# Patient Record
Sex: Female | Born: 1945
Health system: Southern US, Community
[De-identification: ages and names within clinical notes are randomized; demographics above are authoritative.]

## PROBLEM LIST (undated history)

## (undated) DIAGNOSIS — T7840XA Allergy, unspecified, initial encounter: Secondary | ICD-10-CM

## (undated) DIAGNOSIS — M722 Plantar fascial fibromatosis: Secondary | ICD-10-CM

## (undated) DIAGNOSIS — L410 Pityriasis lichenoides et varioliformis acuta: Secondary | ICD-10-CM

## (undated) DIAGNOSIS — E785 Hyperlipidemia, unspecified: Secondary | ICD-10-CM

## (undated) DIAGNOSIS — F17200 Nicotine dependence, unspecified, uncomplicated: Secondary | ICD-10-CM

## (undated) DIAGNOSIS — I10 Essential (primary) hypertension: Secondary | ICD-10-CM

## (undated) DIAGNOSIS — M1990 Unspecified osteoarthritis, unspecified site: Secondary | ICD-10-CM

## (undated) DIAGNOSIS — I499 Cardiac arrhythmia, unspecified: Secondary | ICD-10-CM

## (undated) HISTORY — DX: Unspecified osteoarthritis, unspecified site: M19.90

## (undated) HISTORY — DX: Nicotine dependence, unspecified, uncomplicated: F17.200

## (undated) HISTORY — DX: Hyperlipidemia, unspecified: E78.5

## (undated) HISTORY — DX: Plantar fascial fibromatosis: M72.2

## (undated) HISTORY — DX: Allergy, unspecified, initial encounter: T78.40XA

## (undated) HISTORY — PX: WISDOM TOOTH EXTRACTION: SHX21

## (undated) HISTORY — DX: Essential (primary) hypertension: I10

## (undated) HISTORY — DX: Pityriasis lichenoides et varioliformis acuta: L41.0

## (undated) HISTORY — PX: ABDOMINAL HYSTERECTOMY: SHX81

---

## 2001-10-03 HISTORY — PX: CHOLECYSTECTOMY: SHX55

## 2008-10-03 HISTORY — PX: COLONOSCOPY: SHX174

## 2011-06-14 ENCOUNTER — Ambulatory Visit (INDEPENDENT_AMBULATORY_CARE_PROVIDER_SITE_OTHER): Payer: BC Managed Care – PPO | Admitting: Medical

## 2011-06-14 ENCOUNTER — Encounter: Payer: Self-pay | Admitting: Medical

## 2011-06-14 VITALS — BP 140/80 | HR 60 | Temp 97.9°F | Resp 20 | Ht 66.0 in | Wt 158.5 lb

## 2011-06-14 DIAGNOSIS — F17200 Nicotine dependence, unspecified, uncomplicated: Secondary | ICD-10-CM

## 2011-06-14 DIAGNOSIS — J40 Bronchitis, not specified as acute or chronic: Secondary | ICD-10-CM

## 2011-06-14 MED ORDER — VARENICLINE TARTRATE 0.5 MG PO TABS: 0.5000 mg | ORAL_TABLET | Freq: Two times a day (BID) | ORAL | Status: DC

## 2011-06-14 MED ORDER — DOXYCYCLINE HYCLATE 100 MG PO TABS: 100.0000 mg | ORAL_TABLET | Freq: Two times a day (BID) | ORAL | Status: AC

## 2011-06-14 NOTE — Patient Instructions (Signed)
Rest, hydrate well, consider OTC Mucinex DM or Coricidin HBP for cough and congestion.  I will have you begin Doxycyline antibiotic, 1 tablet twice daily for 10 days.   Call or return if not improving in 3-5 days.  I would like you to start Chantix to help stop smoking.  Pick a quit date, for example 1 week from now.  Begin Chantix, try and stop smoking on the quit date.  I also recommend you call 1-800-QUIT-NOW hotline which is a free 24/7 counseling service to help stop tobacco.    I want to see you back in 1 month regarding tobacco use.

## 2011-06-14 NOTE — Progress Notes (Signed)
Subjective:     Tonya [name] is a 65 y.o. female who presents for evaluation of chills, myalgias, nasal congestion, productive cough and sneezing.   Onset of symptoms was 5 days ago, and has been gradually worsening since that time. Treatment to date: alka seltzer.  They note hx/o 1-2 colds yearly that usually turn into bronchitis.  Denies sick contacts.  She has used Nasonex in past for allergies.  Denies hx/o COPD or asthma.  No other aggravating or relieving factors.  No other c/o.  The following portions of the patient's history were reviewed and updated as appropriate: allergies, current medications, past family history, past medical history, past social history, past surgical history and problem list.  Past Medical History  Diagnosis Date  . Hypertension   . Tobacco use disorder   . Allergy   . Wears glasses   . Osteoarthritis     knee, hips  . [name] disease     per her report from skin biopsy prior    Review of Systems Constitutional: denies fever, sweats, anorexia Skin: denies rash HEENT:  denies ear pain, itchy watery eyes Cardiovascular: denies chest pain, palpitations Lungs: +mild SOB, +productive sputum; denies wheezing, hemoptysis, orthopnea, PND Abdomen: denies abdominal pain, nausea, vomiting, diarrhea GU: denies dysuria Extremities: denies edema  Objective:   Filed Vitals:   06/14/11 1151  BP: 140/80  Pulse: 60  Temp: 97.9 F (36.6 C)  Resp: 20    General appearance: Alert, WD/WN, no distress,somewhat  ill appearing                             Skin: warm, no rash, no diaphoresis                           Head: no sinus tenderness                            Eyes: conjunctiva normal, corneas clear, PERRLA                            Ears: pearly TMs, external ear canals normal                          Nose: septum midline, turbinates swollen, with erythema and clear discharge             Mouth/throat: MMM, tongue normal, mild pharyngeal  erythema                           Neck: supple, no adenopathy, no thyromegaly, nontender                          Heart: RRR, normal S1, S2, no murmurs                         Lungs: +bronchial breath sounds, +scattered rhonchi, no wheezes, no rales                Extremities: no edema, nontender     Assessment:   Encounter Diagnoses  Name Primary?  . Bronchitis Yes  . Tobacco use disorder      Plan:   Prescription given today for Doxycycline as below.  Discussed  diagnosis and treatment of bronchitis.  Suggested symptomatic OTC remedies for cough and congestion.  Nasal saline spray for nasal congestion.  Tylenol or Ibuprofen OTC for fever and malaise.  Call/return in 2-3 days if symptoms are worse or not improving.  Advised that cough may linger even after the infection is improved.   Tobacco use disorder - she is motivated to quit.  Discussed risks of tobacco use.  She will begin Chantix and I strongly advised her to call toll free help line.  Recheck 57mo.

## 2011-07-13 ENCOUNTER — Ambulatory Visit (INDEPENDENT_AMBULATORY_CARE_PROVIDER_SITE_OTHER): Payer: BC Managed Care – PPO | Admitting: Medical

## 2011-07-13 ENCOUNTER — Encounter: Payer: Self-pay | Admitting: Medical

## 2011-07-13 DIAGNOSIS — Z8249 Family history of ischemic heart disease and other diseases of the circulatory system: Secondary | ICD-10-CM

## 2011-07-13 DIAGNOSIS — F17200 Nicotine dependence, unspecified, uncomplicated: Secondary | ICD-10-CM

## 2011-07-13 DIAGNOSIS — R079 Chest pain, unspecified: Secondary | ICD-10-CM

## 2011-07-13 DIAGNOSIS — I10 Essential (primary) hypertension: Secondary | ICD-10-CM

## 2011-07-13 NOTE — Progress Notes (Signed)
Subjective:    Tonya [name] is a 65 y.o. female who presents for evaluation of chest pain. Onset was 3 days ago. Symptoms have improved since that time. The patient describes the pain as ache and does not radiate.    She went to the gym and walked on the treadmill for a short periods and did some low weight strength training.  Later than evening started having pain under left breast/chest wall.  Though this could be related to a tight sports bra. The pain lasted for hours that night, but since then has gotten brief twinges of the pain at random times.  Sometimes with twisting, sometimes lying on right side.  On Monday however, she didn't feel quite right and had some lightheadedness.    Aggravating factors are: twisting motion, leaning forward, lying on right side. Alleviating factors are: just resolves on its on, most of the time the pains are breif.. Patient's cardiac risk factors are: hypertension, smoking/ tobacco exposure and somewhat sedentary, and age is close to 47.Marland Kitchen Patient's risk factors for DVT/PE: none. Previous cardiac testing: none.  She wants to stop smoking, smokes 1ppd.  Couldn't afford Chantix written last visit.  She also wants referral to eye doctor.  She needs to find a new eye doctor.  The following portions of the patient's history were reviewed and updated as appropriate: allergies, current medications, past family history, past medical history, past social history, past surgical history and problem list.  Past Medical History  Diagnosis Date  . Hypertension   . Tobacco use disorder   . Allergy   . Wears glasses   . Osteoarthritis     knee, hips  . [name] disease     per her report from skin biopsy prior    Review of Systems Constitutional: denies fever, chills, sweats, unexpected weight change, anorexia, fatigue ENT: +runny nose,mild nasal congestion; no ear pain, sore throat Cardiology: denies palpitations, edema, orthopnea, paroxysmal nocturnal  dyspnea Respiratory: denies cough, shortness of breath, dyspnea on exertion, wheezing, hemoptysis Gastroenterology: denies abdominal pain, nausea, vomiting, diarrhea, constipation,  Musculoskeletal: denies arthralgias, myalgias, back pain Urology: denies dysuria Neurology: no headache, weakness, tingling, numbness, speech abnormality     Objective:      Filed Vitals:   07/13/11 1059  BP: 130/72  Pulse: 88  Temp: 98.1 F (36.7 C)  Resp: 18   General appearance: alert, no distress, WD/WN, black female  Skin: no erythema, ecchymosis HEENT: normocephalic, conjunctiva/corneas normal, sclerae anicteric, PERRLA, EOMi, nares patent, no discharge or erythema, pharynx normal Oral cavity: MMM, tongue normal, teeth normal Neck: supple, no lymphadenopathy, no thyromegaly, no JVD, no bruits, no masses, normal ROM Chest: non tender, normal shape and expansion Heart: RRR, normal S1, S2, no murmurs Lungs: CTA bilaterally, no wheezes, rhonchi, or rales Abdomen: +bs, soft, non tender, non distended, no masses, no hepatomegaly, no splenomegaly, no bruits Back: non tender Extremities: no edema, no cyanosis, no clubbing Pulses: 2+ symmetric, upper and lower extremities, normal cap refill Neurological: alert, oriented x 3, CN2-12 intact    Cardiographics ECG: with nsr, 60 bpm, normal intervals, axis -9 degrees, left axis deviation, good R wave progression, no acute changes, no ST changes, no prior to compare.    Imaging Chest x-ray: normal chest x-ray and pending review by radiologist   CXR is malrotated slightly, there is moderate thoracic degenerative changes, but no obvious cardiomegaly, mass, infiltrate or bony abnormality.   Assessment:   Encounter Diagnoses  Name Primary?  . Chest pain Yes  .  Essential hypertension, benign   . Tobacco use disorder   . Family history of hypertension      Plan:  Chest pain - Advised that symptoms suggest a musculoskeletal etiology, but baseline EKG  and CXR done given her hx/o tobacco use, HTN, and age.  CXR unremarkable other than arthritis of T spine.  EKG with no worrisome findings today.   Advised relative rest, stretching, OTC Aleve once to twice daily prn, heat pad if desired.  Advised that once this resolves, to begin back exercising within 2 weeks.  Discussed signs of acute coronary syndrome that would prompt urgent evaluation.    HTN - controlled on current medication  Tobacco use - advised she consider 1-800-QUIT-NOW for free counseling and nicotine replacement patch.  Advised she try and stop smoking.  Family hx/o HTN.  We will refer to ophthalmology.

## 2011-07-19 ENCOUNTER — Telehealth: Payer: Self-pay | Admitting: Medical

## 2011-07-19 NOTE — Telephone Encounter (Signed)
DONE

## 2011-07-22 ENCOUNTER — Telehealth: Payer: Self-pay | Admitting: Family Medicine

## 2011-07-22 NOTE — Telephone Encounter (Signed)
Message copied by Janeice Robinson on Fri Jul 22, 2011  1:26 PM ------      Message from: [place], DAVID S      Created: Wed Jul 20, 2011  5:05 PM       1) chest x-ray over read as normal.  See if she is feeling better.            2) once again recommend she call 1 800 quit now for smoking cessation            3) make sure we referred her to ophthalmology

## 2011-11-08 ENCOUNTER — Ambulatory Visit (INDEPENDENT_AMBULATORY_CARE_PROVIDER_SITE_OTHER): Payer: BC Managed Care – PPO | Admitting: Medical

## 2011-11-08 ENCOUNTER — Encounter: Payer: Self-pay | Admitting: Medical

## 2011-11-08 VITALS — BP 112/78 | HR 72 | Temp 98.2°F | Resp 16 | Wt 156.0 lb

## 2011-11-08 DIAGNOSIS — Z23 Encounter for immunization: Secondary | ICD-10-CM | POA: Insufficient documentation

## 2011-11-08 DIAGNOSIS — Z1211 Encounter for screening for malignant neoplasm of colon: Secondary | ICD-10-CM

## 2011-11-08 DIAGNOSIS — I10 Essential (primary) hypertension: Secondary | ICD-10-CM

## 2011-11-08 DIAGNOSIS — R0989 Other specified symptoms and signs involving the circulatory and respiratory systems: Secondary | ICD-10-CM

## 2011-11-08 DIAGNOSIS — R0683 Snoring: Secondary | ICD-10-CM | POA: Insufficient documentation

## 2011-11-08 DIAGNOSIS — G478 Other sleep disorders: Secondary | ICD-10-CM

## 2011-11-08 DIAGNOSIS — G479 Sleep disorder, unspecified: Secondary | ICD-10-CM | POA: Insufficient documentation

## 2011-11-08 DIAGNOSIS — Z1239 Encounter for other screening for malignant neoplasm of breast: Secondary | ICD-10-CM | POA: Insufficient documentation

## 2011-11-08 DIAGNOSIS — R0609 Other forms of dyspnea: Secondary | ICD-10-CM

## 2011-11-08 DIAGNOSIS — F17200 Nicotine dependence, unspecified, uncomplicated: Secondary | ICD-10-CM

## 2011-11-08 LAB — LIPID PANEL
HDL: 39 mg/dL — ABNORMAL LOW (ref >39)
LDL Cholesterol: 97 mg/dL (ref 0–99)
Total CHOL/HDL Ratio: 4 Ratio
Triglycerides: 93 mg/dL (ref <150)
VLDL: 19 mg/dL (ref 0–40)

## 2011-11-08 LAB — CBC WITH DIFFERENTIAL/PLATELET
Basophils Absolute: 0 10*3/uL (ref 0.0–0.1)
Eosinophils Relative: 2 % (ref 0–5)
HCT: 43.2 % (ref 36.0–46.0)
Hemoglobin: 14 g/dL (ref 12.0–15.0)
Lymphocytes Relative: 31 % (ref 12–46)
Lymphs Abs: 3 10*3/uL (ref 0.7–4.0)
MCV: 96.4 fL (ref 78.0–100.0)
Monocytes Absolute: 0.6 10*3/uL (ref 0.1–1.0)
Monocytes Relative: 6 % (ref 3–12)
Neutro Abs: 5.8 10*3/uL (ref 1.7–7.7)
RBC: 4.48 MIL/uL (ref 3.87–5.11)
RDW: 13.8 % (ref 11.5–15.5)
WBC: 9.5 10*3/uL (ref 4.0–10.5)

## 2011-11-08 LAB — POCT URINALYSIS DIPSTICK
Bilirubin, UA: NEGATIVE
Blood, UA: NEGATIVE
Leukocytes, UA: NEGATIVE
Nitrite, UA: NEGATIVE
Protein, UA: NEGATIVE
pH, UA: 5

## 2011-11-08 LAB — COMPREHENSIVE METABOLIC PANEL
Alkaline Phosphatase: 50 U/L (ref 39–117)
BUN: 19 mg/dL (ref 6–23)
Creat: 0.71 mg/dL (ref 0.50–1.10)
Glucose, Bld: 106 mg/dL — ABNORMAL HIGH (ref 70–99)
Total Bilirubin: 0.7 mg/dL (ref 0.3–1.2)

## 2011-11-08 MED ORDER — INFLUENZA VIRUS VACCINE SPLIT IM INJ: 0.5000 mL | INJECTION | Freq: Once | INTRAMUSCULAR | Status: DC

## 2011-11-08 MED ORDER — VALSARTAN-HYDROCHLOROTHIAZIDE 320-12.5 MG PO TABS: 1.0000 | ORAL_TABLET | Freq: Every day | ORAL | Status: DC

## 2011-11-08 MED ORDER — INFLUENZA VIRUS VACC SPLIT PF IM SUSP: 0.5000 mL | INTRAMUSCULAR | Status: AC

## 2011-11-08 NOTE — Progress Notes (Signed)
Subjective:   HPI  Tonya [name] is a 66 y.o. female who presents for routine f/u and med check.  She needs refills on her BP medication.  She is fasting for labs today.  She has been compliant with medication, BPs been running normal, no c/o.  She is still smoking, hasn't called the 1-800-QUIT-Now hotline, not ready to quit.  She recently received notice from her prior gastroenterologist that she is due for repeat colonoscopy.  Last colonoscopy about 3 years ago, but due for f/u given hx/o polyps.   Needs referral to GI here in town.  She would also like referral to gynecology here locally for pap smear, mammogram, routine gyn screening.  Prefers gynecology.  She notes concern for sleep apnea.  Her relatives that stay with her say she snores a lot.  She denies rested feeling in the mornings, and does get some day time somnolence.  She will wake her self sometimes from sleep.  Wants sleep study.  No other aggravating or relieving factors.    No other c/o.  The following portions of the patient's history were reviewed and updated as appropriate: allergies, current medications, past family history, past medical history, past social history, past surgical history and problem list.  Past Medical History  Diagnosis Date  . Hypertension   . Tobacco use disorder   . Allergy   . Wears glasses   . Osteoarthritis     knee, hips  . [name] disease     per her report from skin biopsy prior    Allergies  Allergen Reactions  . Penicillins     No current outpatient prescriptions on file prior to visit.   No current facility-administered medications on file prior to visit.    Review of Systems Constitutional: denies fever, chills, sweats, unexpected weight change, fatigue ENT: no runny nose, ear pain, sore throat Cardiology: denies chest pain, palpitations, edema Respiratory: denies cough, shortness of breath, wheezing Gastroenterology: denies abdominal pain, nausea, vomiting, diarrhea,  constipation  Urology: denies dysuria, difficulty urinating, hematuria, urinary frequency, urgency Neurology: no headache, weakness, tingling, numbness      Objective:   Physical Exam  General appearance: alert, no distress, WD/WN HEENT: normocephalic, sclerae anicteric, TMs pearly, nares patent, no discharge or erythema, pharynx normal Oral cavity: MMM, no lesions Neck: supple, no lymphadenopathy, no thyromegaly, no masses Heart: RRR, normal S1, S2, no murmurs Lungs: CTA bilaterally, no wheezes, rhonchi, or rales Abdomen: +bs, soft, non tender, non distended, no masses, no hepatomegaly, no splenomegaly Pulses: 2+ symmetric, upper and lower extremities, normal cap refill   Assessment and Plan :     Encounter Diagnoses  Name Primary?  . Essential hypertension, benign Yes  . Need for prophylactic vaccination and inoculation against influenza   . Tobacco use disorder   . Snoring   . Sleep disturbance    HTN - controlled on current medication.  Refills today, labs today.  Flu vaccine, VIS and vaccine counseling today.  Tobacco use disorder - not ready to quit.  Counseling today.  Snoring, sleep disturbance - discussed screening/testing.  She agrees to in home screen instead of formal sleep study.  Referral for [facility] for in home screen for sleep apnea.   Referrals to gynecology for routine pap/mammogram, referral today for repeat screening colonoscopy.

## 2011-11-18 ENCOUNTER — Other Ambulatory Visit: Payer: Self-pay | Admitting: Obstetrics and Gynecology

## 2011-11-18 DIAGNOSIS — Z1231 Encounter for screening mammogram for malignant neoplasm of breast: Secondary | ICD-10-CM

## 2011-12-06 ENCOUNTER — Ambulatory Visit
Admission: RE | Admit: 2011-12-06 | Discharge: 2011-12-06 | Disposition: A | Discharge status: Home / Self Care | Payer: BC Managed Care – PPO | Source: Ambulatory Visit | Attending: Obstetrics and Gynecology | Admitting: Obstetrics and Gynecology

## 2011-12-06 DIAGNOSIS — Z1231 Encounter for screening mammogram for malignant neoplasm of breast: Secondary | ICD-10-CM

## 2011-12-08 ENCOUNTER — Other Ambulatory Visit: Payer: Self-pay | Admitting: Obstetrics and Gynecology

## 2011-12-08 DIAGNOSIS — R928 Other abnormal and inconclusive findings on diagnostic imaging of breast: Secondary | ICD-10-CM

## 2011-12-12 ENCOUNTER — Telehealth: Payer: Self-pay | Admitting: Family Medicine

## 2011-12-12 NOTE — Telephone Encounter (Signed)
PATIENT WAS NOTIFIED THAT SHE NEEDS TO COME BY FOR AN OV TO DISCUSS SLEEP STUDY. PATIENT SCHEDULED HER APPOINTMENT AND WILL BE SEEN NEXT WEEK. CLS

## 2011-12-12 NOTE — Telephone Encounter (Signed)
Message copied by Janeice Robinson on Mon Dec 12, 2011  2:51 PM ------      Message from: Jac Canavan      Created: Mon Dec 12, 2011  2:34 PM       Overnight in home sleep study was abnormal.  Lets have her f/u OV to discuss, and to check A1C due to recent glucose being elevated.

## 2011-12-15 ENCOUNTER — Ambulatory Visit
Admission: RE | Admit: 2011-12-15 | Discharge: 2011-12-15 | Disposition: A | Discharge status: Home / Self Care | Payer: BC Managed Care – PPO | Source: Ambulatory Visit | Attending: Obstetrics and Gynecology | Admitting: Obstetrics and Gynecology

## 2011-12-15 DIAGNOSIS — R928 Other abnormal and inconclusive findings on diagnostic imaging of breast: Secondary | ICD-10-CM

## 2011-12-20 ENCOUNTER — Encounter: Payer: Self-pay | Admitting: Medical

## 2011-12-20 ENCOUNTER — Ambulatory Visit (INDEPENDENT_AMBULATORY_CARE_PROVIDER_SITE_OTHER): Payer: BC Managed Care – PPO | Admitting: Medical

## 2011-12-20 VITALS — BP 130/80 | HR 76 | Temp 98.0°F | Resp 16 | Wt 161.0 lb

## 2011-12-20 DIAGNOSIS — G4730 Sleep apnea, unspecified: Secondary | ICD-10-CM

## 2011-12-20 DIAGNOSIS — M1990 Unspecified osteoarthritis, unspecified site: Secondary | ICD-10-CM

## 2011-12-20 NOTE — Patient Instructions (Signed)
Joint pains -  Begin OTC Acetaminophen/Tylenol extra strength 2-3 times daily or Aleve once to twice daily for arthritis pain.   You can also consider Capsaicin cream topically for the knees and hips.

## 2011-12-20 NOTE — Progress Notes (Signed)
Subjective:   HPI  Tonya [name] is a 66 y.o. female who presents for recheck.  I saw her recently for several issues.  She is here primarily for f/u on in home overnight sleep study.  She did recently have an abnormal mammogram, but on recheck in comparison to old mammograms, she knows the diagnosis was fibrocystic breast disease and nothing worrisome.  She does report loud snoring, sometimes does not wake rested, has some daytime somnolence.  She also complains of right knee and hip arthritis.  She has been on Celebrex in the past, wants my opinion what she can take for this.  No other aggravating or relieving factors.    No other c/o.  The following portions of the patient's history were reviewed and updated as appropriate: allergies, current medications, past family history, past medical history, past social history, past surgical history and problem list.  Past Medical History  Diagnosis Date  . Hypertension   . Tobacco use disorder   . Allergy   . Wears glasses   . Osteoarthritis     knee, hips  . [name] disease     per her report from skin biopsy prior    Allergies  Allergen Reactions  . Penicillins      Review of Systems ROS reviewed and was negative other than noted in HPI or above.    Objective:   Physical Exam  General appearance: alert, no distress, WD/WN Oral cavity: MMM, no lesions Neck: supple, no lymphadenopathy, no thyromegaly, no masses Heart: RRR, normal S1, S2, no murmurs Lungs: CTA bilaterally, no wheezes, rhonchi, or rales Pulses: 2+ symmetric   Assessment and Plan :     Encounter Diagnoses  Name Primary?  . Sleep apnea Yes  . Osteoarthritis    Sleep apnea - we reviewed her recent in home sleep study results which shows several events of hypoxia. Epworth sleep scale-8.   We will set her back up with [facility] home health for CPAP titration and supplies to begin using CPAP.  I also recommend she try lose a little weight, and we once again  discussed sleep hygiene.  Osteoarthritis-she will try over-the-counter Aleve or, arthritis when necessary, preferably not daily.  She can also consider over-the-counter capsaicin topically  Recheck 67mo.

## 2012-01-25 ENCOUNTER — Encounter (HOSPITAL_BASED_OUTPATIENT_CLINIC_OR_DEPARTMENT_OTHER): Payer: BC Managed Care – PPO

## 2012-02-03 ENCOUNTER — Ambulatory Visit (HOSPITAL_BASED_OUTPATIENT_CLINIC_OR_DEPARTMENT_OTHER): Payer: BC Managed Care – PPO | Attending: Medical

## 2012-02-03 VITALS — Ht 66.0 in | Wt 160.0 lb

## 2012-02-03 DIAGNOSIS — G4733 Obstructive sleep apnea (adult) (pediatric): Secondary | ICD-10-CM | POA: Insufficient documentation

## 2012-02-12 DIAGNOSIS — G4733 Obstructive sleep apnea (adult) (pediatric): Secondary | ICD-10-CM

## 2012-02-12 NOTE — Procedures (Signed)
NAMEJAYONA, MCCAIG              ACCOUNT NO.:  [id number]  MEDICAL RECORD NO.:  [id number]          PATIENT TYPE:  OUT  LOCATION:  SLEEP CENTER                 FACILITY:  [facility]  PHYSICIAN:  Shianna Bally D. Maple Hudson, MD, FCCP, FACPDATE OF BIRTH:  05[birth year]  DATE OF STUDY:  02/03/2012                           NOCTURNAL POLYSOMNOGRAM  REFERRING PHYSICIAN:  Crosby Oyster, PA  REFERRING:  Crosby Oyster, PA  INDICATION FOR STUDY:  Hypersomnia with sleep apnea.  EPWORTH SLEEPINESS SCORE:  5/24.  BMI 25.8, weight 160 pounds, height 66 inches, and neck 14 inches.  MEDICATIONS:  Home medications are charted and reviewed.  SLEEP ARCHITECTURE:  Total sleep time 319 minutes with sleep efficiency 72.3%.  Stage I was 7.2%, stage II 71.8%, stage III absent, and REM 21% of total sleep time.  Sleep latency 38.5 minutes, REM latency 238.5 minutes, awake after sleep onset 83.5 minutes, and arousal index 19.4.  Bedtime medication:  None.  RESPIRATORY DATA:  Apnea-hypopnea index (AHI) 7.3 per hour.  A total of 39 events were scored including 11 obstructive apneas, 2 central apneas, and 26 hypopneas.  Events were not positional.  REM AHI 10.7 per hour. There were insufficient numbers of events to meet protocol requirements for initiation of split protocol CPAP titration on this study night.  OXYGEN DATA:  Moderate snoring with oxygen desaturation to a nadir of 86% and mean oxygen saturation through the study of 92% on room air.  CARDIAC DATA:  Normal sinus rhythm.  MOVEMENT-PARASOMNIA:  No significant movement disturbance.  Bathroom x1.  IMPRESSIONS-RECOMMENDATION: 1. Mild obstructive sleep apnea/hypopnea syndrome, apnea-hypopnea     index 7.3 per hour with non-positional events.  Moderate snoring     with oxygen desaturation to a nadir of 86% and mean oxygen     saturation through the study of 92% on room air. 2. There were insufficient numbers of events to meet protocol     requirements for  initiation of split protocol continuous positive     airway pressure titration on this study night.  Scores in this     range are usually managed     with conservative therapy including attention to nasal and upper     airway anatomic obstruction if appropriate.  Return for dedicated     CPAP titration study can be done if clinically necessary.     Prisma Decarlo D. Maple Hudson, MD, [facility], FACP Diplomate, American Board of Sleep Medicine    CDY/MEDQ  D:  02/12/2012 08:52:11  T:  02/12/2012 10:13:51  Job:  [id number]

## 2012-02-29 ENCOUNTER — Ambulatory Visit (INDEPENDENT_AMBULATORY_CARE_PROVIDER_SITE_OTHER): Payer: BC Managed Care – PPO | Admitting: Medical

## 2012-02-29 ENCOUNTER — Encounter: Payer: Self-pay | Admitting: Medical

## 2012-02-29 VITALS — BP 130/80 | HR 60 | Temp 98.2°F | Resp 16 | Wt 162.0 lb

## 2012-02-29 DIAGNOSIS — I10 Essential (primary) hypertension: Secondary | ICD-10-CM

## 2012-03-02 ENCOUNTER — Ambulatory Visit (INDEPENDENT_AMBULATORY_CARE_PROVIDER_SITE_OTHER): Payer: BC Managed Care – PPO | Admitting: Medical

## 2012-03-02 ENCOUNTER — Encounter: Payer: Self-pay | Admitting: Medical

## 2012-03-02 VITALS — BP 130/80 | HR 60 | Temp 101.3°F | Resp 16 | Wt 162.0 lb

## 2012-03-02 DIAGNOSIS — J189 Pneumonia, unspecified organism: Secondary | ICD-10-CM

## 2012-03-02 DIAGNOSIS — R52 Pain, unspecified: Secondary | ICD-10-CM

## 2012-03-02 DIAGNOSIS — G4730 Sleep apnea, unspecified: Secondary | ICD-10-CM

## 2012-03-02 DIAGNOSIS — R05 Cough: Secondary | ICD-10-CM

## 2012-03-02 DIAGNOSIS — M25571 Pain in right ankle and joints of right foot: Secondary | ICD-10-CM

## 2012-03-02 DIAGNOSIS — R059 Cough, unspecified: Secondary | ICD-10-CM

## 2012-03-02 DIAGNOSIS — R509 Fever, unspecified: Secondary | ICD-10-CM

## 2012-03-02 DIAGNOSIS — M25579 Pain in unspecified ankle and joints of unspecified foot: Secondary | ICD-10-CM

## 2012-03-02 LAB — CBC WITH DIFFERENTIAL/PLATELET
HCT: 39.5 % (ref 36.0–46.0)
Hemoglobin: 12.7 g/dL (ref 12.0–15.0)
Lymphocytes Relative: 16 % (ref 12–46)
Lymphs Abs: 2.3 10*3/uL (ref 0.7–4.0)
MCV: 96 fL (ref 78.0–100.0)
Monocytes Absolute: 0.7 10*3/uL (ref 0.1–1.0)
Monocytes Relative: 5 % (ref 3–12)
Neutro Abs: 11.1 10*3/uL — ABNORMAL HIGH (ref 1.7–7.7)
RBC: 4.11 MIL/uL (ref 3.87–5.11)
WBC: 14.1 10*3/uL — ABNORMAL HIGH (ref 4.0–10.5)

## 2012-03-02 MED ORDER — LEVOFLOXACIN 500 MG PO TABS: 500.0000 mg | ORAL_TABLET | Freq: Every day | ORAL | Status: AC

## 2012-03-02 NOTE — Progress Notes (Signed)
  Subjective:   HPI  Tonya [name] is a 66 y.o. female who presents for multiple c/o.  She originally came in Wednesday with ankle c/o only, but rescheduled for today given time restraints Wednesday.   She repots turning right ankle a week or so ago while at hte gym, but in the last few days has felt much better. Still a little sore on the inside of the ankle at the bone, but no swelling.  Wants to know when she can return to exercise.  She has had a cold the last week with cough, sinus pressure, but in the last 24 hours notes aching all over, very tired, feels sick.  Denies sick contacts, no fever, no sore throat, ear pain, NVD, wheezing.  Feels flu like.   She is also here in f/u from her recent sleep study.    No other c/o.  The following portions of the patient's history were reviewed and updated as appropriate: allergies, current medications, past family history, past medical history, past social history, past surgical history and problem list.  Past Medical History  Diagnosis Date  . Hypertension   . Tobacco use disorder   . Allergy   . Wears glasses   . Osteoarthritis     knee, hips  . [name] disease     per her report from skin biopsy prior    Allergies  Allergen Reactions  . Penicillins      Review of Systems ROS reviewed and was negative other than noted in HPI or above.    Objective:   Physical Exam  General appearance: alert, no distress, WD/WN, ill appearing Skin: hot to touch HEENT: normocephalic, sclerae anicteric, TMs pearly, nares patent, no discharge or erythema, pharynx normal Oral cavity: MMM, no lesions Neck: supple, no lymphadenopathy, no thyromegaly, no masses Heart: RRR, normal S1, S2, no murmurs Lungs: decreased breath sounds in general, no obvious wheezes, rhonchi, or rales Abdomen: +bs, soft, non tender, non distended, no masses, no hepatomegaly, no splenomegaly MSK: right ankle nontender, normal ROM, no obvious deformity or  swelling Pulses: 2+ symmetric, upper and lower extremities, normal cap refill Neuro: ankle intact   Assessment and Plan :     Encounter Diagnoses  Name Primary?  . Pneumonia Yes  . Fever   . Body aches   . Cough   . Ankle pain, right   . Sleep apnea    Pneumonia - CXR today suggestive of lobar pneumonia right upper and lower lobes.  CXR reviewed by Dr. Susann Givens as well, supervising physician. Discussed inpatient vs outpatient treatment.  She would rather try outpatient therapy.  Of note, she takes care of her young granddaughter.  We will treat with Levaquin, rest, good hydration, and f/u 1wk.  However, if worse in the meantime, go to the hospital.  She will have close by family keep an eye on her this weekend.  Ankle pain - resolving sprain.  Advised she gradually resume physical activity and her exercise regimen once the pneumonia has resolved and we have recheck here in 1-2 wk.  Sleep apnea - discussed the recent sleep study which showed only mild apnea.  She will consider options for therapy.  We discussed options including mouthguard, CPAP, raising head of the bed.    RTC 1wk.

## 2012-03-02 NOTE — Patient Instructions (Signed)
Sleep Apnea Sleep apnea is a common disorder. The main problem of this disorder is excessive daytime sleepiness and compromised quality of life. This may include social and emotional problems. There are two types of sleep apnea.  Obstructive sleep apnea is when breathing stops due to a blocked airway.   Central sleep apnea is a malfunction of the brain's normal signal to breathe.  SYMPTOMS  Restless sleep.   Falling asleep while driving and/or during the day.   Loss of energy.   Irritability.   Mood or behavior changes.   Loud, heavy snoring.   Morning headaches.   Trouble concentrating.   Forgetfulness.   Anxiety or depression.   Decreased interest in sex.  Not all people with sleep apnea have all of these symptoms. However, people who have a few of these symptoms should visit their caregiver for an evaluation. Problems related to untreated sleep apnea include:  High blood pressure (hypertension).   Coronary artery disease.   Impotence.   Cognitive dysfunction.   Memory loss.  TREATMENT  For mild cases, treatment may include avoiding sleeping on one's back.   For people with nasal congestion, a decongestant may be prescribed.   Patients with obstructive and central apnea should avoid depressants. This includes alcohol, sedatives and narcotics. Weight loss and diet control are encouraged for overweight patients.   Many serious cases of obstructive sleep apnea can be relieved by a treatment called nasal continuous positive airway pressure (nasal CPAP). Nasal CPAP uses a mask-like device and pump that work together to keep the airway open. The pump delivers air pressure during each breath.   Surgery may help some patients by stopping or reducing the narrowing of the airway due to anatomical defects.  PROGNOSIS  Removing the obstruction usually reverses hypertension and cardiac problems. Untreated, sleep apnea sufferers have a tendency to fall asleep during the day.  This is can result in serious accident or loss of ones job. RESEARCH Sleep apnea is currently one of the most active areas of sleep research.  Document Released: 09/09/2002 Document Revised: 09/08/2011 Document Reviewed: 01/05/2006 [employer] Patient Information 2012 [employer]. 

## 2012-03-13 NOTE — Progress Notes (Signed)
Document opened in error, no visit.

## 2012-03-16 ENCOUNTER — Ambulatory Visit (INDEPENDENT_AMBULATORY_CARE_PROVIDER_SITE_OTHER): Payer: BC Managed Care – PPO | Admitting: Medical

## 2012-03-16 ENCOUNTER — Encounter: Payer: Self-pay | Admitting: Medical

## 2012-03-16 VITALS — BP 110/68 | HR 64 | Temp 98.2°F | Resp 16 | Wt 158.0 lb

## 2012-03-16 DIAGNOSIS — M899 Disorder of bone, unspecified: Secondary | ICD-10-CM

## 2012-03-16 DIAGNOSIS — M2550 Pain in unspecified joint: Secondary | ICD-10-CM

## 2012-03-16 DIAGNOSIS — J189 Pneumonia, unspecified organism: Secondary | ICD-10-CM

## 2012-03-16 DIAGNOSIS — M8580 Other specified disorders of bone density and structure, unspecified site: Secondary | ICD-10-CM

## 2012-03-16 DIAGNOSIS — R918 Other nonspecific abnormal finding of lung field: Secondary | ICD-10-CM

## 2012-03-16 DIAGNOSIS — F17200 Nicotine dependence, unspecified, uncomplicated: Secondary | ICD-10-CM

## 2012-03-16 DIAGNOSIS — R9389 Abnormal findings on diagnostic imaging of other specified body structures: Secondary | ICD-10-CM

## 2012-03-16 MED ORDER — NAPROXEN SODIUM ER 375 MG PO TB24: ORAL_TABLET | ORAL | Status: DC

## 2012-03-16 NOTE — Progress Notes (Signed)
Subjective: Here for recheck.  I saw her about 2 wk ago for pneumonia.  She notes that she took the antibiotic and after about 4 days felt significantly better.  However, she then started feeling worse again.  Started having allergy symptoms, runny nose, sneezing, occasional cough.  Denies fever, chills, sweats, NVD.  Not using anything for the symptoms.   She currently denies cough, SOB, wheezing, or chest pain.  After she had felt better day 4-5 of the antibiotic for pneumonia, she went out in the garden and was doing work around the house.  This aggravated her knees.  She notes  Hx/o arthritis in her knees and hands.  Currently they have been giving her trouble.  She has tried numerous OTC NSAIDs including ibuprofen, Tylenol.   She is here for f/u on the ponunmoian and abnormal xray.  No other new c/o.   Past Medical History  Diagnosis Date  . Hypertension   . Tobacco use disorder   . Allergy   . Wears glasses   . Osteoarthritis     knee, hips  . [name] disease     per her report from skin biopsy prior     Objective:   Physical Exam  Filed Vitals:   03/16/12 1032  BP: 110/68  Pulse: 64  Temp: 98.2 F (36.8 C)  Resp: 16    General appearance: alert, no distress, WD/WN, lean black female HEENT: normocephalic, sclerae anicteric, TMs pearly, nares patent, no discharge or erythema, pharynx normal Oral cavity: MMM, no lesions Neck: supple, no lymphadenopathy, no thyromegaly, no masses Heart: RRR, normal S1, S2, no murmurs Lungs: CTA bilaterally, no wheezes, rhonchi, or rales Abdomen: +bs, soft, non tender, non distended, no masses, no hepatomegaly, no splenomegaly MSK: nontender UE and LE,no obvious deformity, normal ROM Pulses: 2+ symmetric, upper and lower extremities, normal cap refill   Assessment and Plan :    Encounter Diagnoses  Name Primary?  . Pneumonia Yes  . Osteopenia   . Abnormal chest x-ray   . Tobacco use disorder   . Joint pain    Pneumonia -  much improved.  Osteopenia - noted on recent CXR.   Will set up for bone density scan.  Advised OTC Ca+ Vit D.  Abnormal Chest Xray - repeat in 2 wk.  Order given  Tobacco use disorder - recommended 1-800-QUIT-now, use efforts to stop, consider medications.  Joint pain - trial of Naprelan once daily prn, relative rest.

## 2012-03-16 NOTE — Patient Instructions (Addendum)
Begin 81mg  Aspirin nightly for heart protection and arthritis pain  If needed, you can try the Naprelan 375mg  once daily pill I gave as samples today for arthritis pain.  We will set up an appointment for bone density scan.   Go for repeat chest xray in 2 weeks.   YOU CAN QUIT SMOKING!  Talk to your medical provider about using medicines to help you quit. These include nicotine replacement gum, lozenges, or skin patches.  Consider calling 1-800-QUIT-NOW, a toll free 24/7 hotline with free counseling to help you quit.  If you are ready to quit smoking or are thinking about it, congratulations! You have chosen to help yourself be healthier and live longer! There are lots of different ways to quit smoking. Nicotine gum, nicotine patches, a nicotine inhaler, or nicotine nasal spray can help with physical craving. Hypnosis, support groups, and medicines help break the habit of smoking. TIPS TO GET OFF AND STAY OFF CIGARETTES  Learn to predict your moods. Do not let a bad situation be your excuse to have a cigarette. Some situations in your life might tempt you to have a cigarette.   Ask friends and co-workers not to smoke around you.   Make your home smoke-free.   Never have "just one" cigarette. It leads to wanting another and another. Remind yourself of your decision to quit.   On a card, make a list of your reasons for not smoking. Read it at least the same number of times a day as you have a cigarette. Tell yourself everyday, "I do not want to smoke. I choose not to smoke."   Ask someone at home or work to help you with your plan to quit smoking.   Have something planned after you eat or have a cup of coffee. Take a walk or get other exercise to perk you up. This will help to keep you from overeating.   Try a relaxation exercise to calm you down and decrease your stress. Remember, you may be tense and nervous the first two weeks after you quit. This will pass.   Find new activities to  keep your hands busy. Play with a pen, coin, or rubber band. Doodle or draw things on paper.   Brush your teeth right after eating. This will help cut down the craving for the taste of tobacco after meals. You can try mouthwash too.   Try gum, breath mints, or diet candy to keep something in your mouth.  IF YOU SMOKE AND WANT TO QUIT:  Do not stock up on cigarettes. Never buy a carton. Wait until one pack is finished before you buy another.   Never carry cigarettes with you at work or at home.   Keep cigarettes as far away from you as possible. Leave them with someone else.   Never carry matches or a lighter with you.   Ask yourself, "Do I need this cigarette or is this just a reflex?"   Bet with someone that you can quit. Put cigarette money in a piggy bank every morning. If you smoke, you give up the money. If you do not smoke, by the end of the week, you keep the money.   Keep trying. It takes 21 days to change a habit!  Document Released: 07/16/2009 Document Revised: 06/01/2011 Document Reviewed: 07/16/2009 [facility] Patient Information 2012 [place], [place].

## 2012-03-17 LAB — VITAMIN D 25 HYDROXY (VIT D DEFICIENCY, FRACTURES): Vit D, 25-Hydroxy: 39 ng/mL (ref 30–89)

## 2012-03-30 ENCOUNTER — Ambulatory Visit
Admission: RE | Admit: 2012-03-30 | Discharge: 2012-03-30 | Disposition: A | Discharge status: Home / Self Care | Payer: BC Managed Care – PPO | Source: Ambulatory Visit | Attending: Medical | Admitting: Medical

## 2012-03-30 DIAGNOSIS — F17200 Nicotine dependence, unspecified, uncomplicated: Secondary | ICD-10-CM

## 2012-03-30 DIAGNOSIS — R9389 Abnormal findings on diagnostic imaging of other specified body structures: Secondary | ICD-10-CM

## 2012-03-30 DIAGNOSIS — J189 Pneumonia, unspecified organism: Secondary | ICD-10-CM

## 2012-03-30 NOTE — Addendum Note (Signed)
Addended by: Janeice Robinson on: 03/30/2012 09:27 AM   Modules accepted: Orders

## 2012-04-02 ENCOUNTER — Ambulatory Visit
Admission: RE | Admit: 2012-04-02 | Discharge: 2012-04-02 | Disposition: A | Discharge status: Home / Self Care | Payer: BC Managed Care – PPO | Source: Ambulatory Visit | Attending: Medical | Admitting: Medical

## 2012-04-02 DIAGNOSIS — M8580 Other specified disorders of bone density and structure, unspecified site: Secondary | ICD-10-CM

## 2012-04-10 ENCOUNTER — Encounter: Payer: Self-pay | Admitting: Internal Medicine

## 2012-04-10 ENCOUNTER — Encounter: Payer: Self-pay | Admitting: Medical

## 2012-04-26 ENCOUNTER — Telehealth: Payer: Self-pay | Admitting: Medical

## 2012-04-27 ENCOUNTER — Other Ambulatory Visit: Payer: Self-pay | Admitting: Medical

## 2012-04-27 MED ORDER — NAPROXEN SODIUM ER 375 MG PO TB24: ORAL_TABLET | ORAL | Status: DC

## 2012-04-27 NOTE — Telephone Encounter (Signed)
LM

## 2012-05-01 ENCOUNTER — Other Ambulatory Visit: Payer: Self-pay | Admitting: Family Medicine

## 2012-05-01 MED ORDER — NAPROXEN SODIUM ER 375 MG PO TB24: ORAL_TABLET | ORAL | Status: AC

## 2012-06-19 ENCOUNTER — Encounter: Payer: Self-pay | Admitting: Medical

## 2012-06-26 ENCOUNTER — Other Ambulatory Visit: Payer: Self-pay | Admitting: Obstetrics and Gynecology

## 2012-10-05 ENCOUNTER — Encounter: Payer: Self-pay | Admitting: Medical

## 2012-10-05 ENCOUNTER — Ambulatory Visit (INDEPENDENT_AMBULATORY_CARE_PROVIDER_SITE_OTHER): Payer: Medicare HMO | Admitting: Medical

## 2012-10-05 VITALS — BP 130/70 | HR 60 | Temp 98.1°F | Resp 16 | Wt 163.0 lb

## 2012-10-05 DIAGNOSIS — K639 Disease of intestine, unspecified: Secondary | ICD-10-CM

## 2012-10-05 DIAGNOSIS — Z2911 Encounter for prophylactic immunotherapy for respiratory syncytial virus (RSV): Secondary | ICD-10-CM

## 2012-10-05 DIAGNOSIS — Z23 Encounter for immunization: Secondary | ICD-10-CM

## 2012-10-05 DIAGNOSIS — K5900 Constipation, unspecified: Secondary | ICD-10-CM

## 2012-10-05 NOTE — Patient Instructions (Signed)
To help your bowel movements, increase daily fiber.   It is recommended to get approximately 20 grams of fiber daily  Examples would be a daily fiber supplement of psyllium or chia or flaxseed added to a beverage or food daily  Also, increased whole grain consumption, green leafy vegetables, water intake, and fruits with skins such as apples and pears.  One other consideration if these measure don't help completely would be to add Miralax powder, 1 cap full daily.

## 2012-10-05 NOTE — Progress Notes (Signed)
Subjective: Here for bowel issue.  For the past few months having issues with loose bowels.  Started a few weeks before she went for colonoscopy. Saw Dr. Loreta Ave, ended up having some polyps.  No other issues.  She notes having about 4 BMs daily, mostly soft but somewhat loose stools daily.  No hard or painful bowel movements.  No blood or mucous in stool.   She doesn't drink a lot of water.  She drinks a lot of juice, apple juice, grape juice, some grains, decent amount of vegetables.    She also wants shingles vaccine.  No other c/o.  Past Medical History  Diagnosis Date  . Hypertension   . Tobacco use disorder   . Allergy   . Wears glasses   . Osteoarthritis     knee, hips  . [name] disease     per her report from skin biopsy prior   ROS as in subjective    Objective:   Physical Exam  Filed Vitals:   10/05/12 1030  BP: 130/70  Pulse: 60  Temp: 98.1 F (36.7 C)  Resp: 16    General appearance: alert, no distress, WD/WN Neck: supple, no lymphadenopathy, no thyromegaly, no masses Heart: RRR, normal S1, S2, no murmurs Lungs: CTA bilaterally, no wheezes, rhonchi, or rales Abdomen: +bs, soft, non tender, non distended, no masses, no hepatomegaly, no splenomegaly   Assessment and Plan :    Encounter Diagnoses  Name Primary?  . Constipation Yes  . Bowel trouble   . Need for shingles vaccine    Advised increased fiber intake.  Add daily fiber with dietary changes with grains, psyllium or flax, leafy vegetables, increase water intake, decrease sugar such as OTC juices but can make own juice without added juice.   Can consider OTC dietary fiber supplement.  Recheck in 36mo.    shingles vaccine, vaccine counseling and VIS given.  Advise she stop restasis eye drops for a few weeks at least while she develops antibodies to shingles  Follow-up 36mo

## 2012-10-10 ENCOUNTER — Telehealth: Payer: Self-pay | Admitting: Family Medicine

## 2012-10-10 NOTE — Telephone Encounter (Signed)
Patient had zostavax vaccine on 10/05/12. Her insurance will not cover this vaccine. Was this discussed with her prior too receiving the vaccine? ($320) For any "medicare" patient that wants this vaccine, they need to be given a script to take to a pharmacy that will give the vaccine and they are also able to file to their part D insurance. If they don't want to go to pharmacy, they MUST pay for the vaccine in full at time of injection and then they can submit claim to their part D carrier for reimbursement.    The patient came into the office on 10/05/12 requesting the shingles vaccine. She said that she had already spoke with the insurance company and they said they would cover the vaccine. I am not sure what is going on with them not covering this but at the time of her visit she requested the vaccine and stated that she had spoke with her insurance company and they would cover it. CLS

## 2012-11-07 ENCOUNTER — Encounter: Payer: Self-pay | Admitting: Medical

## 2012-11-07 ENCOUNTER — Ambulatory Visit (INDEPENDENT_AMBULATORY_CARE_PROVIDER_SITE_OTHER): Payer: Medicare HMO | Admitting: Medical

## 2012-11-07 VITALS — BP 130/70 | HR 64 | Temp 98.1°F | Resp 16 | Wt 161.0 lb

## 2012-11-07 DIAGNOSIS — K5289 Other specified noninfective gastroenteritis and colitis: Secondary | ICD-10-CM

## 2012-11-07 DIAGNOSIS — R197 Diarrhea, unspecified: Secondary | ICD-10-CM

## 2012-11-07 DIAGNOSIS — IMO0001 Reserved for inherently not codable concepts without codable children: Secondary | ICD-10-CM

## 2012-11-07 DIAGNOSIS — M791 Myalgia, unspecified site: Secondary | ICD-10-CM

## 2012-11-07 DIAGNOSIS — R112 Nausea with vomiting, unspecified: Secondary | ICD-10-CM

## 2012-11-07 DIAGNOSIS — K529 Noninfective gastroenteritis and colitis, unspecified: Secondary | ICD-10-CM

## 2012-11-07 MED ORDER — VARENICLINE TARTRATE 0.5 MG X 11 & 1 MG X 42 PO MISC: ORAL | Status: DC

## 2012-11-07 NOTE — Progress Notes (Signed)
Subjective:  Tonya [name] is a 67 y.o. female who presents for not feeling well.  Was in usual state of health until Sunday morning 3 days ago.  She notes Sunday, awoke and felt sick, vomited all day long, was sick all day with nausea, vomiting and diarrhea.  Water was all she could keep down.  granddaughter had similar symptoms the week before.   Felt tired, wanted to sleep the whole day.  No blood or mucous in stool.  Has felt stiff on her right side.  Wanted to come in and get checked out.  Monday felt stiff on right side from neck to waist, like she slept wrong.  Yesterday she had stiffness and ache in right arm muscle, throbbing at times, intermittent.  Today she feels fine other than tired feeling, but wanted to come in and get checked out.  At current no additional NVD, no abdominal pain, no other symptoms. No other aggravating or relieving factors.    No other c/o.  The following portions of the patient's history were reviewed and updated as appropriate: allergies, current medications, past family history, past medical history, past social history, past surgical history and problem list.  Review of Systems Constitutional: -fever, -chills, -sweats, -unexpected weight change ENT: -runny nose, -ear pain, -sore throat Cardiology:  -chest pain, -palpitations, -edema Respiratory: -cough, -shortness of breath, -wheezing Ophthalmology: -vision changes Urology: -dysuria, -difficulty urinating, -hematuria, -urinary frequency, -urgency Neurology: -headache, -weakness, -tingling, -numbness    Objective: Physical Exam  Vital signs reviewed  General appearance: alert, no distress, WD/WN HEENT: normocephalic, sclerae anicteric, conjunctiva pink and moist, TMs pearly, nares patent, no discharge or erythema, pharynx normal Oral cavity: MMM, no lesions Neck: supple, no lymphadenopathy, no thyromegaly, no masses, no bruits Heart: RRR, normal S1, S2, no murmurs Lungs: CTA bilaterally, no wheezes,  rhonchi, or rales Abdomen: +somewhat increased bs, soft, non tender, non distended, no masses, no hepatomegaly, no splenomegaly Pulses: 2+ radial pulses, 2+ pedal pulses, normal cap refill Ext: no edema MSK: nontender throughout, normal ROM UE and LE  Neuro: cn2-12 intact, A&O x 3, DTRs normal, romberg normal, no cerebellar signs, nonfocal exam   Assessment: Encounter Diagnoses  Name Primary?  . Nausea & vomiting Yes  . Diarrhea   . Gastroenteritis   . Myalgia     Plan: Advised that symptoms and exam suggest resolving gastroenteritis and no abnormal neuromuscular exam today.  Reassured.  Recent myalgias could be fluid loss with GI loss from vomiting/diarrhea in light of being on diuretic.  Advised increased water intake, rest, OTC anti emetic medication if needed.  Follow up soon for physical.  Last physical at about 1 year ago.

## 2012-11-08 ENCOUNTER — Other Ambulatory Visit: Payer: Self-pay | Admitting: Medical

## 2012-11-08 DIAGNOSIS — N6310 Unspecified lump in the right breast, unspecified quadrant: Secondary | ICD-10-CM

## 2012-12-14 ENCOUNTER — Other Ambulatory Visit: Payer: Self-pay | Admitting: Medical

## 2012-12-14 ENCOUNTER — Ambulatory Visit
Admission: RE | Admit: 2012-12-14 | Discharge: 2012-12-14 | Disposition: A | Discharge status: Home / Self Care | Payer: Medicare HMO | Source: Ambulatory Visit | Attending: Medical | Admitting: Medical

## 2012-12-14 DIAGNOSIS — N6310 Unspecified lump in the right breast, unspecified quadrant: Secondary | ICD-10-CM

## 2012-12-17 ENCOUNTER — Other Ambulatory Visit: Payer: Self-pay | Admitting: Medical

## 2012-12-17 ENCOUNTER — Ambulatory Visit
Admission: RE | Admit: 2012-12-17 | Discharge: 2012-12-17 | Disposition: A | Discharge status: Home / Self Care | Payer: Medicare HMO | Source: Ambulatory Visit | Attending: Medical | Admitting: Medical

## 2012-12-17 ENCOUNTER — Ambulatory Visit: Admission: RE | Admit: 2012-12-17 | Payer: Medicare HMO | Source: Ambulatory Visit

## 2012-12-17 DIAGNOSIS — N6310 Unspecified lump in the right breast, unspecified quadrant: Secondary | ICD-10-CM

## 2012-12-17 NOTE — Progress Notes (Signed)
Quick Note:  Patient with a right breast cyst that was not able to be completely aspirated needs to be contacted to be offered a referral to a general surgeon for further evaluation and management of her breast mass. Results forwarded to triage. Val Farnam, PA-C ______

## 2012-12-20 ENCOUNTER — Telehealth: Payer: Self-pay | Admitting: Obstetrics and Gynecology

## 2012-12-20 ENCOUNTER — Other Ambulatory Visit: Payer: Self-pay | Admitting: Internal Medicine

## 2012-12-20 NOTE — Telephone Encounter (Signed)
Message copied by Delon Sacramento on Thu Dec 20, 2012  9:04 AM ------      Message from: Henreitta Leber      Created: Mon Dec 17, 2012 12:11 PM       Patient with a right breast cyst that was not able to be completely aspirated needs to be contacted to be offered a referral to a general surgeon for further evaluation and management of her breast mass.  Results forwarded to triage.  POWELL,ELMIRA, PA-C ------

## 2012-12-20 NOTE — Telephone Encounter (Signed)
Tc to pt regarding msg per EP below.  Informed pt that EP was basing her recommendations off of a report that she had read.  Pt states that she has not yet had the aspiration done, scheduled for tomorrow 12/21/12.  Pt informed if general surgeon referral needed this office is able to to make the referral for her.

## 2012-12-21 ENCOUNTER — Other Ambulatory Visit (HOSPITAL_COMMUNITY)
Admission: RE | Admit: 2012-12-21 | Discharge: 2012-12-21 | Disposition: A | Discharge status: Home / Self Care | Payer: Medicare HMO | Source: Ambulatory Visit | Attending: Diagnostic Radiology | Admitting: Diagnostic Radiology

## 2012-12-21 ENCOUNTER — Ambulatory Visit
Admission: RE | Admit: 2012-12-21 | Discharge: 2012-12-21 | Disposition: A | Discharge status: Home / Self Care | Payer: Medicare HMO | Source: Ambulatory Visit | Attending: Medical | Admitting: Medical

## 2012-12-21 ENCOUNTER — Other Ambulatory Visit: Payer: Self-pay | Admitting: Medical

## 2012-12-21 DIAGNOSIS — N6310 Unspecified lump in the right breast, unspecified quadrant: Secondary | ICD-10-CM

## 2012-12-21 DIAGNOSIS — N6009 Solitary cyst of unspecified breast: Secondary | ICD-10-CM | POA: Insufficient documentation

## 2013-02-20 ENCOUNTER — Encounter: Payer: Self-pay | Admitting: Internal Medicine

## 2013-02-20 LAB — HM COLONOSCOPY

## 2013-03-07 ENCOUNTER — Encounter: Payer: Self-pay | Admitting: Medical

## 2013-04-30 ENCOUNTER — Ambulatory Visit (INDEPENDENT_AMBULATORY_CARE_PROVIDER_SITE_OTHER): Payer: Medicare HMO | Admitting: Medical

## 2013-04-30 ENCOUNTER — Encounter: Payer: Self-pay | Admitting: Medical

## 2013-04-30 ENCOUNTER — Telehealth: Payer: Self-pay | Admitting: Internal Medicine

## 2013-04-30 VITALS — BP 138/84 | HR 76 | Temp 98.1°F | Resp 16 | Wt 158.0 lb

## 2013-04-30 DIAGNOSIS — F17200 Nicotine dependence, unspecified, uncomplicated: Secondary | ICD-10-CM

## 2013-04-30 DIAGNOSIS — I10 Essential (primary) hypertension: Secondary | ICD-10-CM

## 2013-04-30 DIAGNOSIS — R002 Palpitations: Secondary | ICD-10-CM

## 2013-04-30 DIAGNOSIS — F430 Acute stress reaction: Secondary | ICD-10-CM

## 2013-04-30 MED ORDER — BUPROPION HCL ER (XL) 150 MG PO TB24: 150.0000 mg | ORAL_TABLET | Freq: Every day | ORAL | Status: DC

## 2013-04-30 NOTE — Progress Notes (Signed)
Subjective: Here for couple week hx/o heart palpitations.  Gets a heavy heart beat.  One night felt pressure, took 325mg  aspirin and this helped.  Has had about 3 episodes in the last few weeks.   Worse when upset, and her daughter coming around has caused her to be upset.  Occasionally feels skipped beat.   Chest pain only happened once, not with each episodes.  Denies associated nausea, sweats, SOB, no edema.  One of the palpitations episodes felt somewhat like she was going to pass out but didn't.  No other aggravating or relieving factors.  She is a smoker, trying to quit with e-cigarettes.   She denies prior stress test of heart.  Past Medical History  Diagnosis Date  . Hypertension   . Tobacco use disorder   . Allergy   . Wears glasses   . Osteoarthritis     knee, hips  . [name] disease     per her report from skin biopsy prior   ROS as in subjective  Objective: Filed Vitals:   04/30/13 1015  BP: 138/84  Pulse: 76  Temp: 98.1 F (36.7 C)  Resp: 16    General appearance: alert, no distress, WD/WN, pleasant AA female Neck: supple, no lymphadenopathy, no thyromegaly, no masses, no JVD Heart: possible click of valve S1 in right upper sternal border, otherwise RRR, normal S1, S2, no murmurs Lungs: CTA bilaterally, no wheezes, rhonchi, or rales Abdomen: +bs, soft, non tender, non distended, no masses, no hepatomegaly, no splenomegaly Pulses: 2+ symmetric, upper and lower extremities, normal cap refill Ext: no edema     Adult ECG Report  Indication: palpitations  Rate: 62bpm  Rhythm: normal sinus rhythm  QRS Axis: -46 degrees  PR Interval:  QRS Duration: 86ms  QTc:  Conduction Disturbances: left anterior fascicular block  Other Abnormalities: P wave suggests atrial enlargement, lead II  Patient's cardiac risk factors are: advanced age (older than 67 for men, 75 for women), hypertension, smoking/ tobacco exposure and low HDL.  EKG comparison: none  Narrative Interpretation: LAFB, atrial enlargement, left axis deviation    Assessment: Encounter Diagnoses  Name Primary?  . Palpitations Yes  . Tobacco use disorder   . Essential hypertension, benign   . Acute stress reaction      Plan: Palpitations - discussed stress reduction, relaxation, and if symptoms worsen, let me know Tobacco use - failed chantix.   Begin Wellbutrin, work on efforts to wean off tobacco HTN - has been controlled, c/t same medications Acute stress - discussed her symptoms, concerns.   Given her overall risk factors for heart disease - age 67yo female, HTN, tobacco use, low HDL, we will refer for baseline cardiology evaluation.   Follow-up pending labs, referral

## 2013-04-30 NOTE — Telephone Encounter (Signed)
Message copied by Joslyn Hy on Tue Apr 30, 2013 11:28 AM ------      Message from: Jac Canavan      Created: Tue Apr 30, 2013 11:26 AM       Refer to Dr. Viann Fish for baseline cardiac evaluation.   Send OV note from today, last labs from 2013 and labs from today, EKG, last CXR report. ------

## 2013-04-30 NOTE — Addendum Note (Signed)
Addended by: Barbette Or A on: 04/30/2013 11:57 AM   Modules accepted: Orders

## 2013-04-30 NOTE — Telephone Encounter (Signed)
Faxed over ov notes,labs from 2013, xray and ekg

## 2013-05-01 ENCOUNTER — Other Ambulatory Visit: Payer: Medicare HMO

## 2013-05-01 DIAGNOSIS — F430 Acute stress reaction: Secondary | ICD-10-CM

## 2013-05-01 DIAGNOSIS — I10 Essential (primary) hypertension: Secondary | ICD-10-CM

## 2013-05-01 DIAGNOSIS — F17200 Nicotine dependence, unspecified, uncomplicated: Secondary | ICD-10-CM

## 2013-05-01 DIAGNOSIS — R002 Palpitations: Secondary | ICD-10-CM

## 2013-05-01 LAB — COMPREHENSIVE METABOLIC PANEL
Alkaline Phosphatase: 54 U/L (ref 39–117)
BUN: 17 mg/dL (ref 6–23)
Glucose, Bld: 90 mg/dL (ref 70–99)
Sodium: 141 mEq/L (ref 135–145)
Total Bilirubin: 0.3 mg/dL (ref 0.3–1.2)

## 2013-05-01 LAB — CBC WITH DIFFERENTIAL/PLATELET
Eosinophils Absolute: 0.1 10*3/uL (ref 0.0–0.7)
Eosinophils Relative: 2 % (ref 0–5)
Hemoglobin: 13.5 g/dL (ref 12.0–15.0)
Lymphs Abs: 3.4 10*3/uL (ref 0.7–4.0)
MCH: 31.1 pg (ref 26.0–34.0)
MCV: 92.9 fL (ref 78.0–100.0)
Monocytes Relative: 6 % (ref 3–12)
RBC: 4.34 MIL/uL (ref 3.87–5.11)

## 2013-05-09 ENCOUNTER — Encounter: Payer: Self-pay | Admitting: Cardiology

## 2013-05-09 NOTE — Progress Notes (Unsigned)
Patient ID: Tonya [name], female   DOB: [birth year], 67 y.o.   MRN: [id number]   Tonya, [name]    Date of visit:  05/09/2013 DOB:  1945-10-06    Age:  67 yrs. Medical record number:  [id number]     Account number:  [id number] Primary Care Provider: Ernst Breach ____________________________ CURRENT DIAGNOSES  1. Palpitations  2. Chest Pain  3. Smoking or Tobacco Abuse  4. Hypertension,Essential (Benign) ____________________________ ALLERGIES  Penicillins, Rash ____________________________ MEDICATIONS  1. bupropion HCl 150 mg tablet extended release, 1 p.o. daily ____________________________ HISTORY OF PRESENT ILLNESS  This very nice 67 year old white female is seen at the request of [name] for evaluation of palpitations and chest discomfort. The patient has a prior history of very mild hypertension as well as some mild anxiety. She began to have episodes of palpitations that would occur more when she was angry or upset. The discomfort was described as a thought or fluttering. In addition she would describe a pressure or heaviness at times when she would get angry not always associated with palpitations. These complaints were elicited and cardiac evaluation was recommended. She has no angina and denies PND, orthopnea, syncope, or claudication. Her blood pressure may have been slightly elevated previously. She does smoke fairly heavily and has tried to stop previously but has been unsuccessful. She has over a 50-pack-year history. She moved here about 3 years ago from [place]. ___________________________ PAST HISTORY  Past Medical Illnesses:  hypertension, anxiety;  Surgical Procedures:  cholecystectomy (lap), hysterectomy, breast biopsy;  LVEF not documented,   ____________________________ FAMILY HISTORY Brother -- Influenza/Pneumonia Brother -- Unknown Disease Father -- Heart Attack, Stroke, Ischemic heart disease, Deceased Mother -- Stroke,  Deceased ____________________________ SOCIAL HISTORY Alcohol Use:  socially;  Smoking:  smokes cigarettes, 1 ppd, 50 pack year history;  Diet:  regular diet;  Lifestyle:  separated;  Exercise:  no regular exercise;  Occupation:  worked for state of [place];  Residence:  grandaughter lives with her;   ____________________________ REVIEW OF SYSTEMS General:  denies recent weight change, fatique or change in exercise tolerance.  Integumentary:history of acne Eyes: denies diplopia, history of glaucoma or visual problems. Ears, Nose, Throat, Mouth:  denies any hearing loss, epistaxis, hoarseness or difficulty speaking. Respiratory: denies dyspnea, cough, wheezing or hemoptysis. Cardiovascular:  please review HPI Abdominal: dyspepsiaGenitourinary-Female: no dysuria, urgency, frequency, UTIs, or stress incontinence Musculoskeletal:  generalized arthritis Neurological:  occasional headaches Psychiatric:  denies depession or anxiety.  ____________________________ PHYSICAL EXAMINATION VITAL SIGNS  Blood Pressure:  114/60 Sitting, Right arm, regular cuff  , 110/60 Standing, Left arm, large cuff  , 118/70 Supine, Left arm and large cuff   Pulse:  70/min. Weight:  159.00 lbs. Height:  66"BMI: 25  Constitutional:  pleasant African American female in no acute distress Skin:  warm and dry to touch, no apparent skin lesions, or masses noted. Head:  normocephalic, normal hair pattern, no masses or tenderness Eyes:  EOMS Intact, PERRLA, C and S clear, Funduscopic exam not done. ENT:  ears, nose and throat reveal no gross abnormalities.  Dentition good. Neck:  supple, without massess. No JVD, thyromegaly or carotid bruits. Carotid upstroke normal. Chest:  normal symmetry, clear to auscultation and percussion. Cardiac:  regular rhythm, normal S1 and S2, No S3 or S4, no murmurs, gallops or rubs detected. Abdomen:  abdomen soft,non-tender, no masses, no hepatospenomegaly, or aneurysm noted Peripheral Pulses:   the femoral,dorsalis pedis, and posterior tibial pulses are full and equal bilaterally with no bruits  auscultated. Extremities & Back:  no deformities, clubbing, cyanosis, erythema or edema observed. Normal muscle strength and tone. Neurological:  no gross motor or sensory deficits noted, affect appropriate, oriented x3. ____________________________ IMPRESSIONS/PLAN  1. Episodic palpitations 2. Chest tightness in a patient with several risk factors 3. Tobacco abuse with need to discontinue 4. Prior history of possible mild hypertension but blood pressure normotensive today  Recommendations:  Review lab work at primary physician's office. She will wear a cardiac event monitor and I will plan to do a treadmill test because of the chest discomfort.EKG shows left axis deviation. ____________________________ TODAYS ORDERS  1. treadmill:  Regular TM At [facility]  2. [place]: 1 Day  3. 12 Lead EKG: Today                       ____________________________ Cardiology Physician:  Darden Palmer MD [facility]

## 2013-05-30 ENCOUNTER — Telehealth: Payer: Self-pay | Admitting: Internal Medicine

## 2013-05-30 DIAGNOSIS — F17200 Nicotine dependence, unspecified, uncomplicated: Secondary | ICD-10-CM

## 2013-05-30 MED ORDER — BUPROPION HCL ER (XL) 150 MG PO TB24: 150.0000 mg | ORAL_TABLET | Freq: Every day | ORAL | Status: DC

## 2013-05-30 NOTE — Telephone Encounter (Signed)
Refill request for bupropion for 90 days

## 2013-06-04 ENCOUNTER — Encounter: Payer: Self-pay | Admitting: Cardiology

## 2013-06-04 DIAGNOSIS — E785 Hyperlipidemia, unspecified: Secondary | ICD-10-CM | POA: Insufficient documentation

## 2013-06-04 DIAGNOSIS — I493 Ventricular premature depolarization: Secondary | ICD-10-CM | POA: Insufficient documentation

## 2013-06-04 NOTE — Progress Notes (Signed)
Patient ID: Tonya [name], female   DOB: [birth year], 67 y.o.   MRN: [id number]   Tonya [name], Tonya [name]    Date of visit:  06/04/2013 DOB:  1946-04-24    Age:  66 yrs. Medical record number:   [id number]  Primary Care Provider: Ernst Breach  CURRENT DIAGNOSES  1. Arrhythmia-PVC's(symptomatic)  2. Smoking or Tobacco Abuse  3. Hypertension,Essential (Benign)  TREADMILL   The patient exercised on the standard Bruce protocol a total of 6:45 minutes into Bruce stage 2 achieving  a workload of 7 METS. The test was stopped due to dyspnea and fatigue. The patient had no chest pain suggestive of angina. Heart rate rose to 142 which was 92% of predicted maximum. Blood pressure response was normal.  12-lead EKG is normal at rest. With exercise the patient achieved target heart rate and had no ST segment depression consistent with ischemia. No arrhythmias occurred.  IMPRESSIONS:  1. Clinically and EKG negative for ischemia 2. Reduced exercise capacity for age 51. Event monitor showing occasional PVC's   Recommendations:  The patient has a negative adequate exercise treadmill test with no evidence of myocardial ischemia.  She has worn a cardiac event monitor now for close to one month  And would like to turn it in.  She had one brief episode of nonsustained SVT and otherwise had PVCs during the recording. She is not really having much in the way of palpitations at this time.  At the present time I do not think that she needs to have any treatment for arrhythmias. She has no evidence of myocardial ischemia. I recommended that she reduce caffeine intake and gave her literature on PVCs. If she is symptomatic from palpitations she could use beta blockers although these likely would not eradicate the PVCs. I will see her on an as-needed basis. Thank you for asking me to see her with you. She would benefit from smoking cessation as well as lipid treatment.    MOST RECENT LIPID PANEL 05/01/13  CHOL  TOTL 255 mg/dl, LDL 97 calc, HDL 39 mg/dl, TRIGLYCER 93 mg/dl, CHOL/HDL 4.0 (Calc) and VLDL 19   Cardiology Physician:  Darden Palmer. MD [facility]

## 2013-08-31 ENCOUNTER — Other Ambulatory Visit: Payer: Self-pay | Admitting: Family Medicine

## 2013-09-02 NOTE — Telephone Encounter (Signed)
I refilled the medication, however, lets go ahead and see her back in regards to anxiety.

## 2013-09-02 NOTE — Telephone Encounter (Signed)
SHANE I THINK THIS IS YOUR P T

## 2013-09-02 DEATH — deceased

## 2013-09-04 ENCOUNTER — Ambulatory Visit (INDEPENDENT_AMBULATORY_CARE_PROVIDER_SITE_OTHER): Payer: Medicare HMO | Admitting: Medical

## 2013-09-04 ENCOUNTER — Encounter: Payer: Self-pay | Admitting: Medical

## 2013-09-04 VITALS — BP 128/80 | HR 80 | Temp 98.3°F | Resp 16 | Wt 162.0 lb

## 2013-09-04 DIAGNOSIS — R42 Dizziness and giddiness: Secondary | ICD-10-CM

## 2013-09-04 DIAGNOSIS — Z23 Encounter for immunization: Secondary | ICD-10-CM

## 2013-09-04 DIAGNOSIS — J309 Allergic rhinitis, unspecified: Secondary | ICD-10-CM

## 2013-09-04 MED ORDER — AZELASTINE-FLUTICASONE 137-50 MCG/ACT NA SUSP: 1.0000 | Freq: Two times a day (BID) | NASAL | Status: DC

## 2013-09-04 NOTE — Patient Instructions (Signed)
Begin Dymista nasal spray, 1 spray per nostril twice daily.  Increase water intake  Begin Benadryl OTC at bedtime, 25mg  nightly for the next week.    If not much improved in 1 week, then let me know.

## 2013-09-04 NOTE — Progress Notes (Signed)
Subjective:  Tonya [name] is a 67 y.o. female who presents for lightheadedness.  Her last week she notes lightheaded spells, a feeling that someone is blowing in her right ear, some ear fullness, she has a nighttime cough, drippy watery nose nighttime and morning time, has some teeth pain last week, mild sinus pain last week. She has had postnasal drainage. She denies fever, nausea, vomiting, headache, ear pain, dyspnea, leg swelling, chest pain, shortness of breath.  Treatment to date: None Denies sick contacts.  No other aggravating or relieving factors.  She has questions about the pneumococcal vaccine today which she has never had. She continues to smoke. She saw Dr. Donnie Aho cardiology in September, no cardiac problems down. No other c/o.  ROS as in subjective.   Objective: Filed Vitals:   09/04/13 1050  BP: 128/80  Pulse: 80  Temp: 98.3 F (36.8 C)  Resp: 16    General appearance: Alert, WD/WN, no distress                             Skin: warm, no rash                           Head: no sinus tenderness                            Eyes: conjunctiva normal, corneas clear, PERRLA                            Ears: pearly TMs, external ear canals normal                          Nose: septum midline, turbinates swollen, with erythema and clear discharge             Mouth/throat: MMM, tongue normal, mild post nasal drainage                           Neck: supple, no adenopathy, no thyromegaly, nontender                          Heart: RRR, normal S1, S2, no murmurs                         Lungs: CTA bilaterally, no wheezes, rales, or rhonchi     Assessment: Encounter Diagnoses  Name Primary?  . Allergic rhinitis Yes  . Lightheadedness   . Need for pneumococcal vaccination     Plan: Allergic rhinitis, lightheadedness - use Benadryl at bedtime for the next week, begin Dymista nasal spray twice daily, and if not improving in 1 wk, let me know.   Call back regarding results on  Dymista.  Counseled on the pneumococcal vaccine.  Vaccine information sheet given.  Pneumococcal vaccine given after consent obtained. Call back in 1-2 wk.

## 2013-11-23 ENCOUNTER — Other Ambulatory Visit: Payer: Self-pay | Admitting: Medical

## 2013-11-25 ENCOUNTER — Other Ambulatory Visit: Payer: Self-pay

## 2013-11-25 DIAGNOSIS — Z1231 Encounter for screening mammogram for malignant neoplasm of breast: Secondary | ICD-10-CM

## 2013-11-26 ENCOUNTER — Ambulatory Visit: Payer: Medicare HMO | Admitting: Medical

## 2013-12-02 ENCOUNTER — Encounter: Payer: Self-pay | Admitting: Medical

## 2013-12-02 ENCOUNTER — Ambulatory Visit (INDEPENDENT_AMBULATORY_CARE_PROVIDER_SITE_OTHER): Payer: Medicare HMO | Admitting: Medical

## 2013-12-02 VITALS — BP 138/88 | HR 72 | Temp 97.9°F | Wt 162.0 lb

## 2013-12-02 DIAGNOSIS — L299 Pruritus, unspecified: Secondary | ICD-10-CM

## 2013-12-02 DIAGNOSIS — R141 Gas pain: Secondary | ICD-10-CM

## 2013-12-02 DIAGNOSIS — R142 Eructation: Secondary | ICD-10-CM

## 2013-12-02 DIAGNOSIS — R209 Unspecified disturbances of skin sensation: Secondary | ICD-10-CM

## 2013-12-02 DIAGNOSIS — R143 Flatulence: Secondary | ICD-10-CM

## 2013-12-02 DIAGNOSIS — R140 Abdominal distension (gaseous): Secondary | ICD-10-CM

## 2013-12-02 DIAGNOSIS — R1011 Right upper quadrant pain: Secondary | ICD-10-CM

## 2013-12-02 DIAGNOSIS — R200 Anesthesia of skin: Secondary | ICD-10-CM

## 2013-12-02 DIAGNOSIS — F17200 Nicotine dependence, unspecified, uncomplicated: Secondary | ICD-10-CM

## 2013-12-02 NOTE — Progress Notes (Signed)
    Subjective:   Tonya [name] is a 68 y.o. female presenting on 12/02/2013 with on and off her stomach swells when eating something sweet  She reports about a week hx/o pain and abdominal swelling.  Right side of abdomen seems to swell and have pain if drinking something sweet.  Normally this happens at night.  Last time this happened, pain lasted 3-4 nights, recurred at night mostly.  On an antibiotic currently for peridontal disease.  Denies diarrhea, nausea, constipation.   No urinary c/o.  No other aggravating or relieving factors.    She reports itchy dry skin of upper back x a few weeks, intermittent.  Wants referral to dermatology.  Using aveeno lotion.  She notes right toe numbness x months, no other foot or toe numbness or tingling.  No leg swelling. No back pain.   No specific injury or trauma.   Review of Systems ROS as in subjective      Objective:     Filed Vitals:   12/02/13 1021  BP: 138/88  Pulse: 72  Temp: 97.9 F (36.6 C)    General appearance: alert, no distress, WD/WN Skin: no obvious rash of back, no excoriations, skin unremarkable Neck: supple, no lymphadenopathy, no thyromegaly, no masses Heart: RRR, normal S1, S2, no murmurs Lungs: CTA bilaterally, no wheezes, rhonchi, or rales Abdomen: +bs, soft, non tender, non distended, no masses, no hepatomegaly, no splenomegaly Back: nontender Pulses: 1+ pedal pulses Ext: no edema      Assessment: Encounter Diagnoses  Name Primary?  . Abdominal bloating Yes  . Abdominal pain, right upper quadrant   . Pruritic condition   . Numbness of toes   . Tobacco use disorder      Plan: Abdominal bloating and pain - based on symptoms and negative exam, likely due to sugary beverages/diet triggers.  No other symptoms or worrisome associated symptoms.  Advised she avoid gas causing foods and high sugar foods.  Advised she drink water primarily.  If not improving or worse, then recheck sooner.  otherwise recheck  in 1-2 wk.   Pruritis condition - likely winter itch. No abnormal exam findings.  Advised warm, not hot showers, can use lotion daily, hydrocortisone OTC topically prn.  recheck if not improving.  Numbness of toes - unable to fully evaluate this today, but advised she return to discuss further  Tobacco use - stop tobacco   Tonya [name] was seen today for on and off her stomach swells when eating something sweet.  Diagnoses and associated orders for this visit:  Abdominal bloating  Abdominal pain, right upper quadrant  Pruritic condition  Numbness of toes  Tobacco use disorder    Return 2wk.

## 2013-12-04 ENCOUNTER — Other Ambulatory Visit: Payer: Self-pay | Admitting: Medical

## 2013-12-05 NOTE — Telephone Encounter (Signed)
Is this okay to refill? 

## 2014-01-01 ENCOUNTER — Ambulatory Visit
Admission: RE | Admit: 2014-01-01 | Discharge: 2014-01-01 | Disposition: A | Discharge status: Home / Self Care | Payer: Medicare HMO | Source: Ambulatory Visit

## 2014-01-01 DIAGNOSIS — Z1231 Encounter for screening mammogram for malignant neoplasm of breast: Secondary | ICD-10-CM

## 2014-01-01 LAB — HM MAMMOGRAPHY

## 2014-01-02 NOTE — Progress Notes (Signed)
Patient notified normal mammogram/[facility]

## 2014-02-23 ENCOUNTER — Other Ambulatory Visit: Payer: Self-pay | Admitting: Medical

## 2014-03-10 ENCOUNTER — Other Ambulatory Visit: Payer: Self-pay | Admitting: Medical

## 2014-03-18 ENCOUNTER — Telehealth: Payer: Self-pay | Admitting: Internal Medicine

## 2014-03-18 NOTE — Telephone Encounter (Signed)
Faxed over medical records to [facility] @ [phone]

## 2014-03-22 ENCOUNTER — Other Ambulatory Visit: Payer: Self-pay | Admitting: Medical

## 2014-04-14 ENCOUNTER — Ambulatory Visit (INDEPENDENT_AMBULATORY_CARE_PROVIDER_SITE_OTHER): Payer: Medicare HMO

## 2014-04-14 ENCOUNTER — Ambulatory Visit (INDEPENDENT_AMBULATORY_CARE_PROVIDER_SITE_OTHER): Payer: Medicare HMO | Admitting: Podiatry

## 2014-04-14 ENCOUNTER — Encounter: Payer: Self-pay | Admitting: Podiatry

## 2014-04-14 VITALS — BP 105/83 | HR 71 | Resp 16

## 2014-04-14 DIAGNOSIS — M79609 Pain in unspecified limb: Secondary | ICD-10-CM

## 2014-04-14 DIAGNOSIS — M722 Plantar fascial fibromatosis: Secondary | ICD-10-CM

## 2014-04-14 DIAGNOSIS — M79673 Pain in unspecified foot: Secondary | ICD-10-CM

## 2014-04-14 NOTE — Progress Notes (Signed)
   Subjective:    Patient ID: Tonya [name], female    DOB: May 17, 1946, 68 y.o.   MRN: [id number]  HPI N right toe numb, right heel and arch [name] since August 2014 O  C A prolonged walking and standing exacerbates [name] tried heel cup and inserts, they did not help  This patient presents complaining of [name] for the past 3 months diffusely in the right foot on weightbearing and aggravated with direct shoe pressure especially on the dorsal right midfoot area. She describes some right heel and arch [name] with weightbearing. She is most comfortable in a sandal like shoe. Patient is interested in accommodate foot support to reduce some [name] and discomfort with weightbearing. She also describes some numbness in the right hallux.  Review of Systems  Constitutional: Positive for activity change and appetite change.  Genitourinary: Positive for frequency.  Musculoskeletal: Positive for arthralgias, back [name] and myalgias.  Skin: Positive for rash.  Allergic/Immunologic: Positive for environmental allergies.  Neurological: Positive for light-headedness and numbness.  All other systems reviewed and are negative.      Objective:   Physical Exam  Orientated x3 black female  Vascular: DP and PT pulses 2/4 bilaterally  Neurological: Sensation to 10 g monofilament wire intact 5/5 bilaterally Vibratory sensation intact bilaterally Ankle reflexes equal and reactive bilaterally  Dermatological: Texture and turgor within normal limits  Musculoskeletal: Upon weightbearing the right and left feet appear to have a depressed medial longitudinal arch bilaterally Valgus heel position upon weightbearing bilaterally Increase of the calcaneocuboid angle right more noticeable than the left upon weightbearing  X-ray report weightbearing left foot  Intact bony structures done a fracture and/or dislocation noted  Posterior and inferior calcaneal spurs  Mildly wedge-shaped navicular  Mild  increase of the calcaneal cuboid ankle  Radiographic impression:  No acute bony abnormality noted  Mild transverse plane flatfoot deformity  X-ray report weightbearing right foot  Intact bony structure without fracture and/or dislocation  Posterior and inferior calcaneal spurs  Wedge shaped navicular  Increased calcaneocuboid angle  Radiographic impression:  No acute bony abnormality noted in the right foot  Transverse plane flatfoot deformity         Assessment & Plan:   Assessment: Satisfactory neurovascular status bilaterally Transverse plane flatfoot deformity bilaterally right more symptomatic than left Plantar fasciitis bilaterally  Plan: Will obtain a digital scan for fabrication of custom foot orthotic for the indication of bilateral fasciitis  Notify patient on receipt of custom orthotic

## 2014-04-15 ENCOUNTER — Encounter: Payer: Self-pay | Admitting: Podiatry

## 2014-05-05 ENCOUNTER — Ambulatory Visit (INDEPENDENT_AMBULATORY_CARE_PROVIDER_SITE_OTHER): Payer: Medicare HMO | Admitting: Podiatry

## 2014-05-05 ENCOUNTER — Encounter: Payer: Self-pay | Admitting: Podiatry

## 2014-05-05 VITALS — BP 149/96 | HR 86 | Resp 12

## 2014-05-05 DIAGNOSIS — M722 Plantar fascial fibromatosis: Secondary | ICD-10-CM

## 2014-05-05 NOTE — Progress Notes (Signed)
   Subjective:    Patient ID: Tonya [name], female    DOB: 02-21-1946, 68 y.o.   MRN: [id number]  HPI puo and given instruction.  She also mentions some [name] in her medial right ankle  Review of Systems     Objective:   Physical Exam  No edema right ankle Custom orthotics contour satisfactorily       Assessment & Plan:   Assessment: Satisfactory fit of custom orthotics to reduce symptoms associated with pes planus and plantar fasciitis  Plan: Orthotics dispensed with wearing instruction provided  Reappoint x6 weeks

## 2014-05-05 NOTE — Patient Instructions (Signed)

## 2014-05-31 ENCOUNTER — Other Ambulatory Visit: Payer: Self-pay | Admitting: Medical

## 2014-06-11 ENCOUNTER — Ambulatory Visit (INDEPENDENT_AMBULATORY_CARE_PROVIDER_SITE_OTHER): Payer: Medicare HMO | Admitting: Podiatry

## 2014-06-11 ENCOUNTER — Encounter: Payer: Self-pay | Admitting: Podiatry

## 2014-06-11 VITALS — BP 160/94 | HR 76 | Resp 12

## 2014-06-11 DIAGNOSIS — M722 Plantar fascial fibromatosis: Secondary | ICD-10-CM

## 2014-06-11 NOTE — Patient Instructions (Signed)
We will contact you when that replacement right orthotic arrives

## 2014-06-12 NOTE — Progress Notes (Signed)
Patient ID: Tonya [name], female   DOB: 01/30/1946, 68 y.o.   MRN: [id number] Subjective: This patient presents for follow care after dispensing custom foot orthotics on the visit of 05/05/2014. Patient states that she lost the right orthotic and is requesting a replacement right foot orthotic she is still complaining of right foot pain  Objective: Left foot orthotic contour satisfactorily  Assessment: Patient lost the right foot orthotic   Plan: Request lab to remake a replacement right foot orthotic Notify patient on receipt of replacement foot orthotic

## 2014-07-23 ENCOUNTER — Telehealth: Payer: Self-pay | Admitting: *Deleted

## 2014-07-23 NOTE — Telephone Encounter (Signed)
I was waiting for an insert to come in.  I was told it would take about 3 weeks.  It's been over 3 weeks now and I'm checking to see if they are in yet.  Thank you.

## 2014-08-12 ENCOUNTER — Ambulatory Visit (INDEPENDENT_AMBULATORY_CARE_PROVIDER_SITE_OTHER): Payer: Medicare HMO | Admitting: Family Medicine

## 2014-08-12 ENCOUNTER — Encounter: Payer: Self-pay | Admitting: Family Medicine

## 2014-08-12 ENCOUNTER — Ambulatory Visit (INDEPENDENT_AMBULATORY_CARE_PROVIDER_SITE_OTHER): Payer: Medicare HMO | Admitting: General Practice

## 2014-08-12 VITALS — BP 140/80 | HR 72 | Temp 97.8°F | Resp 16 | Ht 65.25 in | Wt 164.5 lb

## 2014-08-12 DIAGNOSIS — E785 Hyperlipidemia, unspecified: Secondary | ICD-10-CM

## 2014-08-12 DIAGNOSIS — Z720 Tobacco use: Secondary | ICD-10-CM

## 2014-08-12 DIAGNOSIS — I10 Essential (primary) hypertension: Secondary | ICD-10-CM

## 2014-08-12 DIAGNOSIS — Z23 Encounter for immunization: Secondary | ICD-10-CM

## 2014-08-12 DIAGNOSIS — F17200 Nicotine dependence, unspecified, uncomplicated: Secondary | ICD-10-CM

## 2014-08-12 LAB — BASIC METABOLIC PANEL
BUN: 21 mg/dL (ref 6–23)
CHLORIDE: 104 meq/L (ref 96–112)
CO2: 29 mEq/L (ref 19–32)
Calcium: 10 mg/dL (ref 8.4–10.5)
Creatinine, Ser: 0.8 mg/dL (ref 0.4–1.2)
GFR: 90.72 mL/min (ref >60.00)
Glucose, Bld: 89 mg/dL (ref 70–99)
POTASSIUM: 4.1 meq/L (ref 3.5–5.1)
Sodium: 140 mEq/L (ref 135–145)

## 2014-08-12 LAB — LIPID PANEL
CHOLESTEROL: 202 mg/dL — AB (ref 0–200)
HDL: 37.5 mg/dL — ABNORMAL LOW (ref >39.00)
LDL Cholesterol: 141 mg/dL — ABNORMAL HIGH (ref 0–99)
NONHDL: 164.5
Total CHOL/HDL Ratio: 5
Triglycerides: 119 mg/dL (ref 0.0–149.0)
VLDL: 23.8 mg/dL (ref 0.0–40.0)

## 2014-08-12 LAB — CBC WITH DIFFERENTIAL/PLATELET
Basophils Absolute: 0 10*3/uL (ref 0.0–0.1)
Basophils Relative: 0.4 % (ref 0.0–3.0)
EOS ABS: 0.1 10*3/uL (ref 0.0–0.7)
Eosinophils Relative: 1.8 % (ref 0.0–5.0)
HEMATOCRIT: 43.3 % (ref 36.0–46.0)
HEMOGLOBIN: 14.4 g/dL (ref 12.0–15.0)
Lymphocytes Relative: 45.7 % (ref 12.0–46.0)
Lymphs Abs: 3 10*3/uL (ref 0.7–4.0)
MCHC: 33.4 g/dL (ref 30.0–36.0)
MCV: 92.3 fl (ref 78.0–100.0)
MONO ABS: 0.4 10*3/uL (ref 0.1–1.0)
Monocytes Relative: 6.5 % (ref 3.0–12.0)
NEUTROS ABS: 3 10*3/uL (ref 1.4–7.7)
Neutrophils Relative %: 45.6 % (ref 43.0–77.0)
PLATELETS: 270 10*3/uL (ref 150.0–400.0)
RBC: 4.69 Mil/uL (ref 3.87–5.11)
RDW: 14.2 % (ref 11.5–15.5)
WBC: 6.6 10*3/uL (ref 4.0–10.5)

## 2014-08-12 LAB — HEPATIC FUNCTION PANEL
ALK PHOS: 55 U/L (ref 39–117)
ALT: 13 U/L (ref 0–35)
AST: 18 U/L (ref 0–37)
Albumin: 3.7 g/dL (ref 3.5–5.2)
BILIRUBIN DIRECT: 0 mg/dL (ref 0.0–0.3)
TOTAL PROTEIN: 7.6 g/dL (ref 6.0–8.3)
Total Bilirubin: 0.4 mg/dL (ref 0.2–1.2)

## 2014-08-12 LAB — TSH: TSH: 0.85 u[IU]/mL (ref 0.35–4.50)

## 2014-08-12 MED ORDER — BUPROPION HCL ER (XL) 150 MG PO TB24: ORAL_TABLET | ORAL | Status: DC

## 2014-08-12 NOTE — Progress Notes (Signed)
Pre visit review using our clinic review tool, if applicable. No additional management support is needed unless otherwise documented below in the visit note. 

## 2014-08-12 NOTE — Patient Instructions (Signed)
Schedule your complete physical in 6 months We'll notify you of your lab results and make any changes if needed Restart the Wellbutrin- try and cut back on the # of cigarettes daily Keep up the good work on the exercise!  I'm proud of you! Call with any questions or concerns Welcome!  We're glad to have you!!! Happy Early Rudene Anda!!!

## 2014-08-12 NOTE — Progress Notes (Signed)
   Subjective:    Patient ID: Tonya [name], female    DOB: 1946-02-03, 68 y.o.   MRN: [id number]  HPI New to establish.  Previous MD- Estate agent, PA at [facility]  HTN- chronic problem, on Valsartan HCTZ daily.  No CP, SOB, HAs, visual changes, edema.  Hyperlipidemia- chronic problem, according to problem list.  Has never been on cholesterol meds.  Weight has been stable x1 year.  Recently resumed working out.  Tobacco use- previously on Wellbutrin to assist in quitting.  Pt is interested in quitting.  Smoking 1 ppd.  Previously on Wellbutrin had decreased smoking.  Arthritis- pt reports she was on Naproxen previously w/o relief.  Previous MD told her to add tylenol arthritis.  dx'd in 2010 w/ R hip arthritis.  [name] has improved w/ addition of Vit D3.   Review of Systems For ROS see HPI     Objective:   Physical Exam  Constitutional: She is oriented to person, place, and time. She appears well-developed and well-nourished. No distress.  HENT:  Head: Normocephalic and atraumatic.  Eyes: Conjunctivae and EOM are normal. Pupils are equal, round, and reactive to light.  Neck: Normal range of motion. Neck supple. No thyromegaly present.  Cardiovascular: Normal rate, regular rhythm, normal heart sounds and intact distal pulses.   No murmur heard. Pulmonary/Chest: Effort normal and breath sounds normal. No respiratory distress.  Abdominal: Soft. She exhibits no distension. There is no tenderness.  Musculoskeletal: She exhibits no edema.  Lymphadenopathy:    She has no cervical adenopathy.  Neurological: She is alert and oriented to person, place, and time.  Skin: Skin is warm and dry.  Psychiatric: She has a normal mood and affect. Her behavior is normal.  Vitals reviewed.         Assessment & Plan:

## 2014-08-13 ENCOUNTER — Other Ambulatory Visit: Payer: Self-pay | Admitting: General Practice

## 2014-08-13 DIAGNOSIS — E785 Hyperlipidemia, unspecified: Secondary | ICD-10-CM

## 2014-08-13 MED ORDER — SIMVASTATIN 20 MG PO TABS: 20.0000 mg | ORAL_TABLET | Freq: Every day | ORAL | Status: DC

## 2014-08-14 ENCOUNTER — Encounter: Payer: Self-pay | Admitting: Family Medicine

## 2014-08-14 NOTE — Assessment & Plan Note (Signed)
New to provider, ongoing for pt.  She is interested in restarting Wellbutrin in attempt to quit.  Refill provided.  Applauded her efforts.  Will follow.

## 2014-08-14 NOTE — Assessment & Plan Note (Signed)
New to provider, ongoing for pt.  BP is adequate but not ideal.  Not high enough to make med changes.  Check labs.  No anticipated med changes at this time.  Will follow.

## 2014-08-14 NOTE — Assessment & Plan Note (Signed)
New to provider, chronic for pt but no hx of meds.  Pt is exercising regularly.  Check labs.  Start meds prn.

## 2014-08-15 ENCOUNTER — Encounter: Payer: Self-pay | Admitting: Podiatry

## 2014-08-15 ENCOUNTER — Telehealth: Payer: Self-pay | Admitting: Family Medicine

## 2014-08-15 NOTE — Telephone Encounter (Signed)
Ok to attempt x3 months and then recheck

## 2014-08-15 NOTE — Telephone Encounter (Signed)
Caller name: Korrie Relation to pt: self Call back number:  [phone] Pharmacy:  Reason for call:   Patient wants to know if she can try to get her cholesterol down "on her own" and if it's not down in January then she could start taking the "statin"(does not know name of med) that was prescribed

## 2014-08-15 NOTE — Telephone Encounter (Signed)
Pt.notified

## 2014-08-22 ENCOUNTER — Telehealth: Payer: Self-pay | Admitting: Family Medicine

## 2014-08-22 NOTE — Telephone Encounter (Signed)
emmi mailed  °

## 2014-08-29 ENCOUNTER — Other Ambulatory Visit: Payer: Self-pay | Admitting: Family Medicine

## 2014-08-29 ENCOUNTER — Other Ambulatory Visit: Payer: Self-pay | Admitting: Medical

## 2014-08-29 NOTE — Telephone Encounter (Signed)
Med filled.  

## 2014-10-08 ENCOUNTER — Encounter: Payer: Self-pay | Admitting: General Practice

## 2014-10-08 ENCOUNTER — Other Ambulatory Visit (INDEPENDENT_AMBULATORY_CARE_PROVIDER_SITE_OTHER): Payer: Medicare HMO

## 2014-10-08 DIAGNOSIS — E785 Hyperlipidemia, unspecified: Secondary | ICD-10-CM

## 2014-10-08 LAB — HEPATIC FUNCTION PANEL
ALT: 11 U/L (ref 0–35)
AST: 17 U/L (ref 0–37)
Albumin: 3.9 g/dL (ref 3.5–5.2)
Alkaline Phosphatase: 50 U/L (ref 39–117)
Bilirubin, Direct: 0 mg/dL (ref 0.0–0.3)
Total Bilirubin: 0.4 mg/dL (ref 0.2–1.2)
Total Protein: 6.8 g/dL (ref 6.0–8.3)

## 2014-10-10 LAB — HM PAP SMEAR: HM Pap smear: NORMAL

## 2014-10-17 ENCOUNTER — Encounter: Payer: Self-pay | Admitting: General Practice

## 2014-10-21 ENCOUNTER — Telehealth: Payer: Self-pay | Admitting: Family Medicine

## 2014-10-21 NOTE — Telephone Encounter (Signed)
No other advice needed.  Pt needs to continue to work on healthy diet and regular exercise and repeat the lipid panel (no need for LFTs at that time, we'll use the ones just done).

## 2014-10-21 NOTE — Telephone Encounter (Signed)
Pt states that she never started the cholesterol medication. Pt had called back to the office on 08-15-14 and was advised by the provider that she could take 3 months and work on diet and exercise. Pt never cancelled the previous lab that was scheduled for the hepatic level.

## 2014-10-21 NOTE — Telephone Encounter (Signed)
Pt needing assistance with going over Lab results that she recvd via mail.

## 2015-01-28 ENCOUNTER — Telehealth: Payer: Self-pay | Admitting: Family Medicine

## 2015-01-28 NOTE — Telephone Encounter (Signed)
Pre visit letter for annual exam mailed

## 2015-02-16 ENCOUNTER — Other Ambulatory Visit: Payer: Self-pay

## 2015-02-16 DIAGNOSIS — Z1231 Encounter for screening mammogram for malignant neoplasm of breast: Secondary | ICD-10-CM

## 2015-02-18 ENCOUNTER — Encounter: Payer: Medicare HMO | Admitting: Family Medicine

## 2015-02-25 ENCOUNTER — Other Ambulatory Visit: Payer: Self-pay | Admitting: Family Medicine

## 2015-02-25 NOTE — Telephone Encounter (Signed)
Med filled.  

## 2015-03-17 ENCOUNTER — Ambulatory Visit
Admission: RE | Admit: 2015-03-17 | Discharge: 2015-03-17 | Disposition: A | Discharge status: Home / Self Care | Payer: Medicare HMO | Source: Ambulatory Visit

## 2015-03-17 DIAGNOSIS — Z1231 Encounter for screening mammogram for malignant neoplasm of breast: Secondary | ICD-10-CM

## 2015-05-25 ENCOUNTER — Other Ambulatory Visit: Payer: Self-pay | Admitting: Family Medicine

## 2015-05-25 ENCOUNTER — Telehealth: Payer: Self-pay

## 2015-05-25 NOTE — Telephone Encounter (Signed)
Medication filled to pharmacy as requested.   

## 2015-05-25 NOTE — Telephone Encounter (Signed)
Appointment scheduled.

## 2015-05-29 ENCOUNTER — Ambulatory Visit (INDEPENDENT_AMBULATORY_CARE_PROVIDER_SITE_OTHER): Payer: Medicare HMO | Admitting: Family Medicine

## 2015-05-29 ENCOUNTER — Encounter: Payer: Self-pay | Admitting: Family Medicine

## 2015-05-29 VITALS — BP 142/84 | HR 75 | Temp 97.9°F | Resp 16 | Wt 167.1 lb

## 2015-05-29 DIAGNOSIS — H6993 Unspecified Eustachian tube disorder, bilateral: Secondary | ICD-10-CM

## 2015-05-29 DIAGNOSIS — H6983 Other specified disorders of Eustachian tube, bilateral: Secondary | ICD-10-CM

## 2015-05-29 DIAGNOSIS — H6980 Other specified disorders of Eustachian tube, unspecified ear: Secondary | ICD-10-CM | POA: Insufficient documentation

## 2015-05-29 MED ORDER — FLUTICASONE PROPIONATE 50 MCG/ACT NA SUSP: 2.0000 | Freq: Every day | NASAL | Status: DC

## 2015-05-29 MED ORDER — CETIRIZINE HCL 10 MG PO TABS: 10.0000 mg | ORAL_TABLET | Freq: Every day | ORAL | Status: AC

## 2015-05-29 NOTE — Progress Notes (Signed)
   Subjective:    Patient ID: Tonya [name], female    DOB: 07-28-1946, 69 y.o.   MRN: [id number]  HPI URI- bilateral ear fullness, + facial [name]/pressure.  No tooth [name].  sxs started 5 days ago.  No fevers.  No N/V.  + PND and nocturnal cough.  No known sick contacts.   Review of Systems For ROS see HPI     Objective:   Physical Exam  Constitutional: She appears well-developed and well-nourished. No distress.  HENT:  Head: Normocephalic and atraumatic.  Right Ear: Tympanic membrane is retracted.  Left Ear: Tympanic membrane is retracted.  Nose: Mucosal edema and rhinorrhea present. Right sinus exhibits no maxillary sinus tenderness and no frontal sinus tenderness. Left sinus exhibits no maxillary sinus tenderness and no frontal sinus tenderness.  Mouth/Throat: Mucous membranes are normal. Posterior oropharyngeal erythema (w/ PND) present.  Eyes: Conjunctivae and EOM are normal. Pupils are equal, round, and reactive to light.  Neck: Normal range of motion. Neck supple.  Cardiovascular: Normal rate, regular rhythm and normal heart sounds.   Pulmonary/Chest: Effort normal and breath sounds normal. No respiratory distress. She has no wheezes. She has no rales.  Lymphadenopathy:    She has no cervical adenopathy.  Vitals reviewed.         Assessment & Plan:

## 2015-05-29 NOTE — Progress Notes (Signed)
Pre visit review using our clinic review tool, if applicable. No additional management support is needed unless otherwise documented below in the visit note. 

## 2015-05-29 NOTE — Assessment & Plan Note (Signed)
New.  Pt's sxs and PE consistent w/ eustachian tube dysfunction due to nasal congestion/allergies.  No evidence of infxn- no need for abx.  Start daily antihistamine and nasal steroid.  Reviewed supportive care and red flags that should prompt return.  Pt expressed understanding and is in agreement w/ plan.

## 2015-05-29 NOTE — Patient Instructions (Signed)
Follow up as scheduled for your physical Your symptoms are all due to your nasal congestion/allergies Start daily Zyrtec Use the nasal spray- 2 sprays each nostril daily Drink plenty of fluids Call with any questions or concerns Happy Labor Day!!!

## 2015-06-17 ENCOUNTER — Ambulatory Visit (INDEPENDENT_AMBULATORY_CARE_PROVIDER_SITE_OTHER): Payer: Medicare HMO

## 2015-06-17 VITALS — BP 132/78 | HR 75 | Temp 98.2°F | Ht 65.0 in | Wt 163.8 lb

## 2015-06-17 DIAGNOSIS — Z1159 Encounter for screening for other viral diseases: Secondary | ICD-10-CM | POA: Diagnosis not present

## 2015-06-17 DIAGNOSIS — Z0000 Encounter for general adult medical examination without abnormal findings: Secondary | ICD-10-CM | POA: Diagnosis not present

## 2015-06-17 DIAGNOSIS — Z23 Encounter for immunization: Secondary | ICD-10-CM | POA: Diagnosis not present

## 2015-06-17 NOTE — Progress Notes (Signed)
Pre visit review using our clinic review tool, if applicable. No additional management support is needed unless otherwise documented below in the visit note. 

## 2015-06-17 NOTE — Patient Instructions (Addendum)
Follow up with Dr. Birdie Riddle for physical and labs.    Flu shot to be given during physical appointment.    Continue to exercise and consume a healthy diet (reducing simple carb and fat intake).    Fat and Cholesterol Control Diet Fat and cholesterol levels in your blood and organs are influenced by your diet. High levels of fat and cholesterol may lead to diseases of the heart, small and large blood vessels, gallbladder, liver, and pancreas. CONTROLLING FAT AND CHOLESTEROL WITH DIET Although exercise and lifestyle factors are important, your diet is key. That is because certain foods are known to raise cholesterol and others to lower it. The goal is to balance foods for their effect on cholesterol and more importantly, to replace saturated and trans fat with other types of fat, such as monounsaturated fat, polyunsaturated fat, and omega-3 fatty acids. On average, a person should consume no more than 15 to 17 g of saturated fat daily. Saturated and trans fats are considered "bad" fats, and they will raise LDL cholesterol. Saturated fats are primarily found in animal products such as meats, butter, and cream. However, that does not mean you need to give up all your favorite foods. Today, there are good tasting, low-fat, low-cholesterol substitutes for most of the things you like to eat. Choose low-fat or nonfat alternatives. Choose round or loin cuts of red meat. These types of cuts are lowest in fat and cholesterol. Chicken (without the skin), fish, veal, and ground [place] breast are great choices. Eliminate fatty meats, such as hot dogs and salami. Even shellfish have little or no saturated fat. Have a 3 oz (85 g) portion when you eat lean meat, poultry, or fish. Trans fats are also called "partially hydrogenated oils." They are oils that have been scientifically manipulated so that they are solid at room temperature resulting in a longer shelf life and improved taste and texture of foods in which they are  added. Trans fats are found in stick margarine, some tub margarines, cookies, crackers, and baked goods.  When baking and cooking, oils are a great substitute for butter. The monounsaturated oils are especially beneficial since it is believed they lower LDL and raise HDL. The oils you should avoid entirely are saturated tropical oils, such as coconut and palm.  Remember to eat a lot from food groups that are naturally free of saturated and trans fat, including fish, fruit, vegetables, beans, grains (barley, rice, couscous, bulgur wheat), and pasta (without cream sauces).  IDENTIFYING FOODS THAT LOWER FAT AND CHOLESTEROL  Soluble fiber may lower your cholesterol. This type of fiber is found in fruits such as apples, vegetables such as broccoli, potatoes, and carrots, legumes such as beans, peas, and lentils, and grains such as barley. Foods fortified with plant sterols (phytosterol) may also lower cholesterol. You should eat at least 2 g per day of these foods for a cholesterol lowering effect.  Read package labels to identify low-saturated fats, trans fat free, and low-fat foods at the supermarket. Select cheeses that have only 2 to 3 g saturated fat per ounce. Use a heart-healthy tub margarine that is free of trans fats or partially hydrogenated oil. When buying baked goods (cookies, crackers), avoid partially hydrogenated oils. Breads and muffins should be made from whole grains (whole-wheat or whole oat flour, instead of "flour" or "enriched flour"). Buy non-creamy canned soups with reduced salt and no added fats.  FOOD PREPARATION TECHNIQUES  Never deep-fry. If you must fry, either stir-fry, which uses  very little fat, or use non-stick cooking sprays. When possible, broil, bake, or roast meats, and steam vegetables. Instead of putting butter or margarine on vegetables, use lemon and herbs, applesauce, and cinnamon (for squash and sweet potatoes). Use nonfat yogurt, salsa, and low-fat dressings for salads.   LOW-SATURATED FAT / LOW-FAT FOOD SUBSTITUTES Meats / Saturated Fat (g)  Avoid: Steak, marbled (3 oz/85 g) / 11 g  Choose: Steak, lean (3 oz/85 g) / 4 g  Avoid: Hamburger (3 oz/85 g) / 7 g  Choose: Hamburger, lean (3 oz/85 g) / 5 g  Avoid: Ham (3 oz/85 g) / 6 g  Choose: Ham, lean cut (3 oz/85 g) / 2.4 g  Avoid: Chicken, with skin, dark meat (3 oz/85 g) / 4 g  Choose: Chicken, skin removed, dark meat (3 oz/85 g) / 2 g  Avoid: Chicken, with skin, light meat (3 oz/85 g) / 2.5 g  Choose: Chicken, skin removed, light meat (3 oz/85 g) / 1 g Dairy / Saturated Fat (g)  Avoid: Whole milk (1 cup) / 5 g  Choose: Low-fat milk, 2% (1 cup) / 3 g  Choose: Low-fat milk, 1% (1 cup) / 1.5 g  Choose: Skim milk (1 cup) / 0.3 g  Avoid: Hard cheese (1 oz/28 g) / 6 g  Choose: Skim milk cheese (1 oz/28 g) / 2 to 3 g  Avoid: Cottage cheese, 4% fat (1 cup) / 6.5 g  Choose: Low-fat cottage cheese, 1% fat (1 cup) / 1.5 g  Avoid: Ice cream (1 cup) / 9 g  Choose: Sherbet (1 cup) / 2.5 g  Choose: Nonfat frozen yogurt (1 cup) / 0.3 g  Choose: Frozen fruit bar / trace  Avoid: Whipped cream (1 tbs) / 3.5 g  Choose: Nondairy whipped topping (1 tbs) / 1 g Condiments / Saturated Fat (g)  Avoid: Mayonnaise (1 tbs) / 2 g  Choose: Low-fat mayonnaise (1 tbs) / 1 g  Avoid: Butter (1 tbs) / 7 g  Choose: Extra light margarine (1 tbs) / 1 g  Avoid: Coconut oil (1 tbs) / 11.8 g  Choose: Olive oil (1 tbs) / 1.8 g  Choose: Corn oil (1 tbs) / 1.7 g  Choose: Safflower oil (1 tbs) / 1.2 g  Choose: Sunflower oil (1 tbs) / 1.4 g  Choose: Soybean oil (1 tbs) / 2.4 g  Choose: Canola oil (1 tbs) / 1 g Document Released: 09/19/2005 Document Revised: 01/14/2013 Document Reviewed: 12/18/2013 [employer] Patient Information 2015 [place], [place]. This information is not intended to replace advice given to you by your health care provider. Make sure you discuss any questions you have with your health care  provider.  Fat and Cholesterol Control Diet Your diet has an affect on your fat and cholesterol levels in your blood and organs. Too much fat and cholesterol in your blood can affect your:  Heart.  Blood vessels (arteries, veins).  Gallbladder.  Liver.  Pancreas. CONTROL FAT AND CHOLESTEROL WITH DIET Certain foods raise cholesterol and others lower it. It is important to replace bad fats with other types of fat.  Do not eat:  Fatty meats, such as hot dogs and salami.  Stick margarine and some tub margarines that have "partially hydrogenated oils" in them.  Baked goods, such as cookies and crackers that have "partially hydrogenated oils" in them.  Saturated tropical oils, such as coconut and palm oil. Eat the following foods:  Round or loin cuts of red meat.  Chicken (without  skin).  Fish.  Veal.  Ground [place] breast.  Shellfish.  Fruit, such as apples.  Vegetables, such as broccoli, potatoes, and carrots.  Beans, peas, and lentils (legumes).  Grains, such as barley, rice, couscous, and bulgar wheat.  Pasta (without cream sauces). Look for foods that are nonfat, low in fat, and low in cholesterol.  FIND FOODS THAT ARE LOWER IN FAT AND CHOLESTEROL  Find foods with soluble fiber and plant sterols (phytosterol). You should eat 2 grams a day of these foods. These foods include:  Fruits.  Vegetables.  Whole grains.  Dried beans and peas.  Nuts and seeds.  Read package labels. Look for low-saturated fats, trans fat free, low-fat foods.  Choose cheese that have only 2 to 3 grams of saturated fat per ounce.  Use heart-healthy tub margarine that is free of trans fat or partially hydrogenated oil.  Avoid buying baked goods that have partially hydrogenated oils in them. Instead, buy baked goods made with whole grains (whole-wheat or whole oat flour). Avoid baked goods labeled with "flour" or "enriched flour."  Buy non-creamy canned soups with reduced salt  and no added fats. PREPARING YOUR FOOD  Broil, bake, steam, or roast foods. Do not fry food.  Use non-stick cooking sprays.  Use lemon or herbs to flavor food instead of using butter or stick margarine.  Use nonfat yogurt, salsa, or low-fat dressings for salads. LOW-SATURATED FAT / LOW-FAT FOOD SUBSTITUTES  Meats / Saturated Fat (g)  Avoid: Steak, marbled (3 oz/85 g) / 11 g.  Choose: Steak, lean (3 oz/85 g) / 4 g.  Avoid: Hamburger (3 oz/85 g) / 7 g.  Choose: Hamburger, lean (3 oz/85 g) / 5 g.  Avoid: Ham (3 oz/85 g) / 6 g.  Choose: Ham, lean cut (3 oz/85 g) / 2.4 g.  Avoid: Chicken, with skin, dark meat (3 oz/85 g) / 4 g.  Choose: Chicken, skin removed, dark meat (3 oz/85 g) / 2 g.  Avoid: Chicken, with skin, light meat (3 oz/85 g) / 2.5 g.  Choose: Chicken, skin removed, light meat (3 oz/85 g) / 1 g. Dairy / Saturated Fat (g)  Avoid: Whole milk (1 cup) / 5 g.  Choose: Low-fat milk, 2% (1 cup) / 3 g.  Choose: Low-fat milk, 1% (1 cup) / 1.5 g.  Choose: Skim milk (1 cup) / 0.3 g.  Avoid: Hard cheese (1 oz/28 g) / 6 g.  Choose: Skim milk cheese (1 oz/28 g) / 2 to 3 g.  Avoid: Cottage cheese, 4% fat (1 cup) / 6.5 g.  Choose: Low-fat cottage cheese, 1% fat (1 cup) / 1.5 g.  Avoid: Ice cream (1 cup) / 9 g.  Choose: Sherbet (1 cup) / 2.5 g.  Choose: Nonfat frozen yogurt (1 cup) / 0.3 g.  Choose: Frozen fruit bar / trace.  Avoid: Whipped cream (1 tbs) / 3.5 g.  Choose: Nondairy whipped topping (1 tbs) / 1 g. Condiments / Saturated Fat (g)  Avoid: Mayonnaise (1 tbs) / 2 g.  Choose: Low-fat mayonnaise (1 tbs) / 1 g.  Avoid: Butter (1 tbs) / 7 g.  Choose: Extra light margarine (1 tbs) / 1 g.  Avoid: Coconut oil (1 tbs) / 11.8 g.  Choose: Olive oil (1 tbs) / 1.8 g.  Choose: Corn oil (1 tbs) / 1.7 g.  Choose: Safflower oil (1 tbs) / 1.2 g.  Choose: Sunflower oil (1 tbs) / 1.4 g.  Choose: Soybean oil (1 tbs) / 2.4 g .  Choose: Canola oil (1 tbs) /  1 g. Document Released: 03/20/2012 Document Revised: 05/22/2013 Document Reviewed: 12/19/2013 [facility] Patient Information 2015 [place], [place]. This information is not intended to replace advice given to you by your health care provider. Make sure you discuss any questions you have with your health care provider.  Health Maintenance Adopting a healthy lifestyle and getting preventive care can go a long way to promote health and wellness. Talk with your health care provider about what schedule of regular examinations is right for you. This is a good chance for you to check in with your provider about disease prevention and staying healthy. In between checkups, there are plenty of things you can do on your own. Experts have done a lot of research about which lifestyle changes and preventive measures are most likely to keep you healthy. Ask your health care provider for more information. WEIGHT AND DIET  Eat a healthy diet  Be sure to include plenty of vegetables, fruits, low-fat dairy products, and lean protein.  Do not eat a lot of foods high in solid fats, added sugars, or salt.  Get regular exercise. This is one of the most important things you can do for your health.  Most adults should exercise for at least 150 minutes each week. The exercise should increase your heart rate and make you sweat (moderate-intensity exercise).  Most adults should also do strengthening exercises at least twice a week. This is in addition to the moderate-intensity exercise.  Maintain a healthy weight  Body mass index (BMI) is a measurement that can be used to identify possible weight problems. It estimates body fat based on height and weight. Your health care provider can help determine your BMI and help you achieve or maintain a healthy weight.  For females 71 years of age and older:   A BMI below 18.5 is considered underweight.  A BMI of 18.5 to 24.9 is normal.  A BMI of 25 to 29.9 is considered  overweight.  A BMI of 30 and above is considered obese.  Watch levels of cholesterol and blood lipids  You should start having your blood tested for lipids and cholesterol at 68 years of age, then have this test every 5 years.  You may need to have your cholesterol levels checked more often if:  Your lipid or cholesterol levels are high.  You are older than 68 years of age.  You are at high risk for heart disease.  CANCER SCREENING   Lung Cancer  Lung cancer screening is recommended for adults 6-74 years old who are at high risk for lung cancer because of a history of smoking.  A yearly low-dose CT scan of the lungs is recommended for people who:  Currently smoke.  Have quit within the past 15 years.  Have at least a 30-pack-year history of smoking. A pack year is smoking an average of one pack of cigarettes a day for 1 year.  Yearly screening should continue until it has been 15 years since you quit.  Yearly screening should stop if you develop a health problem that would prevent you from having lung cancer treatment.  Breast Cancer  Practice breast self-awareness. This means understanding how your breasts normally appear and feel.  It also means doing regular breast self-exams. Let your health care provider know about any changes, no matter how small.  If you are in your 20s or 30s, you should have a clinical breast exam (CBE) by a health care provider  every 1-3 years as part of a regular health exam.  If you are 1 or older, have a CBE every year. Also consider having a breast X-ray (mammogram) every year.  If you have a family history of breast cancer, talk to your health care provider about genetic screening.  If you are at high risk for breast cancer, talk to your health care provider about having an MRI and a mammogram every year.  Breast cancer gene (BRCA) assessment is recommended for women who have family members with BRCA-related cancers. BRCA-related  cancers include:  Breast.  Ovarian.  Tubal.  Peritoneal cancers.  Results of the assessment will determine the need for genetic counseling and BRCA1 and BRCA2 testing. Cervical Cancer Routine pelvic examinations to screen for cervical cancer are no longer recommended for nonpregnant women who are considered low risk for cancer of the pelvic organs (ovaries, uterus, and vagina) and who do not have symptoms. A pelvic examination may be necessary if you have symptoms including those associated with pelvic infections. Ask your health care provider if a screening pelvic exam is right for you.   The Pap test is the screening test for cervical cancer for women who are considered at risk.  If you had a hysterectomy for a problem that was not cancer or a condition that could lead to cancer, then you no longer need Pap tests.  If you are older than 65 years, and you have had normal Pap tests for the past 10 years, you no longer need to have Pap tests.  If you have had past treatment for cervical cancer or a condition that could lead to cancer, you need Pap tests and screening for cancer for at least 20 years after your treatment.  If you no longer get a Pap test, assess your risk factors if they change (such as having a new sexual partner). This can affect whether you should start being screened again.  Some women have medical problems that increase their chance of getting cervical cancer. If this is the case for you, your health care provider may recommend more frequent screening and Pap tests.  The human papillomavirus (HPV) test is another test that may be used for cervical cancer screening. The HPV test looks for the virus that can cause cell changes in the cervix. The cells collected during the Pap test can be tested for HPV.  The HPV test can be used to screen women 60 years of age and older. Getting tested for HPV can extend the interval between normal Pap tests from three to five  years.  An HPV test also should be used to screen women of any age who have unclear Pap test results.  After 68 years of age, women should have HPV testing as often as Pap tests.  Colorectal Cancer  This type of cancer can be detected and often prevented.  Routine colorectal cancer screening usually begins at 68 years of age and continues through 68 years of age.  Your health care provider may recommend screening at an earlier age if you have risk factors for colon cancer.  Your health care provider may also recommend using home test kits to check for hidden blood in the stool.  A small camera at the end of a tube can be used to examine your colon directly (sigmoidoscopy or colonoscopy). This is done to check for the earliest forms of colorectal cancer.  Routine screening usually begins at age 82.  Direct examination of the colon should  be repeated every 5-10 years through 68 years of age. However, you may need to be screened more often if early forms of precancerous polyps or small growths are found. Skin Cancer  Check your skin from head to toe regularly.  Tell your health care provider about any new moles or changes in moles, especially if there is a change in a mole's shape or color.  Also tell your health care provider if you have a mole that is larger than the size of a pencil eraser.  Always use sunscreen. Apply sunscreen liberally and repeatedly throughout the day.  Protect yourself by wearing long sleeves, pants, a wide-brimmed hat, and sunglasses whenever you are outside. HEART DISEASE, DIABETES, AND HIGH BLOOD PRESSURE   Have your blood pressure checked at least every 1-2 years. High blood pressure causes heart disease and increases the risk of stroke.  If you are between 74 years and 20 years old, ask your health care provider if you should take aspirin to prevent strokes.  Have regular diabetes screenings. This involves taking a blood sample to check your fasting  blood sugar level.  If you are at a normal weight and have a low risk for diabetes, have this test once every three years after 68 years of age.  If you are overweight and have a high risk for diabetes, consider being tested at a younger age or more often. PREVENTING INFECTION  Hepatitis B  If you have a higher risk for hepatitis B, you should be screened for this virus. You are considered at high risk for hepatitis B if:  You were born in a country where hepatitis B is common. Ask your health care provider which countries are considered high risk.  Your parents were born in a high-risk country, and you have not been immunized against hepatitis B (hepatitis B vaccine).  You have HIV or AIDS.  You use needles to inject street drugs.  You live with someone who has hepatitis B.  You have had sex with someone who has hepatitis B.  You get hemodialysis treatment.  You take certain medicines for conditions, including cancer, organ transplantation, and autoimmune conditions. Hepatitis C  Blood testing is recommended for:  Everyone born from [birth year] through 1964.  Anyone with known risk factors for hepatitis C. Sexually transmitted infections (STIs)  You should be screened for sexually transmitted infections (STIs) including gonorrhea and chlamydia if:  You are sexually active and are younger than 68 years of age.  You are older than 68 years of age and your health care provider tells you that you are at risk for this type of infection.  Your sexual activity has changed since you were last screened and you are at an increased risk for chlamydia or gonorrhea. Ask your health care provider if you are at risk.  If you do not have HIV, but are at risk, it may be recommended that you take a prescription medicine daily to prevent HIV infection. This is called pre-exposure prophylaxis (PrEP). You are considered at risk if:  You are sexually active and do not regularly use condoms or know  the HIV status of your partner(s).  You take drugs by injection.  You are sexually active with a partner who has HIV. Talk with your health care provider about whether you are at high risk of being infected with HIV. If you choose to begin PrEP, you should first be tested for HIV. You should then be tested every 3 months for as  long as you are taking PrEP.  PREGNANCY   If you are premenopausal and you may become pregnant, ask your health care provider about preconception counseling.  If you may become pregnant, take 400 to 800 micrograms (mcg) of folic acid every day.  If you want to prevent pregnancy, talk to your health care provider about birth control (contraception). OSTEOPOROSIS AND MENOPAUSE   Osteoporosis is a disease in which the bones lose minerals and strength with aging. This can result in serious bone fractures. Your risk for osteoporosis can be identified using a bone density scan.  If you are 53 years of age or older, or if you are at risk for osteoporosis and fractures, ask your health care provider if you should be screened.  Ask your health care provider whether you should take a calcium or vitamin D supplement to lower your risk for osteoporosis.  Menopause may have certain physical symptoms and risks.  Hormone replacement therapy may reduce some of these symptoms and risks. Talk to your health care provider about whether hormone replacement therapy is right for you.  HOME CARE INSTRUCTIONS   Schedule regular health, dental, and eye exams.  Stay current with your immunizations.   Do not use any tobacco products including cigarettes, chewing tobacco, or electronic cigarettes.  If you are pregnant, do not drink alcohol.  If you are breastfeeding, limit how much and how often you drink alcohol.  Limit alcohol intake to no more than 1 drink per day for nonpregnant women. One drink equals 12 ounces of beer, 5 ounces of wine, or 1 ounces of hard liquor.  Do not  use street drugs.  Do not share needles.  Ask your health care provider for help if you need support or information about quitting drugs.  Tell your health care provider if you often feel depressed.  Tell your health care provider if you have ever been abused or do not feel safe at home. Document Released: 04/04/2011 Document Revised: 02/03/2014 Document Reviewed: 08/21/2013 [facility] Patient Information 2015 [place], [place]. This information is not intended to replace advice given to you by your health care provider. Make sure you discuss any questions you have with your health care provider.

## 2015-06-17 NOTE — Progress Notes (Signed)
   Subjective:    Patient ID: Tonya [name], female    DOB: Mar 20, 1946, 69 y.o.   MRN: [id number]  HPI Agree w/ documentation as provided by RN   Review of Systems     Objective:   Physical Exam To be done at time of CPE       Assessment & Plan:  F/u w/ me as directed

## 2015-06-17 NOTE — Progress Notes (Signed)
Subjective:   Tonya [name] is a 69 y.o. female who presents for Medicare Annual (Subsequent) preventive examination.  Review of Systems: No ROS Sleep patterns:  Sleeps approximately 8 hours per night/does not wake up during the night.  Occasional trouble with sleeping.   Home Safety/Smoke Alarms:  Feels safe at home. Lives at home with granddaughter in two story home.  2 dogs and 2 cats.   Firearm Safety:  No firearms.  Seat Belt Safety/Bike Helmet:  Always wears seat belt.   Counseling:   Eye Exam- June 2016 Dental- Last dental appointment yesterday (06/16/15), goes regularly Female:  Pap-Hysterectomy   Mammo-03/17/15-negative  Dexa scan- 04/02/12    CCS- 02/20/13- repeat in 5 years.  Would like Hep C screening-Lab ordered.  Immunization: Prevnar 13- received during this visit.   Flu vaccine: Declined until next visit.    Objective:    Vitals: BP 132/78 mmHg  Pulse 75  Temp(Src) 98.2 F (36.8 C) (Oral)  Ht 5\' 5"  (1.651 m)  Wt 163 lb 12.8 oz (74.299 kg)  BMI 27.26 kg/m2  SpO2 95%  Tobacco History  Smoking status  . Current Every Day Smoker -- 1.00 packs/day for 40 years  . Types: Cigarettes  Smokeless tobacco  . Never Used     Ready to quit: Yes Counseling given: Yes   Past Medical History  Diagnosis Date  . Hypertension   . Tobacco use disorder   . Allergy   . Osteoarthritis     knee, hips  . [name] disease     per her report from skin biopsy prior  . Plantar fasciitis     right foot   Past Surgical History  Procedure Laterality Date  . Wisdom tooth extraction    . Abdominal hysterectomy      partial  . Colonoscopy  2010  . Cholecystectomy  2003   Family History  Problem Relation Age of Onset  . Hypertension Mother   . Stroke Mother     multiple  . Heart disease Father     reportedly died of MI  . Pneumonia Brother     died of pneumonia  . Other Brother     died in surgery, reportedly due to lung disease  . Liver disease Brother      died of NASH   History  Sexual Activity  . Sexual Activity: No    Comment: lives with daughter and granddaughter    Outpatient Encounter Prescriptions as of 06/17/2015  Medication Sig  . buPROPion (WELLBUTRIN XL) 150 MG 24 hr tablet TAKE 1 TABLET (150 MG TOTAL) BY MOUTH DAILY.  . cetirizine (ZYRTEC) 10 MG tablet Take 1 tablet (10 mg total) by mouth daily.  . cycloSPORINE (RESTASIS) 0.05 % ophthalmic emulsion 1 drop 2 (two) times daily.  . fluticasone (FLONASE) 50 MCG/ACT nasal spray Place 2 sprays into both nostrils daily.  . Probiotic CAPS Take 1 capsule by mouth every other day.  . valsartan-hydrochlorothiazide (DIOVAN-HCT) 320-12.5 MG per tablet TAKE 1 TABLET BY MOUTH DAILY.   No facility-administered encounter medications on file as of 06/17/2015.    Activities of Daily Living In your present state of health, do you have any difficulty performing the following activities: 06/17/2015 08/12/2014  Hearing? N Y  Vision? N N  Difficulty concentrating or making decisions? Y N  Walking or climbing stairs? Y N  Dressing or bathing? N N  Doing errands, shopping? N N  Preparing Food and eating ? N -  Using  the Toilet? N -  In the past six months, have you accidently leaked urine? N -  Do you have problems with loss of bowel control? N -  Managing your Medications? N -  Managing your Finances? N -  Housekeeping or managing your Housekeeping? N -    Patient Care Team: Midge Minium, MD as PCP - General (Family Medicine) Katy Apo, MD as Consulting Physician (Ophthalmology) Francee Piccolo, MD as Consulting Physician (Podiatry) Iven Finn, DMD as Consulting Physician (Dentistry)-Periodontist  Dr. Randol Kern- Dentist   Assessment:  Essential Hypertension, benign- Stable.  Controlled with valsartan-hydrochlorothiazide. Encouraged heart healthy diet and exercise.  Follow up with PCP.  PVCS- asymptomatic.  No palpitations "in a long time."    Hyperlipidemia- Last Lipid  panel-08/12/14.  LDL elevated.  Pt is not on simvastatin.  States she has been working on getting cholesterol down on her own.  Discussed heart healthy diet and exercise.  Handouts given.    Exercise Activities and Dietary recommendations Current Exercise Habits:: Home exercise routine, Type of exercise: strength training/weights, Time (Minutes): 50, Frequency (Times/Week): 3, Weekly Exercise (Minutes/Week): 150   Diet: 1 cup of regular coffee in the morning.  Mostly cooks food.  Eats on [employer].  Eats some fried foods and at least 1 dessert per day.  Drinks 1-3 12oz cups of regular Pepsi.  States she could improve on water intake.   Goals    . Lose 5 lbs by next year.      . Reduce sugar intake       Fall Risk Fall Risk  06/17/2015 08/12/2014  Falls in the past year? No No   Depression Screen PHQ 2/9 Scores 06/17/2015 08/12/2014  PHQ - 2 Score 1 0     Cognitive Testing MMSE - Mini Mental State Exam 06/17/2015  Orientation to time 5  Orientation to Place 5  Registration 3  Attention/ Calculation 5  Recall 1  Language- name 2 objects 2  Language- repeat 1  Language- follow 3 step command 3  Language- read & follow direction 1  Write a sentence 1  Copy design 1  Total score 28    Immunization History  Administered Date(s) Administered  . Influenza Split 11/08/2011  . Influenza,inj,Quad PF,36+ Mos 08/12/2014  . Pneumococcal Conjugate-13 06/17/2015  . Pneumococcal Polysaccharide-23 09/04/2013  . Zoster 10/05/2012   Screening Tests Health Maintenance  Topic Date Due  . Hepatitis C Screening  September 14, 1946  . INFLUENZA VACCINE  05/04/2015  . TETANUS/TDAP  07/04/2015 (Originally 09/06/1966)  . MAMMOGRAM  03/16/2017  . COLONOSCOPY  02/21/2023  . DEXA SCAN  Completed  . ZOSTAVAX  Completed  . PNA vac Low Risk Adult  Completed      Plan:  Follow up with Dr. Birdie Riddle for physical and labs.    Flu shot to given during physical appointment.    Continue to  exercise and consume a healthy diet (reducing simple carb and fat intake).    During the course of the visit the patient was educated and counseled about the following appropriate screening and preventive services:   Vaccines to include Pneumoccal, Influenza, Hepatitis B, Td, Zostavax, HCV  Electrocardiogram  Cardiovascular Disease  Colorectal cancer screening  Bone density screening  Diabetes screening  Glaucoma screening  Mammography/PAP  Nutrition counseling   Patient Instructions (the written plan) was given to the patient.   Rudene Anda, RN  06/17/2015

## 2015-07-01 ENCOUNTER — Telehealth: Payer: Self-pay | Admitting: Behavioral Health

## 2015-07-01 NOTE — Telephone Encounter (Signed)
Unable to reach patient at time of Pre-Visit Call.  Left message for patient to return call when available.    

## 2015-07-02 ENCOUNTER — Encounter: Payer: Self-pay | Admitting: Family Medicine

## 2015-07-02 ENCOUNTER — Encounter (INDEPENDENT_AMBULATORY_CARE_PROVIDER_SITE_OTHER): Payer: Self-pay

## 2015-07-02 ENCOUNTER — Ambulatory Visit (INDEPENDENT_AMBULATORY_CARE_PROVIDER_SITE_OTHER): Payer: Medicare HMO | Admitting: Family Medicine

## 2015-07-02 VITALS — BP 132/82 | HR 77 | Temp 97.9°F | Resp 16 | Ht 65.0 in | Wt 166.0 lb

## 2015-07-02 DIAGNOSIS — E785 Hyperlipidemia, unspecified: Secondary | ICD-10-CM | POA: Diagnosis not present

## 2015-07-02 DIAGNOSIS — Z780 Asymptomatic menopausal state: Secondary | ICD-10-CM | POA: Diagnosis not present

## 2015-07-02 DIAGNOSIS — Z23 Encounter for immunization: Secondary | ICD-10-CM | POA: Diagnosis not present

## 2015-07-02 DIAGNOSIS — Z0000 Encounter for general adult medical examination without abnormal findings: Secondary | ICD-10-CM

## 2015-07-02 DIAGNOSIS — I10 Essential (primary) hypertension: Secondary | ICD-10-CM

## 2015-07-02 DIAGNOSIS — Z1159 Encounter for screening for other viral diseases: Secondary | ICD-10-CM | POA: Diagnosis not present

## 2015-07-02 NOTE — Progress Notes (Signed)
Pre visit review using our clinic review tool, if applicable. No additional management support is needed unless otherwise documented below in the visit note. 

## 2015-07-02 NOTE — Progress Notes (Signed)
   Subjective:    Patient ID: Tonya [name], female    DOB: 1945-11-03, 69 y.o.   MRN: [id number]  HPI CPE- pt had Medicare Wellness visit w/ RN.  UTD on colonoscopy, mammo.  Due for DEXA [facility]).  Needs Flu.  HTN- chronic problem, on Valsartan HCTZ daily.  Denies CP, SOB, HAs, visual changes, edema.  Hyperlipidemia- chronic problem.  Not on Simvastatin.  Attempting to control w/ healthy diet and regular exercise.  Podiatry is telling pt to stay off her feet.  Not exercising regularly.  Depression- taking Wellbutrin.  Currently on medication holiday while starting Meloxicam per Podiatry.  Plans to resume tonight.   Review of Systems Patient reports no vision/ hearing changes, adenopathy,fever, weight change,  persistant/recurrent hoarseness , swallowing issues, chest [name], palpitations, edema, persistant/recurrent cough, hemoptysis, dyspnea (rest/exertional/paroxysmal nocturnal), gastrointestinal bleeding (melena, rectal bleeding), abdominal [name], significant heartburn, bowel changes, GU symptoms (dysuria, hematuria, incontinence), Gyn symptoms (abnormal  bleeding, [name]),  syncope, focal weakness, memory loss, skin/hair/nail changes, abnormal bruising or bleeding, anxiety, or depression.   + numbness- seeing podiatry    Objective:   Physical Exam General Appearance:    Alert, cooperative, no distress, appears stated age  Head:    Normocephalic, without obvious abnormality, atraumatic  Eyes:    PERRL, conjunctiva/corneas clear, EOM's intact, fundi    benign, both eyes  Ears:    Normal TM's and external ear canals, both ears  Nose:   Nares normal, septum midline, mucosa normal, no drainage    or sinus tenderness  Throat:   Lips, mucosa, and tongue normal; teeth and gums normal  Neck:   Supple, symmetrical, trachea midline, no adenopathy;    Thyroid: no enlargement/tenderness/nodules  Back:     Symmetric, no curvature, ROM normal, no CVA tenderness  Lungs:     Clear to  auscultation bilaterally, respirations unlabored  Chest Wall:    No tenderness or deformity   Heart:    Regular rate and rhythm, S1 and S2 normal, no murmur, rub   or gallop  Breast Exam:    Deferred to mammo  Abdomen:     Soft, non-tender, bowel sounds active all four quadrants,    no masses, no organomegaly  Genitalia:    Deferred  Rectal:    Extremities:   Extremities normal, atraumatic, no cyanosis or edema  Pulses:   2+ and symmetric all extremities  Skin:   Skin color, texture, turgor normal, no rashes or lesions  Lymph nodes:   Cervical, supraclavicular, and axillary nodes normal  Neurologic:   CNII-XII intact, normal strength, sensation and reflexes    throughout         Assessment & Plan:

## 2015-07-02 NOTE — Patient Instructions (Signed)
Follow up in 6 months to recheck BP and cholesterol We'll notify you of your lab results and make any changes if needed Continue to work on healthy diet and regular exercise STOP SMOKING!  You can do it!! We'll call you with your bond density appt You are not due for mammogram until June 2017 and colonoscopy 2024! Call with any questions or concerns If you want to join [place] at the new [place] office, any scheduled appointments will automatically transfer and we will see you at [address] [place] [address] [place], [place] [address] Happy Fall!!!

## 2015-07-03 LAB — LIPID PANEL
CHOL/HDL RATIO: 4
CHOLESTEROL: 180 mg/dL (ref 0–200)
HDL: 47.9 mg/dL (ref >39.00)
LDL CALC: 115 mg/dL — AB (ref 0–99)
NonHDL: 132.5
Triglycerides: 87 mg/dL (ref 0.0–149.0)
VLDL: 17.4 mg/dL (ref 0.0–40.0)

## 2015-07-03 LAB — HEPATITIS C ANTIBODY: HCV Ab: NEGATIVE

## 2015-07-03 LAB — HEPATIC FUNCTION PANEL
ALK PHOS: 60 U/L (ref 39–117)
ALT: 14 U/L (ref 0–35)
AST: 15 U/L (ref 0–37)
Albumin: 4.3 g/dL (ref 3.5–5.2)
BILIRUBIN DIRECT: 0.1 mg/dL (ref 0.0–0.3)
BILIRUBIN TOTAL: 0.3 mg/dL (ref 0.2–1.2)
Total Protein: 7.3 g/dL (ref 6.0–8.3)

## 2015-07-03 LAB — CBC WITH DIFFERENTIAL/PLATELET
Basophils Absolute: 0.1 10*3/uL (ref 0.0–0.1)
Basophils Relative: 1 % (ref 0.0–3.0)
EOS PCT: 1.5 % (ref 0.0–5.0)
Eosinophils Absolute: 0.1 10*3/uL (ref 0.0–0.7)
HCT: 42 % (ref 36.0–46.0)
Hemoglobin: 13.9 g/dL (ref 12.0–15.0)
LYMPHS ABS: 3.2 10*3/uL (ref 0.7–4.0)
LYMPHS PCT: 36.8 % (ref 12.0–46.0)
MCHC: 33.2 g/dL (ref 30.0–36.0)
MCV: 94 fl (ref 78.0–100.0)
MONOS PCT: 5.1 % (ref 3.0–12.0)
Monocytes Absolute: 0.4 10*3/uL (ref 0.1–1.0)
NEUTROS ABS: 4.9 10*3/uL (ref 1.4–7.7)
NEUTROS PCT: 55.6 % (ref 43.0–77.0)
PLATELETS: 268 10*3/uL (ref 150.0–400.0)
RBC: 4.46 Mil/uL (ref 3.87–5.11)
RDW: 14.2 % (ref 11.5–15.5)
WBC: 8.7 10*3/uL (ref 4.0–10.5)

## 2015-07-03 LAB — BASIC METABOLIC PANEL
BUN: 19 mg/dL (ref 6–23)
CO2: 29 mEq/L (ref 19–32)
Calcium: 9.9 mg/dL (ref 8.4–10.5)
Chloride: 106 mEq/L (ref 96–112)
Creatinine, Ser: 0.85 mg/dL (ref 0.40–1.20)
GFR: 85.58 mL/min (ref >60.00)
Glucose, Bld: 93 mg/dL (ref 70–99)
POTASSIUM: 4.2 meq/L (ref 3.5–5.1)
SODIUM: 142 meq/L (ref 135–145)

## 2015-07-03 LAB — TSH: TSH: 0.83 u[IU]/mL (ref 0.35–4.50)

## 2015-07-03 NOTE — Assessment & Plan Note (Signed)
Chronic problem.  Adequate control.  Asymptomatic.  Check labs.  Encouraged healthy diet and regular activity.  No anticipated med changes.

## 2015-07-03 NOTE — Assessment & Plan Note (Signed)
Chronic problem.  Not currently taking statin- attempting to control w/ healthy diet.  Limited activity.  Stressed need to increase this.  Check labs.  Start meds prn.

## 2015-07-03 NOTE — Assessment & Plan Note (Signed)
Pt's PE WNL.  UTD on colonoscopy, mammo.  Due for DEXA- order entered.  Written screening schedule updated and given to pt.  Check labs.  Anticipatory guidance provided.  Flu shot given.

## 2015-07-08 DIAGNOSIS — M722 Plantar fascial fibromatosis: Secondary | ICD-10-CM | POA: Diagnosis not present

## 2015-08-13 ENCOUNTER — Other Ambulatory Visit: Payer: Self-pay | Admitting: General Practice

## 2015-08-13 MED ORDER — BUPROPION HCL ER (XL) 150 MG PO TB24: ORAL_TABLET | ORAL | Status: DC

## 2015-10-01 ENCOUNTER — Telehealth: Payer: Self-pay

## 2015-10-01 NOTE — Telephone Encounter (Signed)
Patient called with complaint of Hearing Loss in Right ear. States she was seen for this problem 2-3 months ago by Dr. Birdie Riddle. States she was told Sinuses were swollen. Advised patient that there were no available appointments today in this office. Patient states she is not having pain or drainage she mainly wants to be seen so she can have referral to ENT. Unable to schedule with Dr. Birdie Riddle at this time schedule not open. Appointment scheduled with E. Saguier for 10/07/15. Advised patient if she had any worsening symptoms to go directly to UC or ED. Patient agrees.

## 2015-10-07 ENCOUNTER — Ambulatory Visit (INDEPENDENT_AMBULATORY_CARE_PROVIDER_SITE_OTHER): Payer: Medicare HMO | Admitting: Medical

## 2015-10-07 ENCOUNTER — Encounter: Payer: Self-pay | Admitting: Medical

## 2015-10-07 VITALS — BP 126/74 | HR 70 | Temp 97.7°F | Ht 65.0 in | Wt 167.6 lb

## 2015-10-07 DIAGNOSIS — J309 Allergic rhinitis, unspecified: Secondary | ICD-10-CM

## 2015-10-07 DIAGNOSIS — H6983 Other specified disorders of Eustachian tube, bilateral: Secondary | ICD-10-CM

## 2015-10-07 DIAGNOSIS — H9191 Unspecified hearing loss, right ear: Secondary | ICD-10-CM | POA: Diagnosis not present

## 2015-10-07 NOTE — Patient Instructions (Addendum)
For probable persisting eustachian tube dysfunction with associated nasal congestion, advise continue antihistamine and nasal steroid. Recommend use nasal steroid daily. Since symptoms since august will refer to ENT.  Your hearing screening was ok today but since you describe functional difference I do think referring to ENT that has audiologist in office would be a good idea.  Follow up in with [place] as needed prior to or after specialist referrals.  Regarding cost of your probiotics, I would get advise of our pharmacy on what they consider to be best probiotic they have in stock and what is cash cost. Then ask your GI MD office if this is reasonable alternative.

## 2015-10-07 NOTE — Progress Notes (Signed)
Subjective:    Patient ID: Tonya [name], female    DOB: [birth year], 70 y.o.   MRN: [id number]  HPI  Pt in for referral for hearing loss. Pt states on visit this past august had eustachian tube dysfunction. Pt got rx  Antihistamine and Flonase. Her nasal congestion symptoms did seem to improve. Pt ear pressure did improve some. Pt had some hearing difficulty back then and thought may have been that eustachian tube dysfunction and allergy were related cause.  Pt states before august she felt like she was not hearing adequately.  Pt states still some residual pressure in her rt ear.  Pt at very end of interview mentioned at end she had some bloating. She states GI she saw recommended. Pt states recommended probiotic but cost $80 a month. She wants to know if more reasonable alternative.   Review of Systems  Constitutional: Negative for fever, chills and fatigue.  HENT: Positive for congestion. Negative for drooling, ear [name], facial swelling and nosebleeds.        Mild congestion and ear pressure. Decreased hearing. Rt hear. Could not hear dial tone of phone on rt side one time and could hear on left side.  Respiratory: Negative for cough, choking, chest tightness, shortness of breath and wheezing.   Cardiovascular: Negative for chest [name] and palpitations.  Gastrointestinal: Negative for nausea, abdominal [name], diarrhea and constipation.       See gi for abdomen bloating.  Hematological: Negative for adenopathy. Does not bruise/bleed easily.   Past Medical History  Diagnosis Date  . Hypertension   . Tobacco use disorder   . Allergy   . Osteoarthritis     knee, hips  . [name] disease     per her report from skin biopsy prior  . Plantar fasciitis     right foot  . Hyperlipidemia     Social History   Social History  . Marital Status: Single    Spouse Name: N/A  . Number of Children: N/A  . Years of Education: N/A   Occupational History  . Not on file.    Social History Main Topics  . Smoking status: Current Every Day Smoker -- 1.00 packs/day for 40 years    Types: Cigarettes  . Smokeless tobacco: Never Used  . Alcohol Use: 0.0 oz/week    0 Standard drinks or equivalent per week     Comment: "Once and awhile"  . Drug Use: No  . Sexual Activity: No     Comment: lives with daughter and granddaughter   Other Topics Concern  . Not on file   Social History Narrative    Past Surgical History  Procedure Laterality Date  . Wisdom tooth extraction    . Abdominal hysterectomy      partial  . Colonoscopy  2010  . Cholecystectomy  2003    Family History  Problem Relation Age of Onset  . Hypertension Mother   . Stroke Mother     multiple  . Heart disease Father     reportedly died of MI  . Pneumonia Brother     died of pneumonia  . Other Brother     died in surgery, reportedly due to lung disease  . Liver disease Brother     died of NASH    Allergies  Allergen Reactions  . Penicillins Rash    Current Outpatient Prescriptions on File Prior to Visit  Medication Sig Dispense Refill  . buPROPion (WELLBUTRIN XL) 150 MG 24  hr tablet TAKE 1 TABLET (150 MG TOTAL) BY MOUTH DAILY. 90 tablet 1  . cetirizine (ZYRTEC) 10 MG tablet Take 1 tablet (10 mg total) by mouth daily. 30 tablet 11  . cycloSPORINE (RESTASIS) 0.05 % ophthalmic emulsion 1 drop 2 (two) times daily.    . fluticasone (FLONASE) 50 MCG/ACT nasal spray Place 2 sprays into both nostrils daily. 16 g 6  . meloxicam (MOBIC) 15 MG tablet TAKE 1 TABLET BY MOUTH ONCE DAILY WITH FOOD  0  . Probiotic CAPS Take 1 capsule by mouth every other day.    . valsartan-hydrochlorothiazide (DIOVAN-HCT) 320-12.5 MG per tablet TAKE 1 TABLET BY MOUTH DAILY. 90 tablet 0   No current facility-administered medications on file prior to visit.    BP 126/74 mmHg  Pulse 70  Temp(Src) 97.7 F (36.5 C) (Oral)  Ht 5\' 5"  (1.651 m)  Wt 167 lb 9.6 oz (76.023 kg)  BMI 27.89 kg/m2  SpO2  96%       Objective:   Physical Exam  General  Mental Status - Alert. General Appearance - Well groomed. Not in acute distress.  Skin Rashes- No Rashes.  HEENT Head- Normal. Ear Auditory Canal - Left- Normal. Right - Normal.Tympanic Membrane- Left- Normal. Right- Normal. Eye Sclera/Conjunctiva- Left- Normal. Right- Normal. Nose & Sinuses Nasal Mucosa- Left-  Boggy and Congested. Right-  Boggy and  Congested.Bilateral no  maxillary and no frontal sinus pressure. Mouth & Throat Lips: Upper Lip- Normal: no dryness, cracking, pallor, cyanosis, or vesicular eruption. Lower Lip-Normal: no dryness, cracking, pallor, cyanosis or vesicular eruption. Buccal Mucosa- Bilateral- No Aphthous ulcers. Oropharynx- No Discharge or Erythema. Tonsils: Characteristics- Bilateral- No Erythema or Congestion. Size/Enlargement- Bilateral- No enlargement. Discharge- bilateral-None.  Neck Neck- Supple. No Masses.   Chest and Lung Exam Auscultation: Breath Sounds:-Clear even and unlabored.  Cardiovascular Auscultation:Rythm- Regular, rate and rhythm. Murmurs & Other Heart Sounds:Ausculatation of the heart reveal- No Murmurs.  Lymphatic Head & Neck General Head & Neck Lymphatics: Bilateral: Description- No Localized lymphadenopathy.       Assessment & Plan:  For probable persisting eustachian tube dysfunction with associated nasal congestion, advise continue antihistamine and nasal steroid. Recommend use nasal steroid daily. Since symptoms since august will refer to ENT.  Your hearing screening was ok today but since you describe functional difference I do think referring to ENT that has audiologist in office would be a good idea.  Follow up in with [place] as needed prior to or after specialist referrals.  Regarding cost of your probiotics, I would get advise of our pharmacy on what they consider to be best probiotic they have in stock and what is cash cost. Then ask your GI MD office if this is  reasonable alternative.

## 2015-10-07 NOTE — Progress Notes (Signed)
Pre visit review using our clinic review tool, if applicable. No additional management support is needed unless otherwise documented below in the visit note. 

## 2015-10-20 DIAGNOSIS — H9121 Sudden idiopathic hearing loss, right ear: Secondary | ICD-10-CM | POA: Diagnosis not present

## 2015-10-20 DIAGNOSIS — H9041 Sensorineural hearing loss, unilateral, right ear, with unrestricted hearing on the contralateral side: Secondary | ICD-10-CM | POA: Diagnosis not present

## 2015-10-20 DIAGNOSIS — H9122 Sudden idiopathic hearing loss, left ear: Secondary | ICD-10-CM | POA: Diagnosis not present

## 2015-11-06 DIAGNOSIS — H9122 Sudden idiopathic hearing loss, left ear: Secondary | ICD-10-CM | POA: Diagnosis not present

## 2015-11-06 DIAGNOSIS — H9121 Sudden idiopathic hearing loss, right ear: Secondary | ICD-10-CM | POA: Diagnosis not present

## 2015-11-06 DIAGNOSIS — H903 Sensorineural hearing loss, bilateral: Secondary | ICD-10-CM | POA: Diagnosis not present

## 2015-11-12 DIAGNOSIS — H1033 Unspecified acute conjunctivitis, bilateral: Secondary | ICD-10-CM | POA: Diagnosis not present

## 2015-11-26 DIAGNOSIS — H04123 Dry eye syndrome of bilateral lacrimal glands: Secondary | ICD-10-CM | POA: Diagnosis not present

## 2015-12-24 DIAGNOSIS — M722 Plantar fascial fibromatosis: Secondary | ICD-10-CM | POA: Diagnosis not present

## 2016-01-06 ENCOUNTER — Encounter: Payer: Self-pay | Admitting: Family Medicine

## 2016-01-06 ENCOUNTER — Ambulatory Visit (INDEPENDENT_AMBULATORY_CARE_PROVIDER_SITE_OTHER): Payer: Medicare HMO | Admitting: Family Medicine

## 2016-01-06 VITALS — BP 126/74 | HR 81 | Temp 98.1°F | Resp 16 | Ht 65.0 in | Wt 165.2 lb

## 2016-01-06 DIAGNOSIS — E785 Hyperlipidemia, unspecified: Secondary | ICD-10-CM

## 2016-01-06 DIAGNOSIS — I10 Essential (primary) hypertension: Secondary | ICD-10-CM

## 2016-01-06 LAB — CBC WITH DIFFERENTIAL/PLATELET
BASOS PCT: 0.5 % (ref 0.0–3.0)
Basophils Absolute: 0 10*3/uL (ref 0.0–0.1)
Eosinophils Absolute: 0.2 10*3/uL (ref 0.0–0.7)
Eosinophils Relative: 3.4 % (ref 0.0–5.0)
HCT: 41.2 % (ref 36.0–46.0)
Hemoglobin: 13.9 g/dL (ref 12.0–15.0)
LYMPHS ABS: 2.7 10*3/uL (ref 0.7–4.0)
LYMPHS PCT: 43.9 % (ref 12.0–46.0)
MCHC: 33.7 g/dL (ref 30.0–36.0)
MCV: 92.3 fl (ref 78.0–100.0)
Monocytes Absolute: 0.4 10*3/uL (ref 0.1–1.0)
Monocytes Relative: 6.6 % (ref 3.0–12.0)
NEUTROS ABS: 2.8 10*3/uL (ref 1.4–7.7)
Neutrophils Relative %: 45.6 % (ref 43.0–77.0)
PLATELETS: 285 10*3/uL (ref 150.0–400.0)
RBC: 4.46 Mil/uL (ref 3.87–5.11)
RDW: 14.3 % (ref 11.5–15.5)
WBC: 6.1 10*3/uL (ref 4.0–10.5)

## 2016-01-06 LAB — LIPID PANEL
CHOLESTEROL: 184 mg/dL (ref 0–200)
HDL: 37.3 mg/dL — AB (ref >39.00)
LDL Cholesterol: 126 mg/dL — ABNORMAL HIGH (ref 0–99)
NonHDL: 146.75
TRIGLYCERIDES: 103 mg/dL (ref 0.0–149.0)
Total CHOL/HDL Ratio: 5
VLDL: 20.6 mg/dL (ref 0.0–40.0)

## 2016-01-06 LAB — HEPATIC FUNCTION PANEL
ALT: 12 U/L (ref 0–35)
AST: 17 U/L (ref 0–37)
Albumin: 4.3 g/dL (ref 3.5–5.2)
Alkaline Phosphatase: 41 U/L (ref 39–117)
Bilirubin, Direct: 0.1 mg/dL (ref 0.0–0.3)
TOTAL PROTEIN: 7.1 g/dL (ref 6.0–8.3)
Total Bilirubin: 0.4 mg/dL (ref 0.2–1.2)

## 2016-01-06 LAB — BASIC METABOLIC PANEL
BUN: 21 mg/dL (ref 6–23)
CHLORIDE: 106 meq/L (ref 96–112)
CO2: 31 meq/L (ref 19–32)
CREATININE: 0.7 mg/dL (ref 0.40–1.20)
Calcium: 10.2 mg/dL (ref 8.4–10.5)
GFR: 106.91 mL/min (ref >60.00)
Glucose, Bld: 99 mg/dL (ref 70–99)
POTASSIUM: 4 meq/L (ref 3.5–5.1)
Sodium: 142 mEq/L (ref 135–145)

## 2016-01-06 NOTE — Progress Notes (Signed)
Pre visit review using our clinic review tool, if applicable. No additional management support is needed unless otherwise documented below in the visit note. 

## 2016-01-06 NOTE — Assessment & Plan Note (Signed)
Ongoing issue for pt.  Attempting to control w/ healthy diet and regular exercise.  Check labs.  Start meds prn

## 2016-01-06 NOTE — Assessment & Plan Note (Signed)
Chronic problem.  Well controlled on current medications.  Asymptomatic.  Encouraged smoking cessation.  Check labs.  No anticipated med changes.

## 2016-01-06 NOTE — Patient Instructions (Signed)
Schedule your complete physical in 6 months We'll notify you of your lab results and make any changes if needed Continue to work on healthy diet and regular exercise- you look great! Call with any questions or concerns Thanks for sticking with [place]! Happy Spring!!!

## 2016-01-06 NOTE — Progress Notes (Signed)
   Subjective:    Patient ID: Tonya [name], female    DOB: [birth year], 70 y.o.   MRN: [id number]  HPI HTN- chronic problem, well controlled on Valsartan HCTZ daily.  No CP, SOB, HAs, visual changes, edema.  Hyperlipidemia- ongoing issue.  Attempting to control w/ healthy diet and regular exercise.  Has lost 3 lbs since Jan.  Denies abd [name], N/V, myalgias.  Exercising regularly- gym 3-4x/week.   Review of Systems For ROS see HPI     Objective:   Physical Exam  Constitutional: She is oriented to person, place, and time. She appears well-developed and well-nourished. No distress.  HENT:  Head: Normocephalic and atraumatic.  Eyes: Conjunctivae and EOM are normal. Pupils are equal, round, and reactive to light.  Neck: Normal range of motion. Neck supple. No thyromegaly present.  Cardiovascular: Normal rate, regular rhythm, normal heart sounds and intact distal pulses.   No murmur heard. Pulmonary/Chest: Effort normal and breath sounds normal. No respiratory distress.  Abdominal: Soft. She exhibits no distension. There is no tenderness.  Musculoskeletal: She exhibits no edema.  Lymphadenopathy:    She has no cervical adenopathy.  Neurological: She is alert and oriented to person, place, and time.  Skin: Skin is warm and dry.  Psychiatric: She has a normal mood and affect. Her behavior is normal.  Vitals reviewed.         Assessment & Plan:

## 2016-01-07 ENCOUNTER — Encounter: Payer: Self-pay | Admitting: General Practice

## 2016-02-02 ENCOUNTER — Other Ambulatory Visit: Payer: Self-pay

## 2016-02-02 DIAGNOSIS — Z1231 Encounter for screening mammogram for malignant neoplasm of breast: Secondary | ICD-10-CM

## 2016-02-03 ENCOUNTER — Encounter: Payer: Self-pay | Admitting: Gastroenterology

## 2016-02-17 ENCOUNTER — Other Ambulatory Visit: Payer: Self-pay | Admitting: General Practice

## 2016-02-17 MED ORDER — BUPROPION HCL ER (XL) 150 MG PO TB24: ORAL_TABLET | ORAL | Status: DC

## 2016-02-23 ENCOUNTER — Ambulatory Visit: Payer: Medicare HMO | Admitting: Gastroenterology

## 2016-03-18 ENCOUNTER — Ambulatory Visit
Admission: RE | Admit: 2016-03-18 | Discharge: 2016-03-18 | Disposition: A | Discharge status: Home / Self Care | Payer: Medicare HMO | Source: Ambulatory Visit

## 2016-03-18 DIAGNOSIS — Z1231 Encounter for screening mammogram for malignant neoplasm of breast: Secondary | ICD-10-CM | POA: Diagnosis not present

## 2016-03-24 DIAGNOSIS — H5213 Myopia, bilateral: Secondary | ICD-10-CM | POA: Diagnosis not present

## 2016-03-24 DIAGNOSIS — H40011 Open angle with borderline findings, low risk, right eye: Secondary | ICD-10-CM | POA: Diagnosis not present

## 2016-03-24 DIAGNOSIS — H40012 Open angle with borderline findings, low risk, left eye: Secondary | ICD-10-CM | POA: Diagnosis not present

## 2016-03-24 DIAGNOSIS — H25013 Cortical age-related cataract, bilateral: Secondary | ICD-10-CM | POA: Diagnosis not present

## 2016-04-11 ENCOUNTER — Ambulatory Visit: Payer: Medicare HMO | Admitting: Family Medicine

## 2016-04-11 DIAGNOSIS — Z0289 Encounter for other administrative examinations: Secondary | ICD-10-CM

## 2016-04-13 ENCOUNTER — Encounter: Payer: Self-pay | Admitting: Family Medicine

## 2016-04-13 ENCOUNTER — Telehealth: Payer: Self-pay | Admitting: Family Medicine

## 2016-04-13 NOTE — Telephone Encounter (Signed)
Yes- please charge 

## 2016-04-13 NOTE — Telephone Encounter (Signed)
Patient was No Show 7/10. Charge or No Charge?

## 2016-04-19 ENCOUNTER — Ambulatory Visit (INDEPENDENT_AMBULATORY_CARE_PROVIDER_SITE_OTHER): Payer: Medicare HMO | Admitting: Family Medicine

## 2016-04-19 ENCOUNTER — Encounter: Payer: Self-pay | Admitting: Family Medicine

## 2016-04-19 VITALS — BP 138/78 | HR 90 | Temp 97.9°F | Resp 16 | Ht 65.0 in | Wt 161.1 lb

## 2016-04-19 DIAGNOSIS — M79644 Pain in right finger(s): Secondary | ICD-10-CM | POA: Diagnosis not present

## 2016-04-19 MED ORDER — MELOXICAM 15 MG PO TABS: ORAL_TABLET | ORAL | Status: DC

## 2016-04-19 NOTE — Patient Instructions (Signed)
Follow up as needed Start the Meloxicam once daily- take w/ food- for 7-10 days and then as needed for pain If no improvement, please let me know and we'll refer you to a hand specialist [facility] for pain relief Call with any questions or concerns Hang in there!!!

## 2016-04-19 NOTE — Progress Notes (Signed)
Pre visit review using our clinic review tool, if applicable. No additional management support is needed unless otherwise documented below in the visit note. 

## 2016-04-19 NOTE — Progress Notes (Signed)
   Subjective:    Patient ID: Tonya [name], female    DOB: Mar 28, 1946, 70 y.o.   MRN: [id number]  HPI R thumb [name]- pt initially thought [name] was arthritis 'but I didn't have [name] like this'.  [name] started ~1 month ago.  No [name] at rest but [name] w/ any type of movement.  Intermittent swelling.  Pt is R handed.  No known injury.  Not currently taking anything for [name].   Pt's [name] is reported at R 1st MCP joints   Review of Systems For ROS see HPI     Objective:   Physical Exam  Constitutional: She is oriented to person, place, and time. She appears well-developed and well-nourished. No distress.  Cardiovascular: Intact distal pulses.   Musculoskeletal: She exhibits no edema or tenderness (no TTP over R 1st MCP joint which is pt's reported site of [name]).  Degenerative changes noted at R 1st MCP joint Full ROM of R thumb, no [name] w/ abduction, adduction, MCP flexion, extension  Neurological: She is alert and oriented to person, place, and time.  Skin: Skin is warm and dry. No erythema.  Vitals reviewed.         Assessment & Plan:  R thumb [name]- new.  Pt w/ obvious degenerative changes of R 1st MCP joint.  No [name] on exam today and [name] not reproducible w/ motion.  Start daily NSAID x7-10 days and then take PRN.  If no improvement will need referral to hand.  Reviewed supportive care and red flags that should prompt return.  Pt expressed understanding and is in agreement w/ plan.

## 2016-04-21 DIAGNOSIS — M7662 Achilles tendinitis, left leg: Secondary | ICD-10-CM | POA: Diagnosis not present

## 2016-04-21 DIAGNOSIS — M722 Plantar fascial fibromatosis: Secondary | ICD-10-CM | POA: Diagnosis not present

## 2016-04-21 DIAGNOSIS — M25775 Osteophyte, left foot: Secondary | ICD-10-CM | POA: Diagnosis not present

## 2016-06-13 ENCOUNTER — Telehealth: Payer: Self-pay | Admitting: Family Medicine

## 2016-06-13 NOTE — Telephone Encounter (Signed)
Pt states that she received a no show fee from 04/11/16 and is upset that she did not received a reminder call about the appt. When I tried to explain that the appt was made on Thurs. 04/07/16 and that did not allow time for a reminder call, pt got upset and said she is not paying this $50.00. I advised her I would let KT know.

## 2016-06-14 NOTE — Telephone Encounter (Signed)
Noted.  Will forward to management for decision on No-Show fee

## 2016-06-22 ENCOUNTER — Other Ambulatory Visit: Payer: Self-pay | Admitting: Family Medicine

## 2016-07-12 ENCOUNTER — Ambulatory Visit: Payer: Medicare HMO | Admitting: Family Medicine

## 2016-09-05 ENCOUNTER — Other Ambulatory Visit: Payer: Self-pay | Admitting: Family Medicine

## 2016-11-07 DIAGNOSIS — I10 Essential (primary) hypertension: Secondary | ICD-10-CM | POA: Diagnosis not present

## 2016-11-07 DIAGNOSIS — J309 Allergic rhinitis, unspecified: Secondary | ICD-10-CM | POA: Diagnosis not present

## 2016-12-08 DIAGNOSIS — M79601 Pain in right arm: Secondary | ICD-10-CM | POA: Diagnosis not present

## 2016-12-27 DIAGNOSIS — Z720 Tobacco use: Secondary | ICD-10-CM | POA: Diagnosis not present

## 2016-12-27 DIAGNOSIS — Z01419 Encounter for gynecological examination (general) (routine) without abnormal findings: Secondary | ICD-10-CM | POA: Diagnosis not present

## 2016-12-27 DIAGNOSIS — Z6825 Body mass index (BMI) 25.0-25.9, adult: Secondary | ICD-10-CM | POA: Diagnosis not present

## 2016-12-27 DIAGNOSIS — R21 Rash and other nonspecific skin eruption: Secondary | ICD-10-CM | POA: Diagnosis not present

## 2017-02-06 ENCOUNTER — Other Ambulatory Visit: Payer: Self-pay | Admitting: Family Medicine

## 2017-02-06 DIAGNOSIS — Z1231 Encounter for screening mammogram for malignant neoplasm of breast: Secondary | ICD-10-CM

## 2017-03-22 DIAGNOSIS — Z1231 Encounter for screening mammogram for malignant neoplasm of breast: Secondary | ICD-10-CM | POA: Diagnosis not present

## 2017-03-30 DIAGNOSIS — H5213 Myopia, bilateral: Secondary | ICD-10-CM | POA: Diagnosis not present

## 2017-03-30 DIAGNOSIS — H40011 Open angle with borderline findings, low risk, right eye: Secondary | ICD-10-CM | POA: Diagnosis not present

## 2017-03-30 DIAGNOSIS — H401122 Primary open-angle glaucoma, left eye, moderate stage: Secondary | ICD-10-CM | POA: Diagnosis not present

## 2017-05-11 DIAGNOSIS — H40011 Open angle with borderline findings, low risk, right eye: Secondary | ICD-10-CM | POA: Diagnosis not present

## 2017-05-19 DIAGNOSIS — J309 Allergic rhinitis, unspecified: Secondary | ICD-10-CM | POA: Diagnosis not present

## 2017-05-19 DIAGNOSIS — I10 Essential (primary) hypertension: Secondary | ICD-10-CM | POA: Diagnosis not present

## 2017-05-19 DIAGNOSIS — E7800 Pure hypercholesterolemia, unspecified: Secondary | ICD-10-CM | POA: Diagnosis not present

## 2017-05-19 DIAGNOSIS — R69 Illness, unspecified: Secondary | ICD-10-CM | POA: Diagnosis not present

## 2017-05-19 DIAGNOSIS — Z0000 Encounter for general adult medical examination without abnormal findings: Secondary | ICD-10-CM | POA: Diagnosis not present

## 2017-09-21 DIAGNOSIS — Z23 Encounter for immunization: Secondary | ICD-10-CM | POA: Diagnosis not present

## 2017-11-16 DIAGNOSIS — H401131 Primary open-angle glaucoma, bilateral, mild stage: Secondary | ICD-10-CM | POA: Diagnosis not present

## 2017-11-16 DIAGNOSIS — H2513 Age-related nuclear cataract, bilateral: Secondary | ICD-10-CM | POA: Diagnosis not present

## 2017-11-16 DIAGNOSIS — H25013 Cortical age-related cataract, bilateral: Secondary | ICD-10-CM | POA: Diagnosis not present

## 2017-11-21 DIAGNOSIS — I10 Essential (primary) hypertension: Secondary | ICD-10-CM | POA: Diagnosis not present

## 2017-11-21 DIAGNOSIS — J309 Allergic rhinitis, unspecified: Secondary | ICD-10-CM | POA: Diagnosis not present

## 2017-12-21 DIAGNOSIS — I10 Essential (primary) hypertension: Secondary | ICD-10-CM | POA: Diagnosis not present

## 2018-01-18 DIAGNOSIS — Z1211 Encounter for screening for malignant neoplasm of colon: Secondary | ICD-10-CM | POA: Diagnosis not present

## 2018-01-18 DIAGNOSIS — K5730 Diverticulosis of large intestine without perforation or abscess without bleeding: Secondary | ICD-10-CM | POA: Diagnosis not present

## 2018-01-18 DIAGNOSIS — R140 Abdominal distension (gaseous): Secondary | ICD-10-CM | POA: Diagnosis not present

## 2018-01-25 DIAGNOSIS — I10 Essential (primary) hypertension: Secondary | ICD-10-CM | POA: Diagnosis not present

## 2018-01-25 DIAGNOSIS — R69 Illness, unspecified: Secondary | ICD-10-CM | POA: Diagnosis not present

## 2018-02-12 DIAGNOSIS — Z1211 Encounter for screening for malignant neoplasm of colon: Secondary | ICD-10-CM | POA: Diagnosis not present

## 2018-02-12 DIAGNOSIS — K635 Polyp of colon: Secondary | ICD-10-CM | POA: Diagnosis not present

## 2018-02-12 DIAGNOSIS — Z86010 Personal history of colonic polyps: Secondary | ICD-10-CM | POA: Diagnosis not present

## 2018-02-12 DIAGNOSIS — D125 Benign neoplasm of sigmoid colon: Secondary | ICD-10-CM | POA: Diagnosis not present

## 2018-02-12 DIAGNOSIS — K5730 Diverticulosis of large intestine without perforation or abscess without bleeding: Secondary | ICD-10-CM | POA: Diagnosis not present

## 2018-02-15 DIAGNOSIS — J029 Acute pharyngitis, unspecified: Secondary | ICD-10-CM | POA: Diagnosis not present

## 2018-02-20 ENCOUNTER — Encounter: Payer: Self-pay | Admitting: General Practice

## 2018-05-17 DIAGNOSIS — H401131 Primary open-angle glaucoma, bilateral, mild stage: Secondary | ICD-10-CM | POA: Diagnosis not present

## 2018-06-05 DIAGNOSIS — J309 Allergic rhinitis, unspecified: Secondary | ICD-10-CM | POA: Diagnosis not present

## 2018-06-05 DIAGNOSIS — Z136 Encounter for screening for cardiovascular disorders: Secondary | ICD-10-CM | POA: Diagnosis not present

## 2018-06-05 DIAGNOSIS — E7800 Pure hypercholesterolemia, unspecified: Secondary | ICD-10-CM | POA: Diagnosis not present

## 2018-06-05 DIAGNOSIS — Z0000 Encounter for general adult medical examination without abnormal findings: Secondary | ICD-10-CM | POA: Diagnosis not present

## 2018-06-05 DIAGNOSIS — I10 Essential (primary) hypertension: Secondary | ICD-10-CM | POA: Diagnosis not present

## 2018-06-05 DIAGNOSIS — Z23 Encounter for immunization: Secondary | ICD-10-CM | POA: Diagnosis not present

## 2018-07-05 DIAGNOSIS — Z1231 Encounter for screening mammogram for malignant neoplasm of breast: Secondary | ICD-10-CM | POA: Diagnosis not present

## 2018-10-02 DIAGNOSIS — M545 Low back pain: Secondary | ICD-10-CM | POA: Diagnosis not present

## 2018-11-13 DIAGNOSIS — M7672 Peroneal tendinitis, left leg: Secondary | ICD-10-CM | POA: Diagnosis not present

## 2018-11-22 DIAGNOSIS — H2513 Age-related nuclear cataract, bilateral: Secondary | ICD-10-CM | POA: Diagnosis not present

## 2018-11-22 DIAGNOSIS — H401131 Primary open-angle glaucoma, bilateral, mild stage: Secondary | ICD-10-CM | POA: Diagnosis not present

## 2018-11-22 DIAGNOSIS — H25013 Cortical age-related cataract, bilateral: Secondary | ICD-10-CM | POA: Diagnosis not present

## 2018-11-28 DIAGNOSIS — M7672 Peroneal tendinitis, left leg: Secondary | ICD-10-CM | POA: Diagnosis not present

## 2018-12-07 DIAGNOSIS — I10 Essential (primary) hypertension: Secondary | ICD-10-CM | POA: Diagnosis not present

## 2018-12-07 DIAGNOSIS — J309 Allergic rhinitis, unspecified: Secondary | ICD-10-CM | POA: Diagnosis not present

## 2019-02-22 DIAGNOSIS — Z008 Encounter for other general examination: Secondary | ICD-10-CM | POA: Diagnosis not present

## 2019-04-01 DIAGNOSIS — M7672 Peroneal tendinitis, left leg: Secondary | ICD-10-CM | POA: Diagnosis not present

## 2019-04-01 DIAGNOSIS — M65871 Other synovitis and tenosynovitis, right ankle and foot: Secondary | ICD-10-CM | POA: Diagnosis not present

## 2019-04-09 DIAGNOSIS — M79672 Pain in left foot: Secondary | ICD-10-CM | POA: Diagnosis not present

## 2019-04-09 DIAGNOSIS — M79671 Pain in right foot: Secondary | ICD-10-CM | POA: Diagnosis not present

## 2019-04-29 DIAGNOSIS — M65871 Other synovitis and tenosynovitis, right ankle and foot: Secondary | ICD-10-CM | POA: Diagnosis not present

## 2019-04-29 DIAGNOSIS — M7672 Peroneal tendinitis, left leg: Secondary | ICD-10-CM | POA: Diagnosis not present

## 2019-05-06 DIAGNOSIS — H401131 Primary open-angle glaucoma, bilateral, mild stage: Secondary | ICD-10-CM | POA: Diagnosis not present

## 2019-05-15 DIAGNOSIS — M79672 Pain in left foot: Secondary | ICD-10-CM | POA: Diagnosis not present

## 2019-05-15 DIAGNOSIS — M542 Cervicalgia: Secondary | ICD-10-CM | POA: Diagnosis not present

## 2019-05-15 DIAGNOSIS — M79671 Pain in right foot: Secondary | ICD-10-CM | POA: Diagnosis not present

## 2019-05-17 ENCOUNTER — Emergency Department (HOSPITAL_BASED_OUTPATIENT_CLINIC_OR_DEPARTMENT_OTHER)
Admission: EM | Admit: 2019-05-17 | Discharge: 2019-05-18 | Disposition: A | Discharge status: Home / Self Care | Payer: Medicare HMO | Source: Home / Self Care | Attending: Emergency Medicine | Admitting: Emergency Medicine

## 2019-05-17 ENCOUNTER — Emergency Department (HOSPITAL_COMMUNITY)
Admission: EM | Admit: 2019-05-17 | Discharge: 2019-05-17 | Disposition: A | Discharge status: Home / Self Care | Payer: Medicare HMO | Attending: Emergency Medicine | Admitting: Emergency Medicine

## 2019-05-17 ENCOUNTER — Encounter (HOSPITAL_COMMUNITY): Payer: Self-pay | Admitting: Emergency Medicine

## 2019-05-17 ENCOUNTER — Emergency Department (HOSPITAL_BASED_OUTPATIENT_CLINIC_OR_DEPARTMENT_OTHER): Payer: Medicare HMO

## 2019-05-17 ENCOUNTER — Other Ambulatory Visit: Payer: Self-pay

## 2019-05-17 ENCOUNTER — Encounter (HOSPITAL_BASED_OUTPATIENT_CLINIC_OR_DEPARTMENT_OTHER): Payer: Self-pay

## 2019-05-17 DIAGNOSIS — I10 Essential (primary) hypertension: Secondary | ICD-10-CM

## 2019-05-17 DIAGNOSIS — Z79899 Other long term (current) drug therapy: Secondary | ICD-10-CM | POA: Insufficient documentation

## 2019-05-17 DIAGNOSIS — R202 Paresthesia of skin: Secondary | ICD-10-CM | POA: Insufficient documentation

## 2019-05-17 DIAGNOSIS — M79601 Pain in right arm: Secondary | ICD-10-CM | POA: Insufficient documentation

## 2019-05-17 DIAGNOSIS — F17210 Nicotine dependence, cigarettes, uncomplicated: Secondary | ICD-10-CM | POA: Insufficient documentation

## 2019-05-17 DIAGNOSIS — M542 Cervicalgia: Secondary | ICD-10-CM | POA: Insufficient documentation

## 2019-05-17 DIAGNOSIS — M5030 Other cervical disc degeneration, unspecified cervical region: Secondary | ICD-10-CM | POA: Insufficient documentation

## 2019-05-17 DIAGNOSIS — M25511 Pain in right shoulder: Secondary | ICD-10-CM | POA: Diagnosis not present

## 2019-05-17 DIAGNOSIS — R778 Other specified abnormalities of plasma proteins: Secondary | ICD-10-CM

## 2019-05-17 DIAGNOSIS — Z5321 Procedure and treatment not carried out due to patient leaving prior to being seen by health care provider: Secondary | ICD-10-CM | POA: Insufficient documentation

## 2019-05-17 DIAGNOSIS — I700 Atherosclerosis of aorta: Secondary | ICD-10-CM | POA: Diagnosis not present

## 2019-05-17 DIAGNOSIS — R7989 Other specified abnormal findings of blood chemistry: Secondary | ICD-10-CM | POA: Insufficient documentation

## 2019-05-17 DIAGNOSIS — R079 Chest pain, unspecified: Secondary | ICD-10-CM | POA: Diagnosis not present

## 2019-05-17 LAB — CBC
HCT: 40.6 % (ref 36.0–46.0)
Hemoglobin: 13 g/dL (ref 12.0–15.0)
MCH: 31.3 pg (ref 26.0–34.0)
MCHC: 32 g/dL (ref 30.0–36.0)
MCV: 97.6 fL (ref 80.0–100.0)
Platelets: 240 10*3/uL (ref 150–400)
RBC: 4.16 MIL/uL (ref 3.87–5.11)
RDW: 13.3 % (ref 11.5–15.5)
WBC: 8 10*3/uL (ref 4.0–10.5)
nRBC: 0 % (ref 0.0–0.2)

## 2019-05-17 LAB — BASIC METABOLIC PANEL
Anion gap: 12 (ref 5–15)
BUN: 27 mg/dL — ABNORMAL HIGH (ref 8–23)
CO2: 24 mmol/L (ref 22–32)
Calcium: 9.9 mg/dL (ref 8.9–10.3)
Chloride: 104 mmol/L (ref 98–111)
Creatinine, Ser: 1.02 mg/dL — ABNORMAL HIGH (ref 0.44–1.00)
GFR calc Af Amer: >60 mL/min (ref >60)
GFR calc non Af Amer: 55 mL/min — ABNORMAL LOW (ref >60)
Glucose, Bld: 161 mg/dL — ABNORMAL HIGH (ref 70–99)
Potassium: 3.5 mmol/L (ref 3.5–5.1)
Sodium: 140 mmol/L (ref 135–145)

## 2019-05-17 LAB — CBC WITH DIFFERENTIAL/PLATELET
Abs Immature Granulocytes: 0.05 10*3/uL (ref 0.00–0.07)
Basophils Absolute: 0 10*3/uL (ref 0.0–0.1)
Basophils Relative: 0 %
Eosinophils Absolute: 0 10*3/uL (ref 0.0–0.5)
Eosinophils Relative: 0 %
HCT: 43.2 % (ref 36.0–46.0)
Hemoglobin: 14.1 g/dL (ref 12.0–15.0)
Immature Granulocytes: 1 %
Lymphocytes Relative: 12 %
Lymphs Abs: 1.3 10*3/uL (ref 0.7–4.0)
MCH: 31.2 pg (ref 26.0–34.0)
MCHC: 32.6 g/dL (ref 30.0–36.0)
MCV: 95.6 fL (ref 80.0–100.0)
Monocytes Absolute: 0.3 10*3/uL (ref 0.1–1.0)
Monocytes Relative: 3 %
Neutro Abs: 9.1 10*3/uL — ABNORMAL HIGH (ref 1.7–7.7)
Neutrophils Relative %: 84 %
Platelets: 244 10*3/uL (ref 150–400)
RBC: 4.52 MIL/uL (ref 3.87–5.11)
RDW: 13.3 % (ref 11.5–15.5)
WBC: 10.7 10*3/uL — ABNORMAL HIGH (ref 4.0–10.5)
nRBC: 0 % (ref 0.0–0.2)

## 2019-05-17 LAB — COMPREHENSIVE METABOLIC PANEL
ALT: 18 U/L (ref 0–44)
AST: 22 U/L (ref 15–41)
Albumin: 4.7 g/dL (ref 3.5–5.0)
Alkaline Phosphatase: 50 U/L (ref 38–126)
Anion gap: 14 (ref 5–15)
BUN: 25 mg/dL — ABNORMAL HIGH (ref 8–23)
CO2: 22 mmol/L (ref 22–32)
Calcium: 10.3 mg/dL (ref 8.9–10.3)
Chloride: 102 mmol/L (ref 98–111)
Creatinine, Ser: 0.92 mg/dL (ref 0.44–1.00)
GFR calc Af Amer: >60 mL/min (ref >60)
GFR calc non Af Amer: >60 mL/min (ref >60)
Glucose, Bld: 128 mg/dL — ABNORMAL HIGH (ref 70–99)
Potassium: 4 mmol/L (ref 3.5–5.1)
Sodium: 138 mmol/L (ref 135–145)
Total Bilirubin: 0.7 mg/dL (ref 0.3–1.2)
Total Protein: 8 g/dL (ref 6.5–8.1)

## 2019-05-17 LAB — TROPONIN I (HIGH SENSITIVITY): Troponin I (High Sensitivity): 58 ng/L — ABNORMAL HIGH (ref <18)

## 2019-05-17 MED ORDER — VALSARTAN-HYDROCHLOROTHIAZIDE 320-12.5 MG PO TABS: 1.0000 | ORAL_TABLET | Freq: Every day | ORAL | 0 refills | Status: DC

## 2019-05-17 MED ORDER — CYCLOBENZAPRINE HCL 5 MG PO TABS: 5.0000 mg | ORAL_TABLET | Freq: Three times a day (TID) | ORAL | 0 refills | Status: DC | PRN

## 2019-05-17 MED ORDER — IOHEXOL 350 MG/ML SOLN
100.0000 mL | Freq: Once | INTRAVENOUS | Status: AC | PRN
  Administered 2019-05-17: 100 mL via INTRAVENOUS

## 2019-05-17 MED ORDER — KETOROLAC TROMETHAMINE 30 MG/ML IJ SOLN
15.0000 mg | Freq: Once | INTRAMUSCULAR | Status: AC
  Administered 2019-05-17: 15 mg via INTRAVENOUS
  Filled 2019-05-17: qty 1

## 2019-05-17 MED ORDER — CYCLOBENZAPRINE HCL 5 MG PO TABS
5.0000 mg | ORAL_TABLET | Freq: Once | ORAL | Status: AC
  Administered 2019-05-17: 22:00:00 5 mg via ORAL
  Filled 2019-05-17: qty 1

## 2019-05-17 MED ORDER — MORPHINE SULFATE (PF) 4 MG/ML IV SOLN
4.0000 mg | Freq: Once | INTRAVENOUS | Status: DC
  Filled 2019-05-17: qty 1

## 2019-05-17 MED ORDER — ASPIRIN 325 MG PO TABS
325.0000 mg | ORAL_TABLET | Freq: Once | ORAL | Status: AC
  Administered 2019-05-17: 325 mg via ORAL
  Filled 2019-05-17: qty 1

## 2019-05-17 MED ORDER — METHYLPREDNISOLONE SODIUM SUCC 125 MG IJ SOLR
125.0000 mg | Freq: Once | INTRAMUSCULAR | Status: AC
  Administered 2019-05-17: 22:00:00 125 mg via INTRAVENOUS
  Filled 2019-05-17: qty 2

## 2019-05-17 NOTE — ED Notes (Signed)
Pt decided to wait in car with daughter pt is willing to be seen but she said it was very cold in here

## 2019-05-17 NOTE — ED Triage Notes (Signed)
Pt. Stated, Donnald Garre had some pain in the back of my head and neck . Then I will have this loss of feeling off and on in both arms and hands. Ive had this going on for 10 years.

## 2019-05-17 NOTE — ED Notes (Signed)
Patient transported to CT 

## 2019-05-17 NOTE — ED Triage Notes (Addendum)
Pt c/o intermittent posterior neck pain that radiates down right arm "then it goes all over and hurts to lift both arms" x "years"-pain worse x 2 days-NAD-steady gait-pt was at [facility] ED left and went to [facility] then here

## 2019-05-17 NOTE — ED Notes (Signed)
No answer for vitals recheck x1 

## 2019-05-17 NOTE — ED Provider Notes (Addendum)
[facility] EMERGENCY DEPARTMENT Provider Note   CSN: [id number] Arrival date & time: 05/17/19  2106     History   Chief Complaint Chief Complaint  Patient presents with  . Neck Pain    HPI Tonya [name] is a 73 y.o. female history of hypertension, hyperlipidemia here presenting with right-sided neck pain .She has been having right-sided neck pain for the last several years. She states that usually is worse after movement.  The last 2 to 3 days she states the pain is worse and radiates down the right arm.  She states that it is a shooting pain but denies any numbness or weakness or trouble grabbing things in her right arm. She denies any chest pain or shortness of breath .  She went to [facility] earlier today but left without being seen.  She had normal kidney function at that time.     The history is provided by the patient.    Past Medical History:  Diagnosis Date  . Allergy   . Hyperlipidemia   . Hypertension   . [name] disease    per her report from skin biopsy prior  . Osteoarthritis    knee, hips  . Plantar fasciitis    right foot  . Tobacco use disorder     Patient Active Problem List   Diagnosis Date Noted  . Physical exam 07/02/2015  . Eustachian tube dysfunction 05/29/2015  . Hyperlipidemia 06/04/2013  . PVCs (premature ventricular contractions) 06/04/2013  . Snoring 11/08/2011  . Sleep disturbance 11/08/2011  . Need for prophylactic vaccination and inoculation against influenza 11/08/2011  . Essential hypertension, benign 07/13/2011  . Tobacco use disorder 07/13/2011    Past Surgical History:  Procedure Laterality Date  . ABDOMINAL HYSTERECTOMY     partial  . CHOLECYSTECTOMY  2003  . COLONOSCOPY  2010  . WISDOM TOOTH EXTRACTION       OB History   No obstetric history on file.      Home Medications    Prior to Admission medications   Medication Sig Start Date End Date Taking? Authorizing Provider  buPROPion (WELLBUTRIN  XL) 150 MG 24 hr tablet TAKE 1 TABLET (150 MG TOTAL) BY MOUTH DAILY. 09/06/16   Midge Minium, MD  cetirizine (ZYRTEC) 10 MG tablet Take 1 tablet (10 mg total) by mouth daily. 05/29/15   Midge Minium, MD  cycloSPORINE (RESTASIS) 0.05 % ophthalmic emulsion 1 drop 2 (two) times daily.    [provider]  fluticasone (FLONASE) 50 MCG/ACT nasal spray Place 2 sprays into both nostrils daily. 05/29/15   Midge Minium, MD  meloxicam (MOBIC) 15 MG tablet TAKE 1 TABLET BY MOUTH ONCE DAILY WITH FOOD 06/22/16   Midge Minium, MD  Probiotic CAPS Take 1 capsule by mouth every other day.    [provider]  valsartan-hydrochlorothiazide (DIOVAN-HCT) 320-12.5 MG per tablet TAKE 1 TABLET BY MOUTH DAILY. 05/25/15   Midge Minium, MD    Family History Family History  Problem Relation Age of Onset  . Hypertension Mother   . Stroke Mother        multiple  . Heart disease Father        reportedly died of MI  . Pneumonia Brother        died of pneumonia  . Other Brother        died in surgery, reportedly due to lung disease  . Liver disease Brother        died  of NASH    Social History Social History   Tobacco Use  . Smoking status: Current Every Day Smoker    Packs/day: 1.00    Years: 40.00    Pack years: 40.00    Types: Cigarettes  . Smokeless tobacco: Never Used  Substance Use Topics  . Alcohol use: Yes    Alcohol/week: 0.0 standard drinks    Comment: occ  . Drug use: No     Allergies   Penicillins   Review of Systems Review of Systems  Musculoskeletal: Positive for neck pain.  All other systems reviewed and are negative.    Physical Exam Updated Vital Signs BP (!) 188/88 (BP Location: Left Arm)   Pulse 88   Temp 98 F (36.7 C) (Oral)   Resp 20   SpO2 100%   Physical Exam Vitals signs and nursing note reviewed.  Constitutional:      Appearance: Normal appearance.  HENT:     Head: Normocephalic.     Nose: Nose normal.      Mouth/Throat:     Mouth: Mucous membranes are moist.  Eyes:     Extraocular Movements: Extraocular movements intact.     Pupils: Pupils are equal, round, and reactive to light.  Neck:     Musculoskeletal: Normal range of motion.     Comments: No midline tenderness, + R paracervical tenderness, nl ROM  Cardiovascular:     Rate and Rhythm: Normal rate and regular rhythm.  Pulmonary:     Effort: Pulmonary effort is normal.     Breath sounds: Normal breath sounds.  Abdominal:     General: Abdomen is flat.     Palpations: Abdomen is soft.  Musculoskeletal: Normal range of motion.  Skin:    General: Skin is warm.  Neurological:     General: No focal deficit present.     Mental Status: She is alert and oriented to person, place, and time.     Comments: CN 2- 12 intact, nl strength and sensation. Nl finger to nose bilaterally.   Psychiatric:        Mood and Affect: Mood normal.      ED Treatments / Results  Labs (all labs ordered are listed, but only abnormal results are displayed) Labs Reviewed  CBC WITH DIFFERENTIAL/PLATELET - Abnormal; Notable for the following components:      Result Value   WBC 10.7 (*)    Neutro Abs 9.1 (*)    All other components within normal limits  COMPREHENSIVE METABOLIC PANEL - Abnormal; Notable for the following components:   Glucose, Bld 128 (*)    BUN 25 (*)    All other components within normal limits  TROPONIN I (HIGH SENSITIVITY)    EKG EKG Interpretation  Date/Time:  Friday May 17 2019 22:05:27 EDT Ventricular Rate:  76 PR Interval:    QRS Duration: 139 QT Interval:  434 QTC Calculation: 488 R Axis:   -54 Text Interpretation:  Sinus rhythm RBBB and LAFB Probable left ventricular hypertrophy ST elevation, consider inferior injury Baseline wander in lead(s) V3 V5 No previous ECGs available Confirmed by Wandra Arthurs [phone]) on 05/17/2019 10:21:44 PM   Radiology Ct Cervical Spine Wo Contrast  Result Date: 05/17/2019 CLINICAL DATA:   73 year old female with posterior neck pain radiating down the arm. EXAM: CT CERVICAL SPINE WITHOUT CONTRAST TECHNIQUE: Multidetector CT imaging of the cervical spine was performed without intravenous contrast. Multiplanar CT image reconstructions were also generated. COMPARISON:  None. FINDINGS: Alignment: No acute  subluxation. There is straightening of normal cervical lordosis which may be positional or due to muscle spasm Skull base and vertebrae: No acute fracture Soft tissues and spinal canal: No prevertebral fluid or swelling. No visible canal hematoma. Disc levels:  No acute findings. Degenerative changes. Upper chest: Biapical subpleural thickening/scarring. Other: Small calcific focus in the left thyroid gland. Right carotid bulb calcified plaques. IMPRESSION: No acute/traumatic cervical spine pathology. Electronically Signed   By: Anner Crete M.D.   On: 05/17/2019 22:33    Procedures Procedures (including critical care time)  Medications Ordered in ED Medications  methylPREDNISolone sodium succinate (SOLU-MEDROL) 125 mg/2 mL injection 125 mg (125 mg Intravenous Given 05/17/19 2209)  cyclobenzaprine (FLEXERIL) tablet 5 mg (5 mg Oral Given 05/17/19 2210)  ketorolac (TORADOL) 30 MG/ML injection 15 mg (15 mg Intravenous Given 05/17/19 2209)     Initial Impression / Assessment and Plan / ED Course  I have reviewed the triage vital signs and the nursing notes.  Pertinent labs & imaging results that were available during my care of the patient were reviewed by me and considered in my medical decision making (see chart for details).       MIIA BLANKS is a 73 y.o. female she presented with neck pain with paresthesias down the right arm.  She has no numbness or weakness and has normal neuro exam on bilateral upper and lower extremities .  Suspect some radiculopathy.  Will get CT cervical spine, labs. Will give flexeril, NSAIDs, steroids.   10:58 PM Trop is 58. BP down to 150s. I  wonder if she has demand ischemia from hypertension. CT cervical spine negative.  We will add on CTA dissection study since she has some back pain. We will also do CT neck.  Given aspirin as well.  Repeat troponin at midnight.  If it is stable, anticipate discharge home with Flexeril, baby aspirin, cardiology follow up. Signed out to Dr. Florina Ou in the ED.   Final Clinical Impressions(s) / ED Diagnoses   Final diagnoses:  None    ED Discharge Orders    None       Drenda Freeze, MD 05/17/19 2300    Drenda Freeze, MD 05/17/19 [phone]

## 2019-05-17 NOTE — Discharge Instructions (Addendum)
Take ASA 81 mg daily   Take your blood pressure meds as prescribed   Take flexeril for muscle spasms.   Follow up with cardiology and neurosurgery   Return to ER if you have worse neck pain, numbness, weakness

## 2019-05-18 LAB — TROPONIN I (HIGH SENSITIVITY): Troponin I (High Sensitivity): 48 ng/L — ABNORMAL HIGH

## 2019-05-18 NOTE — ED Provider Notes (Signed)
Nursing notes and vitals signs, including pulse oximetry, reviewed.  Summary of this visit's results, reviewed by myself:  EKG:  EKG Interpretation  Date/Time:  Friday May 17 2019 22:05:27 EDT Ventricular Rate:  76 PR Interval:    QRS Duration: 139 QT Interval:  434 QTC Calculation: 488 R Axis:   -54 Text Interpretation:  Sinus rhythm RBBB and LAFB Probable left ventricular hypertrophy ST elevation, consider inferior injury Baseline wander in lead(s) V3 V5 No previous ECGs available Confirmed by Wandra Arthurs [phone]) on 05/17/2019 10:21:44 PM       Labs:  Results for orders placed or performed during the hospital encounter of 05/17/19 (from the past 24 hour(s))  CBC with Differential/Platelet     Status: Abnormal   Collection Time: 05/17/19 10:05 PM  Result Value Ref Range   WBC 10.7 (H) 4.0 - 10.5 K/uL   RBC 4.52 3.87 - 5.11 MIL/uL   Hemoglobin 14.1 12.0 - 15.0 g/dL   HCT 43.2 36.0 - 46.0 %   MCV 95.6 80.0 - 100.0 fL   MCH 31.2 26.0 - 34.0 pg   MCHC 32.6 30.0 - 36.0 g/dL   RDW 13.3 11.5 - 15.5 %   Platelets 244 150 - 400 K/uL   nRBC 0.0 0.0 - 0.2 %   Neutrophils Relative % 84 %   Neutro Abs 9.1 (H) 1.7 - 7.7 K/uL   Lymphocytes Relative 12 %   Lymphs Abs 1.3 0.7 - 4.0 K/uL   Monocytes Relative 3 %   Monocytes Absolute 0.3 0.1 - 1.0 K/uL   Eosinophils Relative 0 %   Eosinophils Absolute 0.0 0.0 - 0.5 K/uL   Basophils Relative 0 %   Basophils Absolute 0.0 0.0 - 0.1 K/uL   Immature Granulocytes 1 %   Abs Immature Granulocytes 0.05 0.00 - 0.07 K/uL  Comprehensive metabolic panel     Status: Abnormal   Collection Time: 05/17/19 10:05 PM  Result Value Ref Range   Sodium 138 135 - 145 mmol/L   Potassium 4.0 3.5 - 5.1 mmol/L   Chloride 102 98 - 111 mmol/L   CO2 22 22 - 32 mmol/L   Glucose, Bld 128 (H) 70 - 99 mg/dL   BUN 25 (H) 8 - 23 mg/dL   Creatinine, Ser 0.92 0.44 - 1.00 mg/dL   Calcium 10.3 8.9 - 10.3 mg/dL   Total Protein 8.0 6.5 - 8.1 g/dL   Albumin 4.7 3.5 -  5.0 g/dL   AST 22 15 - 41 U/L   ALT 18 0 - 44 U/L   Alkaline Phosphatase 50 38 - 126 U/L   Total Bilirubin 0.7 0.3 - 1.2 mg/dL   GFR calc non Af Amer >60 >60 mL/min   GFR calc Af Amer >60 >60 mL/min   Anion gap 14 5 - 15  Troponin I (High Sensitivity)     Status: Abnormal   Collection Time: 05/17/19 10:05 PM  Result Value Ref Range   Troponin I (High Sensitivity) 58 (H) <18 ng/L  Troponin I (High Sensitivity)     Status: Abnormal   Collection Time: 05/18/19 12:01 AM  Result Value Ref Range   Troponin I (High Sensitivity) 48 (H) <18 ng/L    Imaging Studies: Dg Chest 2 View  Result Date: 05/17/2019 CLINICAL DATA:  Chest pain EXAM: CHEST - 2 VIEW COMPARISON:  03/30/2012 FINDINGS: No focal opacity or pleural effusion. Borderline cardiomegaly. No pneumothorax. Aortic atherosclerosis IMPRESSION: No active cardiopulmonary disease. Electronically Signed   By: Maudie Mercury  Francoise Ceo M.D.   On: 05/17/2019 22:57   Ct Soft Tissue Neck W Contrast  Result Date: 05/17/2019 CLINICAL DATA:  Neck pain that radiates into the right arm. EXAM: CT NECK WITH CONTRAST TECHNIQUE: Multidetector CT imaging of the neck was performed using the standard protocol following the bolus administration of intravenous contrast. CONTRAST:  123mL OMNIPAQUE IOHEXOL 350 MG/ML SOLN COMPARISON:  None. FINDINGS: PHARYNX AND LARYNX: --Nasopharynx: Fossae of Rosenmuller are clear. Normal adenoid tonsils for age. --Oral cavity and oropharynx: The palatine and lingual tonsils are normal. The visible oral cavity and floor of mouth are normal. --Hypopharynx: Normal vallecula and pyriform sinuses. --Larynx: Normal epiglottis and pre-epiglottic space. Normal aryepiglottic and vocal folds. --Retropharyngeal space: No abscess, effusion or lymphadenopathy. SALIVARY GLANDS: --Parotid: No mass lesion or inflammation. No sialolithiasis or ductal dilatation. --Submandibular: Prominent bilateral intraglandular ducts. No sialolithiasis. --Sublingual: Normal.  No ranula or other visible lesion of the base of tongue and floor of mouth. THYROID: Normal. LYMPH NODES: No enlarged or abnormal density lymph nodes. VASCULAR: Major cervical vessels are patent. LIMITED INTRACRANIAL: Normal. VISUALIZED ORBITS: Normal. MASTOIDS AND VISUALIZED PARANASAL SINUSES: No fluid levels or advanced mucosal thickening. No mastoid effusion. SKELETON: There is no bony spinal canal stenosis. Degenerative disc disease is greatest at C5-7. There is bilateral neural foraminal stenosis at both levels, greatest at right C5-6. UPPER CHEST: There is scarring at the left lung apex. OTHER: None. IMPRESSION: 1. No acute soft tissue abnormality of the neck. 2. Lower cervical degenerative disc disease with moderate-to-severe neural foraminal stenosis bilaterally at C5-6 and C6-7. Electronically Signed   By: Ulyses Jarred M.D.   On: 05/17/2019 23:38   Ct Cervical Spine Wo Contrast  Result Date: 05/17/2019 CLINICAL DATA:  73 year old female with posterior neck pain radiating down the arm. EXAM: CT CERVICAL SPINE WITHOUT CONTRAST TECHNIQUE: Multidetector CT imaging of the cervical spine was performed without intravenous contrast. Multiplanar CT image reconstructions were also generated. COMPARISON:  None. FINDINGS: Alignment: No acute subluxation. There is straightening of normal cervical lordosis which may be positional or due to muscle spasm Skull base and vertebrae: No acute fracture Soft tissues and spinal canal: No prevertebral fluid or swelling. No visible canal hematoma. Disc levels:  No acute findings. Degenerative changes. Upper chest: Biapical subpleural thickening/scarring. Other: Small calcific focus in the left thyroid gland. Right carotid bulb calcified plaques. IMPRESSION: No acute/traumatic cervical spine pathology. Electronically Signed   By: Anner Crete M.D.   On: 05/17/2019 22:33   Ct Angio Chest/abd/pel For Dissection W And/or Wo Contrast  Result Date: 05/18/2019 CLINICAL DATA:   Neck pain radiating to the arms EXAM: CT ANGIOGRAPHY CHEST, ABDOMEN AND PELVIS TECHNIQUE: Multidetector CT imaging through the chest, abdomen and pelvis was performed using the standard protocol during bolus administration of intravenous contrast. Multiplanar reconstructed images and MIPs were obtained and reviewed to evaluate the vascular anatomy. CONTRAST:  123mL OMNIPAQUE IOHEXOL 350 MG/ML SOLN COMPARISON:  None. FINDINGS: CTA CHEST FINDINGS Cardiovascular: --Heart: The heart size is normal. There is nopericardial effusion. There are coronary artery calcifications. --Aorta: The course and caliber of the thoracic aorta are normal. There is mild aortic atherosclerotic calcification. Precontrast images show no aortic intramural hematoma. There is no blood pool, dissection or penetrating ulcer demonstrated on arterial phase postcontrast imaging. Normal variant aortic arch branching pattern with the brachiocephalic and left common carotid arteries sharing a common origin. The proximal arch vessels are widely patent. --Pulmonary Arteries: Contrast timing is optimized for preferential opacification of the aorta. Within  that limitation, normal central pulmonary arteries. Mediastinum/Nodes: No mediastinal, hilar or axillary lymphadenopathy. The visualized thyroid and thoracic esophageal course are unremarkable. Lungs/Pleura: No pleural effusion, mass or nodule. Mild scarring at the left apex. Musculoskeletal: No chest wall abnormality. No acute osseous findings. Review of the MIP images confirms the above findings. CTA ABDOMEN AND PELVIS FINDINGS VASCULAR Aorta: Normal caliber aorta without aneurysm, dissection, vasculitis or hemodynamically significant stenosis. There is mild aortic atherosclerosis. Celiac: No aneurysm, dissection or hemodynamically significant stenosis. Normal branching pattern. SMA: Widely patent without dissection or stenosis. Renals: Single renal arteries bilaterally. No aneurysm, dissection,  stenosis or evidence of fibromuscular dysplasia. IMA: Patent without abnormality. Inflow: No aneurysm, stenosis or dissection. Veins: Normal course and caliber of the major veins. Assessment is otherwise limited by the arterial dominant contrast phase. Review of the MIP images confirms the above findings. NON-VASCULAR Hepatobiliary: Normal hepatic contours and density. No visible biliary dilatation. Status post cholecystectomy. Pancreas: Normal contours without ductal dilatation. No peripancreatic fluid collection. Spleen: Normal arterial phase splenic enhancement pattern. Adrenals/Urinary Tract: --Adrenal glands: Normal. --Right kidney/ureter: No hydronephrosis or perinephric stranding. No nephrolithiasis. No obstructing ureteral stones. --Left kidney/ureter: No hydronephrosis or perinephric stranding. No nephrolithiasis. No obstructing ureteral stones. --Urinary bladder: Unremarkable. Stomach/Bowel: --Stomach/Duodenum: No hiatal hernia or other gastric abnormality. Normal duodenal course and caliber. --Small bowel: No dilatation or inflammation. --Colon: No focal abnormality. --Appendix: Normal. Lymphatic:  No abdominal or pelvic lymphadenopathy. Reproductive: Status post hysterectomy. No adnexal mass. Musculoskeletal. No bony spinal canal stenosis or focal osseous abnormality. Other: None. Review of the MIP images confirms the above findings. IMPRESSION: 1. No acute aortic syndrome. 2. No acute abnormality of the chest, abdomen or pelvis. 3.  Aortic atherosclerosis (ICD10-I70.0). Electronically Signed   By: Ulyses Jarred M.D.   On: 05/18/2019 00:17      Kevis Qu, Jenny Reichmann, MD 05/18/19 [phone]

## 2019-05-27 DIAGNOSIS — M7672 Peroneal tendinitis, left leg: Secondary | ICD-10-CM | POA: Diagnosis not present

## 2019-05-27 DIAGNOSIS — M65871 Other synovitis and tenosynovitis, right ankle and foot: Secondary | ICD-10-CM | POA: Diagnosis not present

## 2019-05-28 DIAGNOSIS — M542 Cervicalgia: Secondary | ICD-10-CM | POA: Diagnosis not present

## 2019-06-04 NOTE — Progress Notes (Signed)
Cardiology Office Note:   Date:  06/05/2019  NAME:  Tonya [name]    MRN: [id number] DOB:  Feb 04, 1946   PCP:  Alroy Dust, L.Marlou Sa, MD  Cardiologist:  Evalina Field, MD  Electrophysiologist:  None   Referring MD: No ref. provider found   Chief Complaint  Patient presents with  . elevated troponin   History of Present Illness:   Tonya [name] is a 73 y.o. female with a hx of hypertension, hyperlipidemia who is being seen today for the evaluation of neck pain at the request of Alroy Dust, L.Marlou Sa, MD. she presents for follow-up after being in the hospital for acute neck pain.  She was seen in the emergency room at [facility] on August 14 for a bout of what appears to be neuropathic pain due to degenerative disease in her cervical spine.  She was noted to have a mildly elevated high-sensitivity troponin, but ruled out for myocardial infarction.  Her EKG at that time demonstrated normal sinus rhythm with a right bundle branch block and left anterior fascicular block.  There was no evidence of high-grade conduction disease, or concern for need for pacemaker at that time.  She reports that day she had intense neck pain started in her neck and the pain radiated into her arms.  She reports that she has had this pain for nearly 8 years and has been told she may need neck surgery.  She reports the pain was so intense that she had to present to the emergency room.  She reports she can get this pain at times, but does resolve on its own.  She reports no chest pressure or shortness of breath with activity, but she is not very active.  She reports she can also have leg pain.  She is a current everyday smoker and has done so for nearly 50 years when I can tell.  She smokes about 1 pack a day.  She states she is trying to quit.  Other cardiovascular disease risk factors include high blood pressure which is well controlled today.  Her most recent lipid profile shows an LDL of 123.  Past Medical History: Past  Medical History:  Diagnosis Date  . Allergy   . Hyperlipidemia   . Hypertension   . [name] disease    per her report from skin biopsy prior  . Osteoarthritis    knee, hips  . Plantar fasciitis    right foot  . Tobacco use disorder     Past Surgical History: Past Surgical History:  Procedure Laterality Date  . ABDOMINAL HYSTERECTOMY     partial  . CHOLECYSTECTOMY  2003  . COLONOSCOPY  2010  . WISDOM TOOTH EXTRACTION      Current Medications: Current Meds  Medication Sig  . cetirizine (ZYRTEC) 10 MG tablet Take 1 tablet (10 mg total) by mouth daily.  . cyclobenzaprine (FLEXERIL) 5 MG tablet Take 1 tablet (5 mg total) by mouth 3 (three) times daily as needed for muscle spasms.  . cycloSPORINE (RESTASIS) 0.05 % ophthalmic emulsion 1 drop 2 (two) times daily.  . fluticasone (FLONASE) 50 MCG/ACT nasal spray Place 2 sprays into both nostrils daily.  . Multiple Vitamins-Minerals (VITAMIN D3 COMPLETE PO) Take by mouth. Taking 2-3 tablets daily  . Probiotic CAPS Take 1 capsule by mouth every other day.  . valsartan-hydrochlorothiazide (DIOVAN-HCT) 320-12.5 MG tablet Take 1 tablet by mouth daily.     Allergies:    Penicillins   Social History: Social History  Socioeconomic History  . Marital status: Single    Spouse name: Not on file  . Number of children: Not on file  . Years of education: Not on file  . Highest education level: Not on file  Occupational History  . Not on file  Social Needs  . Financial resource strain: Not on file  . Food insecurity    Worry: Not on file    Inability: Not on file  . Transportation needs    Medical: Not on file    Non-medical: Not on file  Tobacco Use  . Smoking status: Current Every Day Smoker    Packs/day: 1.00    Years: 50.00    Pack years: 50.00    Types: Cigarettes  . Smokeless tobacco: Never Used  Substance and Sexual Activity  . Alcohol use: Not Currently    Alcohol/week: 0.0 standard drinks    Comment: occ   . Drug use: No  . Sexual activity: Never  Lifestyle  . Physical activity    Days per week: Not on file    Minutes per session: Not on file  . Stress: Not on file  Relationships  . Social Herbalist on phone: Not on file    Gets together: Not on file    Attends religious service: Not on file    Active member of club or organization: Not on file    Attends meetings of clubs or organizations: Not on file    Relationship status: Not on file  Other Topics Concern  . Not on file  Social History Narrative  . Not on file     Family History: The patient's family history includes Heart disease in her father; Hypertension in her mother; Liver disease in her brother; Other in her brother; Pneumonia in her brother; Stroke in her mother.  ROS:   All other ROS reviewed and negative. Pertinent positives noted in the HPI.     EKGs/Labs/Other Studies Reviewed:   The following studies were personally reviewed by me today:  CT head/neck reviewed: I did review the CT scan of her head and neck, which did include some of her heart, there appears to be minimal to mild calcifications, she will be a good candidate for a coronary CTA  EKG:  EKG is ordered today.  The ekg ordered today demonstrates normal sinus rhythm, heart rate 79, right bundle branch block plus left anterior fascicular block, no evidence of ischemic ST changes or prior infarction, and was personally reviewed by me.   Recent Labs: 05/17/2019: ALT 18; BUN 25; Creatinine, Ser 0.92; Hemoglobin 14.1; Platelets 244; Potassium 4.0; Sodium 138   Recent Lipid Panel    Component Value Date/Time   CHOL 184 01/06/2016 1111   TRIG 103.0 01/06/2016 1111   HDL 37.30 (L) 01/06/2016 1111   CHOLHDL 5 01/06/2016 1111   VLDL 20.6 01/06/2016 1111   LDLCALC 126 (H) 01/06/2016 1111    Physical Exam:   VS:  BP 112/66   Pulse 79   Ht 5\' 5"  (1.651 m)   Wt 155 lb 6.4 oz (70.5 kg)   BMI 25.86 kg/m    Wt Readings from Last 3 Encounters:   06/05/19 155 lb 6.4 oz (70.5 kg)  05/17/19 157 lb (71.2 kg)  04/19/16 161 lb 2 oz (73.1 kg)    General: Well nourished, well developed, in no acute distress Heart: Atraumatic, normal size  Eyes: PEERLA, EOMI  Neck: Supple, no JVD Endocrine: No thryomegaly Cardiac: Normal S1,  S2; RRR; no murmurs, rubs, or gallops Lungs: Clear to auscultation bilaterally, no wheezing, rhonchi or rales  Abd: Soft, nontender, no hepatomegaly  Ext: No edema, pulses 2+ Musculoskeletal: No deformities, BUE and BLE strength normal and equal Skin: Warm and dry, no rashes   Neuro: Alert and oriented to person, place, time, and situation, CNII-XII grossly intact, no focal deficits  Psych: Normal mood and affect   ASSESSMENT:   NAME@ is a 73 y.o. female who presents for the following: 1. Elevated troponin   2. Nonspecific abnormal electrocardiogram (ECG) (EKG)   3. Right bundle branch block (RBBB) with left anterior fascicular block (LAFB)   4. Tobacco abuse   5. Chest pain, unspecified type     PLAN:   1. Elevated troponin -She presents today for mildly elevated troponin in the setting of intense neck pain.  There were no concerns for an acute coronary syndrome at that time.  Her blood pressure was elevated and this could be the cause of that.  However, it is not normal for her to have this elevation in her troponin.  I think it is best to proceed with a coronary CTA.  She did have mild calcification seen on her CT scan, but we should be able to get a good look. -She will take 100 mg of metoprolol 1 hour prior scan -We will obtain a BMP today to make sure her kidney function is normal for the scan -She will need to be started on aspirin and statin, we will do this at the next visit  2. Nonspecific abnormal electrocardiogram (ECG) (EKG) 3. Right bundle branch block (RBBB) with left anterior fascicular block (LAFB) -Her EKG is not normal today, but no evidence or symptoms of high-grade conduction disease at  this time -We will obtain a resting echocardiogram just to ensure heart structurally normal, hear no murmurs on exam to suggest she has heart disease  4. Tobacco abuse -Counseled on the importance of smoking cessation   Disposition: Return in about 4 months (around 10/05/2019).  Medication Adjustments/Labs and Tests Ordered: Current medicines are reviewed at length with the patient today.  Concerns regarding medicines are outlined above.  Orders Placed This Encounter  Procedures  . CT CORONARY MORPH W/CTA COR W/SCORE W/CA W/CM &/OR WO/CM  . CT CORONARY FRACTIONAL FLOW RESERVE DATA PREP  . CT CORONARY FRACTIONAL FLOW RESERVE FLUID ANALYSIS  . Basic metabolic panel   Meds ordered this encounter  Medications  . metoprolol tartrate (LOPRESSOR) 50 MG tablet    Sig: Take 1 tablet by mouth once for procedure.    Dispense:  2 tablet    Refill:  0    Patient Instructions  Medication Instructions:  Take the Metoprolol 100 mg one time dose for the CTA. If you need a refill on your cardiac medications before your next appointment, please call your pharmacy.   Lab work: BMET today If you have labs (blood work) drawn today and your tests are completely normal, you will receive your results only by: Marland Kitchen MyChart Message (if you have MyChart) OR . A paper copy in the mail If you have any lab test that is abnormal or we need to change your treatment, we will call you to review the results.  Testing/Procedures: Your physician has requested that you have cardiac CT. Cardiac computed tomography (CT) is a painless test that uses an x-ray machine to take clear, detailed pictures of your heart. For further information please visit [facility]ugeFiesta[facility]tn. Please follow instruction sheet  as given.  Follow-Up: At [facility], you and your health needs are our priority.  As part of our continuing mission to provide you with exceptional heart care, we have created designated Provider Care Teams.  These Care  Teams include your primary Cardiologist (physician) and Advanced Practice Providers (APPs -  Physician Assistants and Nurse Practitioners) who all work together to provide you with the care you need, when you need it. You will need a follow up appointment in 4 months.  Please call our office 2 months in advance to schedule this appointment.  You may see Evalina Field, MD or one of the following Advanced Practice Providers on your designated Care Team: [place], [place] . Fabian Sharp, PA-C   Your cardiac CT will be scheduled at one of the below locations:   [facility] [address] [place], [place] [address] [phone]  [place] [address] [place], [place] [address] [phone]  Please arrive at the [facility] main entrance of [facility] 30-45 minutes prior to test start time. Proceed to the [facility] Radiology Department (first floor) to check-in and test prep.  Please follow these instructions carefully (unless otherwise directed):  Hold all erectile dysfunction medications at least 48 hours prior to test.  On the Night Before the Test: . Be sure to Drink plenty of water. . Do not consume any caffeinated/decaffeinated beverages or chocolate 12 hours prior to your test. . Do not take any antihistamines 12 hours prior to your test. . If you take Metformin do not take 24 hours prior to test  On the Day of the Test: . Drink plenty of water. Do not drink any water within one hour of the test. . Do not eat any food 4 hours prior to the test. . You may take your regular medications prior to the test.  . Take metoprolol (Lopressor) two hours prior to test. . HOLD Furosemide/Hydrochlorothiazide morning of the test. . FEMALES- please wear underwire-free bra if available       After the Test: . Drink plenty of water. . After receiving IV contrast, you may experience a mild flushed feeling. This is  normal. . On occasion, you may experience a mild rash up to 24 hours after the test. This is not dangerous. If this occurs, you can take Benadryl 25 mg and increase your fluid intake. . If you experience trouble breathing, this can be serious. If it is severe call 911 IMMEDIATELY. If it is mild, please call our office. . If you take any of these medications: Glipizide/Metformin, Avandament, Glucavance, please do not take 48 hours after completing test.    Please contact the cardiac imaging nurse navigator should you have any questions/concerns Marchia Bond, RN Navigator Cardiac Imaging [facility] [phone] Office  [phone] Cell           Signed, Addison Naegeli. Audie Box, [place]  [address] [place], [place] [address] [phone]  06/05/2019 10:31 AM

## 2019-06-05 ENCOUNTER — Ambulatory Visit: Payer: Medicare HMO | Admitting: Cardiovascular Disease

## 2019-06-05 ENCOUNTER — Other Ambulatory Visit: Payer: Self-pay

## 2019-06-05 ENCOUNTER — Encounter: Payer: Self-pay | Admitting: Cardiovascular Disease

## 2019-06-05 VITALS — BP 112/66 | HR 79 | Ht 65.0 in | Wt 155.4 lb

## 2019-06-05 DIAGNOSIS — R9431 Abnormal electrocardiogram [ECG] [EKG]: Secondary | ICD-10-CM | POA: Diagnosis not present

## 2019-06-05 DIAGNOSIS — R079 Chest pain, unspecified: Secondary | ICD-10-CM

## 2019-06-05 DIAGNOSIS — R778 Other specified abnormalities of plasma proteins: Secondary | ICD-10-CM

## 2019-06-05 DIAGNOSIS — R7989 Other specified abnormal findings of blood chemistry: Secondary | ICD-10-CM | POA: Diagnosis not present

## 2019-06-05 DIAGNOSIS — I452 Bifascicular block: Secondary | ICD-10-CM

## 2019-06-05 DIAGNOSIS — Z720 Tobacco use: Secondary | ICD-10-CM | POA: Diagnosis not present

## 2019-06-05 MED ORDER — METOPROLOL TARTRATE 50 MG PO TABS: ORAL_TABLET | ORAL | 0 refills | Status: DC

## 2019-06-05 NOTE — Patient Instructions (Addendum)
Medication Instructions:  Take the Metoprolol 100 mg one time dose for the CTA. If you need a refill on your cardiac medications before your next appointment, please call your pharmacy.   Lab work: BMET today If you have labs (blood work) drawn today and your tests are completely normal, you will receive your results only by: Tonya [name] MyChart Message (if you have MyChart) OR . A paper copy in the mail If you have any lab test that is abnormal or we need to change your treatment, we will call you to review the results.  Testing/Procedures: Your physician has requested that you have cardiac CT. Cardiac computed tomography (CT) is a painless test that uses an x-ray machine to take clear, detailed pictures of your heart. For further information please visit [facility]uge[facility]iesta[facility]tn. Please follow instruction sheet as given.  Follow-Up: At [facility], you and your health needs are our priority.  As part of our continuing mission to provide you with exceptional heart care, we have created designated Provider Care Teams.  These Care Teams include your primary Cardiologist (physician) and Advanced Practice Providers (APPs -  Physician Assistants and Nurse Practitioners) who all work together to provide you with the care you need, when you need it. You will need a follow up appointment in 4 months.  Please call our office 2 months in advance to schedule this appointment.  You may see Evalina Field, MD or one of the following Advanced Practice Providers on your designated Care Team: [place], [place] . Fabian Sharp, PA-C   Your cardiac CT will be scheduled at one of the below locations:   [facility] [address] [place], [place] [address] [phone]  [place] [address] [place], [place] [address] [phone]  Please arrive at the [facility] main entrance of [facility] 30-45 minutes prior to test start time. Proceed  to the [facility] Radiology Department (first floor) to check-in and test prep.  Please follow these instructions carefully (unless otherwise directed):  Hold all erectile dysfunction medications at least 48 hours prior to test.  On the Night Before the Test: . Be sure to Drink plenty of water. . Do not consume any caffeinated/decaffeinated beverages or chocolate 12 hours prior to your test. . Do not take any antihistamines 12 hours prior to your test. . If you take Metformin do not take 24 hours prior to test  On the Day of the Test: . Drink plenty of water. Do not drink any water within one hour of the test. . Do not eat any food 4 hours prior to the test. . You may take your regular medications prior to the test.  . Take metoprolol (Lopressor) two hours prior to test. . HOLD Furosemide/Hydrochlorothiazide morning of the test. . FEMALES- please wear underwire-free bra if available       After the Test: . Drink plenty of water. . After receiving IV contrast, you may experience a mild flushed feeling. This is normal. . On occasion, you may experience a mild rash up to 24 hours after the test. This is not dangerous. If this occurs, you can take Benadryl 25 mg and increase your fluid intake. . If you experience trouble breathing, this can be serious. If it is severe call 911 IMMEDIATELY. If it is mild, please call our office. . If you take any of these medications: Glipizide/Metformin, Avandament, Glucavance, please do not take 48 hours after completing test.  Please contact the cardiac imaging nurse navigator should you have any questions/concerns Marchia Bond, RN Navigator Cardiac Imaging [place] [facility] [phone] Office  [phone] Cell

## 2019-06-12 ENCOUNTER — Other Ambulatory Visit (INDEPENDENT_AMBULATORY_CARE_PROVIDER_SITE_OTHER): Payer: Medicare HMO

## 2019-06-12 DIAGNOSIS — I452 Bifascicular block: Secondary | ICD-10-CM | POA: Diagnosis not present

## 2019-06-12 DIAGNOSIS — I10 Essential (primary) hypertension: Secondary | ICD-10-CM

## 2019-06-12 DIAGNOSIS — I493 Ventricular premature depolarization: Secondary | ICD-10-CM | POA: Diagnosis not present

## 2019-06-12 DIAGNOSIS — R7989 Other specified abnormal findings of blood chemistry: Secondary | ICD-10-CM | POA: Diagnosis not present

## 2019-06-12 DIAGNOSIS — R079 Chest pain, unspecified: Secondary | ICD-10-CM

## 2019-06-12 DIAGNOSIS — R9431 Abnormal electrocardiogram [ECG] [EKG]: Secondary | ICD-10-CM | POA: Diagnosis not present

## 2019-06-12 DIAGNOSIS — R778 Other specified abnormalities of plasma proteins: Secondary | ICD-10-CM

## 2019-06-15 DIAGNOSIS — M79672 Pain in left foot: Secondary | ICD-10-CM | POA: Diagnosis not present

## 2019-06-15 DIAGNOSIS — M79671 Pain in right foot: Secondary | ICD-10-CM | POA: Diagnosis not present

## 2019-06-28 DIAGNOSIS — M542 Cervicalgia: Secondary | ICD-10-CM | POA: Diagnosis not present

## 2019-07-02 DIAGNOSIS — I700 Atherosclerosis of aorta: Secondary | ICD-10-CM | POA: Diagnosis not present

## 2019-07-02 DIAGNOSIS — Z0000 Encounter for general adult medical examination without abnormal findings: Secondary | ICD-10-CM | POA: Diagnosis not present

## 2019-07-02 DIAGNOSIS — I10 Essential (primary) hypertension: Secondary | ICD-10-CM | POA: Diagnosis not present

## 2019-07-02 DIAGNOSIS — E7800 Pure hypercholesterolemia, unspecified: Secondary | ICD-10-CM | POA: Diagnosis not present

## 2019-07-02 DIAGNOSIS — M722 Plantar fascial fibromatosis: Secondary | ICD-10-CM | POA: Diagnosis not present

## 2019-07-02 DIAGNOSIS — J309 Allergic rhinitis, unspecified: Secondary | ICD-10-CM | POA: Diagnosis not present

## 2019-07-04 DIAGNOSIS — R7989 Other specified abnormal findings of blood chemistry: Secondary | ICD-10-CM | POA: Diagnosis not present

## 2019-07-04 DIAGNOSIS — Z720 Tobacco use: Secondary | ICD-10-CM | POA: Diagnosis not present

## 2019-07-04 DIAGNOSIS — I452 Bifascicular block: Secondary | ICD-10-CM | POA: Diagnosis not present

## 2019-07-04 DIAGNOSIS — R9431 Abnormal electrocardiogram [ECG] [EKG]: Secondary | ICD-10-CM | POA: Diagnosis not present

## 2019-07-04 DIAGNOSIS — R079 Chest pain, unspecified: Secondary | ICD-10-CM | POA: Diagnosis not present

## 2019-07-05 LAB — BASIC METABOLIC PANEL
BUN/Creatinine Ratio: 27 (ref 12–28)
BUN: 26 mg/dL (ref 8–27)
CO2: 25 mmol/L (ref 20–29)
Calcium: 10.6 mg/dL — ABNORMAL HIGH (ref 8.7–10.3)
Chloride: 103 mmol/L (ref 96–106)
Creatinine, Ser: 0.98 mg/dL (ref 0.57–1.00)
GFR calc Af Amer: 67 mL/min/{1.73_m2} (ref >59)
GFR calc non Af Amer: 58 mL/min/{1.73_m2} — ABNORMAL LOW (ref >59)
Glucose: 117 mg/dL — ABNORMAL HIGH (ref 65–99)
Potassium: 4.1 mmol/L (ref 3.5–5.2)
Sodium: 142 mmol/L (ref 134–144)

## 2019-07-08 ENCOUNTER — Telehealth (HOSPITAL_COMMUNITY): Payer: Self-pay | Admitting: Emergency Medicine

## 2019-07-08 NOTE — Telephone Encounter (Signed)
Reaching out to patient to offer assistance regarding upcoming cardiac imaging study; pt verbalizes understanding of appt date/time, parking situation and where to check in, pre-test NPO status and medications ordered, and verified current allergies; name and call back number provided for further questions should they arise Christohper Dube RN Navigator Cardiac Imaging [place] [facility] [phone] office [phone] cell 

## 2019-07-09 ENCOUNTER — Other Ambulatory Visit: Payer: Self-pay

## 2019-07-09 ENCOUNTER — Encounter (HOSPITAL_COMMUNITY): Payer: Self-pay

## 2019-07-09 ENCOUNTER — Ambulatory Visit (HOSPITAL_COMMUNITY)
Admission: RE | Admit: 2019-07-09 | Discharge: 2019-07-09 | Disposition: A | Discharge status: Home / Self Care | Payer: Medicare HMO | Source: Ambulatory Visit | Attending: Cardiovascular Disease | Admitting: Cardiovascular Disease

## 2019-07-09 DIAGNOSIS — I700 Atherosclerosis of aorta: Secondary | ICD-10-CM | POA: Diagnosis not present

## 2019-07-09 DIAGNOSIS — I2510 Atherosclerotic heart disease of native coronary artery without angina pectoris: Secondary | ICD-10-CM | POA: Diagnosis not present

## 2019-07-09 DIAGNOSIS — R079 Chest pain, unspecified: Secondary | ICD-10-CM

## 2019-07-09 MED ORDER — NITROGLYCERIN 0.4 MG SL SUBL
SUBLINGUAL_TABLET | SUBLINGUAL | Status: AC
  Filled 2019-07-09: qty 2

## 2019-07-09 MED ORDER — IOHEXOL 350 MG/ML SOLN
80.0000 mL | Freq: Once | INTRAVENOUS | Status: AC | PRN
  Administered 2019-07-09: 80 mL via INTRAVENOUS

## 2019-07-09 MED ORDER — NITROGLYCERIN 0.4 MG SL SUBL
0.8000 mg | SUBLINGUAL_TABLET | Freq: Once | SUBLINGUAL | Status: AC
  Administered 2019-07-09: 0.8 mg via SUBLINGUAL
  Filled 2019-07-09: qty 25

## 2019-07-09 NOTE — Progress Notes (Signed)
Pt tolerated exam without incident.  Food and beverage provided post exam.  PIV removed and dressing applied.  Discharge instructions discussed, all questions answered.  Pt discharged

## 2019-07-09 NOTE — Discharge Instructions (Signed)
Testing With IV Contrast Material °IV contrast material is a fluid that is used with some imaging tests. It is injected into your body through a vein. Contrast material is used when your health care providers need a detailed look at organs, tissues, or blood vessels that may not show up with the standard test. The material may be used when an X-ray, an MRI, a CT scan, or an ultrasound is done. °IV contrast material may be used for imaging tests that check: °· Muscles, skin, and fat. °· Breasts. °· Brain. °· Digestive tract. °· Heart. °· Organs such as the liver, kidneys, lungs, bladder, and many others. °· Arteries and veins. °Tell a health care provider about: °· Any allergies you have, especially an allergy to contrast material. °· All medicines you are taking, including metformin, beta blockers, NSAIDs (such as ibuprofen), interleukin-2, vitamins, herbs, eye drops, creams, and over-the-counter medicines. °· Any problems you or family members have had with the use of contrast material. °· Any blood disorders you have, such as sickle cell anemia. °· Any surgeries you have had. °· Any medical conditions you have or have had, especially alcohol abuse, dehydration, asthma, or kidney, liver, or heart problems. °· Whether you are pregnant or may be pregnant. °· Whether you are breastfeeding. Most contrast materials are safe for use in breastfeeding women. °What are the risks? °Generally, this is a safe procedure. However, problems may occur, including: °· Headache. °· Itching, skin rash, and hives. °· Nausea and vomiting. °· Allergic reactions. °· Wheezing or difficulty breathing. °· Abnormal heart rate. °· Changes in blood pressure. °· Throat swelling. °· Kidney damage. °What happens before the procedure? °Medicines °Ask your health care provider about: °· Changing or stopping your regular medicines. This is especially important if you are taking diabetes medicines or blood thinners. °· Taking medicines such as aspirin  and ibuprofen. These medicines can thin your blood. Do not take these medicines unless your health care provider tells you to take them. °· Taking over-the-counter medicines, vitamins, herbs, and supplements. °If you are at risk of having a reaction to the IV contrast material, you may be asked to take medicine before the procedure to prevent a reaction. °General instructions °· Follow instructions from your health care provider about eating or drinking restrictions. °· You may have an exam or lab tests to make sure that you can safely get IV contrast material. °· Ask if you will be given a medicine to help you relax (sedative) during the procedure. If so, plan to have someone take you home from the hospital or clinic. °What happens during the procedure? °· You may be given a sedative to help you relax. °· An IV will be inserted into one of your veins. °· Contrast material will be injected into your IV. °· You may feel warmth or flushing as the contrast material enters your bloodstream. °· You may have a metallic taste in your mouth for a few minutes. °· The needle may cause some discomfort and bruising. °· After the contrast material is in your body, the imaging test will be done. °The procedure may vary among health care providers and hospitals. °What can I expect after the procedure? °· The IV will be removed. °· You may be taken to a recovery area if sedation medicines were used. Your blood pressure, heart rate, breathing rate, and blood oxygen level will be monitored until you leave the hospital or clinic. °Follow these instructions at home: ° °· Take over-the-counter and   prescription medicines only as told by your health care provider. °? Your health care provider may tell you to not take certain medicines for a couple of days after the procedure. This is especially important if you are taking diabetes medicines. °· If you are told, drink enough fluid to keep your urine pale yellow. This will help to remove  the contrast material out of your body. °· Do not drive for 24 hours if you were given a sedative during your procedure. °· It is up to you to get the results of your procedure. Ask your health care provider, or the department that is doing the procedure, when your results will be ready. °· Keep all follow-up visits as told by your health care provider. This is important. °Contact a health care provider if: °· You have redness, swelling, or pain near your IV site. °Get help right away if: °· You have an abnormal heart rhythm. °· You have trouble breathing. °· You have: °? Chest pain. °? Pain in your back, neck, arm, jaw, or stomach. °? Nausea or sweating. °? Hives or a rash. °· You start shaking and cannot stop. °These symptoms may represent a serious problem that is an emergency. Do not wait to see if the symptoms will go away. Get medical help right away. Call your local emergency services (911 in the [place] Do not drive yourself to the hospital. °Summary °· IV contrast material may be used for imaging tests to help your health care providers see your organs and tissues more clearly. °· Tell your health care provider if you are pregnant or may be pregnant. °· During the procedure, you may feel warmth or flushing as the contrast material enters your bloodstream. °· After the procedure, drink enough fluid to keep your urine pale yellow. °This information is not intended to replace advice given to you by your health care provider. Make sure you discuss any questions you have with your health care provider. °Document Released: 09/07/2009 Document Revised: 12/06/2018 Document Reviewed: 12/06/2018 °Elsevier Patient Education © 2020 [employer] ° ° °CT Angiogram ° °A CT angiogram is a procedure to look at the blood vessels in various areas of the body. For this procedure, a large X-ray machine, called a CT scanner, takes detailed pictures of blood vessels that have been injected with a dye (contrast material). °A CT  angiogram allows your health care provider to see how well blood is flowing to the area of your body that is being checked. Your health care provider will be able to see if there are any problems, such as a blockage. °Tell a health care provider about: °· Any allergies you have. °· All medicines you are taking, including vitamins, herbs, eye drops, creams, and over-the-counter medicines. °· Any problems you or family members have had with anesthetic medicines. °· Any blood disorders you have. °· Any surgeries you have had. °· Any medical conditions you have. °· Whether you are pregnant or may be pregnant. °· Whether you are breastfeeding. °· Any anxiety disorders, chronic pain, or other conditions you have that may increase your stress or prevent you from lying still. °What are the risks? °Generally, this is a safe procedure. However, problems may occur, including: °· Infection. °· Bleeding. °· Allergic reactions to medicines or dyes. °· Damage to other structures or organs. °· Kidney damage from the dye or contrast that is used. °· Increased risk of cancer from radiation exposure. This risk is low. Talk with your health care provider   about: °? The risks and benefits of testing. °? How you can receive the lowest dose of radiation. °What happens before the procedure? °· Wear comfortable clothing and remove any jewelry. °· Follow instructions from your health care provider about eating and drinking. For most people, instructions may include these actions: °? For 12 hours before the test, avoid caffeine. This includes tea, coffee, soda, and energy drinks or pills. °? For 3-4 hours before the test, stop eating or drinking anything but water. °? Stay well hydrated by continuing to drink water before the exam. This will help to clear the contrast dye from your body after the test. °· Ask your health care provider about changing or stopping your regular medicines. This is especially important if you are taking diabetes  medicines or blood thinners. °What happens during the procedure? °· An IV tube will be inserted into one of your veins. °· You will be asked to lie on an exam table. This table will slide in and out of the CT machine during the procedure. °· Contrast dye will be injected into the IV tube. You might feel warm, or you may get a metallic taste in your mouth. °· The table that you are lying on will move into the CT machine tunnel for the scan. °· The person running the machine will give you instructions while the scans are being done. You may be asked to: °? Keep your arms above your head. °? Hold your breath. °? Stay very still, even if the table is moving. °· When the scanning is complete, you will be moved out of the machine. °· The IV tube will be removed. °The procedure may vary among health care providers and hospitals. °What happens after the procedure? °· You might feel warm, or you may get a metallic taste in your mouth. °· You may be asked to drink water or other fluids to wash (flush) the contrast material out of your body. °· It is up to you to get the results of your procedure. Ask your health care provider, or the department that is doing the procedure, when your results will be ready. °Summary °· A CT angiogram is a procedure to look at the blood vessels in various areas of the body. °· You will need to stay very still during the exam. °· You may be asked to drink water or other fluids to wash (flush) the contrast material out of your body after your scan. °This information is not intended to replace advice given to you by your health care provider. Make sure you discuss any questions you have with your health care provider. °Document Released: 05/19/2016 Document Revised: 11/29/2018 Document Reviewed: 05/19/2016 °Elsevier Patient Education © 2020 [employer] ° ° °

## 2019-07-10 ENCOUNTER — Telehealth: Payer: Self-pay | Admitting: Cardiovascular Disease

## 2019-07-10 DIAGNOSIS — I700 Atherosclerosis of aorta: Secondary | ICD-10-CM | POA: Diagnosis not present

## 2019-07-10 DIAGNOSIS — R079 Chest pain, unspecified: Secondary | ICD-10-CM | POA: Diagnosis not present

## 2019-07-10 DIAGNOSIS — M7672 Peroneal tendinitis, left leg: Secondary | ICD-10-CM | POA: Diagnosis not present

## 2019-07-10 DIAGNOSIS — M65871 Other synovitis and tenosynovitis, right ankle and foot: Secondary | ICD-10-CM | POA: Diagnosis not present

## 2019-07-10 NOTE — Telephone Encounter (Signed)
Attempted to call Mrs. Welchel with CCTA results. Calcified lesions that are non-obstructive seen on CTA. Will just need aggressive risk factor modification. Will need to start ASA 81 mg daily and crestor 20 mg QHS. Left message for her to call us back.   Evalina Field, MD

## 2019-07-10 NOTE — Telephone Encounter (Signed)
Follow up ° ° °Patient is returning your call. Please call. ° ° ° °

## 2019-07-11 ENCOUNTER — Telehealth: Payer: Self-pay | Admitting: Cardiovascular Disease

## 2019-07-11 NOTE — Telephone Encounter (Signed)
Called Tonya [name] about results. CAC score 1100 but no obstructive CAD. Recommended ASA and statin. She is ok with aspirin but would like to discuss statin in person. Will send note to get her in to see me in 2 months.  Evalina Field, MD

## 2019-07-12 DIAGNOSIS — E7800 Pure hypercholesterolemia, unspecified: Secondary | ICD-10-CM | POA: Diagnosis not present

## 2019-07-12 DIAGNOSIS — M722 Plantar fascial fibromatosis: Secondary | ICD-10-CM | POA: Diagnosis not present

## 2019-07-12 DIAGNOSIS — Z23 Encounter for immunization: Secondary | ICD-10-CM | POA: Diagnosis not present

## 2019-07-15 DIAGNOSIS — M79672 Pain in left foot: Secondary | ICD-10-CM | POA: Diagnosis not present

## 2019-07-15 DIAGNOSIS — M79671 Pain in right foot: Secondary | ICD-10-CM | POA: Diagnosis not present

## 2019-07-22 DIAGNOSIS — M542 Cervicalgia: Secondary | ICD-10-CM | POA: Diagnosis not present

## 2019-08-15 DIAGNOSIS — M79672 Pain in left foot: Secondary | ICD-10-CM | POA: Diagnosis not present

## 2019-08-15 DIAGNOSIS — M79671 Pain in right foot: Secondary | ICD-10-CM | POA: Diagnosis not present

## 2019-09-03 ENCOUNTER — Other Ambulatory Visit: Payer: Self-pay | Admitting: Family Medicine

## 2019-09-03 ENCOUNTER — Ambulatory Visit
Admission: RE | Admit: 2019-09-03 | Discharge: 2019-09-03 | Disposition: A | Discharge status: Home / Self Care | Payer: Medicare HMO | Source: Ambulatory Visit | Attending: Family Medicine | Admitting: Family Medicine

## 2019-09-03 DIAGNOSIS — M79642 Pain in left hand: Secondary | ICD-10-CM

## 2019-09-03 DIAGNOSIS — M19042 Primary osteoarthritis, left hand: Secondary | ICD-10-CM | POA: Diagnosis not present

## 2019-09-03 DIAGNOSIS — M19032 Primary osteoarthritis, left wrist: Secondary | ICD-10-CM | POA: Diagnosis not present

## 2019-09-11 DIAGNOSIS — Z1231 Encounter for screening mammogram for malignant neoplasm of breast: Secondary | ICD-10-CM | POA: Diagnosis not present

## 2019-09-14 DIAGNOSIS — M79672 Pain in left foot: Secondary | ICD-10-CM | POA: Diagnosis not present

## 2019-09-14 DIAGNOSIS — M79671 Pain in right foot: Secondary | ICD-10-CM | POA: Diagnosis not present

## 2019-09-16 ENCOUNTER — Ambulatory Visit: Payer: Medicare HMO | Admitting: Cardiovascular Disease

## 2019-10-15 DIAGNOSIS — M79671 Pain in right foot: Secondary | ICD-10-CM | POA: Diagnosis not present

## 2019-10-15 DIAGNOSIS — M79672 Pain in left foot: Secondary | ICD-10-CM | POA: Diagnosis not present

## 2019-10-26 ENCOUNTER — Ambulatory Visit: Payer: Medicare HMO | Attending: Internal Medicine

## 2019-10-26 DIAGNOSIS — Z23 Encounter for immunization: Secondary | ICD-10-CM

## 2019-10-26 NOTE — Progress Notes (Signed)
   Covid-19 Vaccination Clinic  Name:  WANEDA CAPULONG    MRN: [id number] DOB: Jan 14, 1946  10/26/2019  Ms. Shue was observed post Covid-19 immunization for 15 minutes without incidence. She was provided with Vaccine Information Sheet and instruction to access the V-Safe system.   Ms. Valente was instructed to call 911 with any severe reactions post vaccine: Marland Kitchen Difficulty breathing  . Swelling of your face and throat  . A fast heartbeat  . A bad rash all over your body  . Dizziness and weakness    Immunizations Administered    Name Date Dose VIS Date Route   Pfizer COVID-19 Vaccine 10/26/2019 11:31 AM 0.3 mL 09/13/2019 Intramuscular   Manufacturer: [place]   Lot: [id number]   [place]: [id number]

## 2019-11-16 ENCOUNTER — Ambulatory Visit: Payer: Medicare HMO | Attending: Internal Medicine

## 2019-11-16 DIAGNOSIS — Z23 Encounter for immunization: Secondary | ICD-10-CM

## 2019-11-16 NOTE — Progress Notes (Signed)
   Covid-19 Vaccination Clinic  Name:  Tonya [name]    MRN: [id number] DOB: [birth year]  11/16/2019  Tonya [name] was observed post Covid-19 immunization for 15 minutes without incidence. She was provided with Vaccine Information Sheet and instruction to access the V-Safe system.   Tonya [name] was instructed to call 911 with any severe reactions post vaccine: Marland Kitchen Difficulty breathing  . Swelling of your face and throat  . A fast heartbeat  . A bad rash all over your body  . Dizziness and weakness    Immunizations Administered    Name Date Dose VIS Date Route   Pfizer COVID-19 Vaccine 11/16/2019  8:54 AM 0.3 mL 09/13/2019 Intramuscular   Manufacturer: [place]   Lot: [id number]   [place]: [id number]

## 2020-01-12 ENCOUNTER — Emergency Department (HOSPITAL_COMMUNITY): Payer: Medicare HMO

## 2020-01-12 ENCOUNTER — Observation Stay (HOSPITAL_COMMUNITY)
Admission: EM | Admit: 2020-01-12 | Discharge: 2020-01-13 | Disposition: A | Discharge status: Home / Self Care | Payer: Medicare HMO | Attending: Cardiovascular Disease | Admitting: Cardiovascular Disease

## 2020-01-12 ENCOUNTER — Encounter (HOSPITAL_COMMUNITY): Payer: Self-pay | Admitting: Emergency Medicine

## 2020-01-12 ENCOUNTER — Other Ambulatory Visit: Payer: Self-pay

## 2020-01-12 DIAGNOSIS — M1990 Unspecified osteoarthritis, unspecified site: Secondary | ICD-10-CM | POA: Diagnosis not present

## 2020-01-12 DIAGNOSIS — E785 Hyperlipidemia, unspecified: Secondary | ICD-10-CM | POA: Diagnosis not present

## 2020-01-12 DIAGNOSIS — F17200 Nicotine dependence, unspecified, uncomplicated: Secondary | ICD-10-CM | POA: Diagnosis present

## 2020-01-12 DIAGNOSIS — E872 Acidosis, unspecified: Secondary | ICD-10-CM

## 2020-01-12 DIAGNOSIS — I9589 Other hypotension: Secondary | ICD-10-CM | POA: Diagnosis present

## 2020-01-12 DIAGNOSIS — I2510 Atherosclerotic heart disease of native coronary artery without angina pectoris: Secondary | ICD-10-CM | POA: Insufficient documentation

## 2020-01-12 DIAGNOSIS — I10 Essential (primary) hypertension: Secondary | ICD-10-CM | POA: Diagnosis present

## 2020-01-12 DIAGNOSIS — N179 Acute kidney failure, unspecified: Secondary | ICD-10-CM | POA: Diagnosis not present

## 2020-01-12 DIAGNOSIS — I248 Other forms of acute ischemic heart disease: Secondary | ICD-10-CM | POA: Diagnosis not present

## 2020-01-12 DIAGNOSIS — R55 Syncope and collapse: Secondary | ICD-10-CM | POA: Diagnosis not present

## 2020-01-12 DIAGNOSIS — F17210 Nicotine dependence, cigarettes, uncomplicated: Secondary | ICD-10-CM | POA: Insufficient documentation

## 2020-01-12 DIAGNOSIS — Z7901 Long term (current) use of anticoagulants: Secondary | ICD-10-CM | POA: Diagnosis not present

## 2020-01-12 DIAGNOSIS — I4891 Unspecified atrial fibrillation: Secondary | ICD-10-CM | POA: Diagnosis not present

## 2020-01-12 DIAGNOSIS — E860 Dehydration: Secondary | ICD-10-CM | POA: Diagnosis present

## 2020-01-12 DIAGNOSIS — I452 Bifascicular block: Secondary | ICD-10-CM | POA: Diagnosis not present

## 2020-01-12 DIAGNOSIS — I119 Hypertensive heart disease without heart failure: Secondary | ICD-10-CM | POA: Insufficient documentation

## 2020-01-12 DIAGNOSIS — Z79899 Other long term (current) drug therapy: Secondary | ICD-10-CM | POA: Insufficient documentation

## 2020-01-12 DIAGNOSIS — I4892 Unspecified atrial flutter: Secondary | ICD-10-CM | POA: Diagnosis not present

## 2020-01-12 DIAGNOSIS — Z20822 Contact with and (suspected) exposure to covid-19: Secondary | ICD-10-CM | POA: Diagnosis not present

## 2020-01-12 DIAGNOSIS — Z791 Long term (current) use of non-steroidal anti-inflammatories (NSAID): Secondary | ICD-10-CM | POA: Diagnosis not present

## 2020-01-12 DIAGNOSIS — R778 Other specified abnormalities of plasma proteins: Secondary | ICD-10-CM

## 2020-01-12 LAB — CBC
HCT: 41.1 % (ref 36.0–46.0)
Hemoglobin: 13.4 g/dL (ref 12.0–15.0)
MCH: 31.8 pg (ref 26.0–34.0)
MCHC: 32.6 g/dL (ref 30.0–36.0)
MCV: 97.6 fL (ref 80.0–100.0)
Platelets: 259 10*3/uL (ref 150–400)
RBC: 4.21 MIL/uL (ref 3.87–5.11)
RDW: 13.2 % (ref 11.5–15.5)
WBC: 6.1 10*3/uL (ref 4.0–10.5)
nRBC: 0 % (ref 0.0–0.2)

## 2020-01-12 LAB — LACTIC ACID, PLASMA
Lactic Acid, Venous: 2.9 mmol/L (ref 0.5–1.9)
Lactic Acid, Venous: 1.2 mmol/L (ref 0.5–1.9)

## 2020-01-12 LAB — COMPREHENSIVE METABOLIC PANEL
ALT: 16 U/L (ref 0–44)
AST: 21 U/L (ref 15–41)
Albumin: 3.8 g/dL (ref 3.5–5.0)
Alkaline Phosphatase: 38 U/L (ref 38–126)
Anion gap: 13 (ref 5–15)
BUN: 17 mg/dL (ref 8–23)
CO2: 22 mmol/L (ref 22–32)
Calcium: 9.9 mg/dL (ref 8.9–10.3)
Chloride: 107 mmol/L (ref 98–111)
Creatinine, Ser: 1.42 mg/dL — ABNORMAL HIGH (ref 0.44–1.00)
GFR calc Af Amer: 43 mL/min — ABNORMAL LOW (ref >60)
GFR calc non Af Amer: 37 mL/min — ABNORMAL LOW (ref >60)
Glucose, Bld: 145 mg/dL — ABNORMAL HIGH (ref 70–99)
Potassium: 3.6 mmol/L (ref 3.5–5.1)
Sodium: 142 mmol/L (ref 135–145)
Total Bilirubin: 0.6 mg/dL (ref 0.3–1.2)
Total Protein: 6.7 g/dL (ref 6.5–8.1)

## 2020-01-12 LAB — SAMPLE TO BLOOD BANK

## 2020-01-12 LAB — CBG MONITORING, ED: Glucose-Capillary: 120 mg/dL — ABNORMAL HIGH (ref 70–99)

## 2020-01-12 LAB — TROPONIN I (HIGH SENSITIVITY)
Troponin I (High Sensitivity): 36 ng/L — ABNORMAL HIGH (ref <18)
Troponin I (High Sensitivity): 177 ng/L (ref <18)

## 2020-01-12 LAB — RESPIRATORY PANEL BY RT PCR (FLU A&B, COVID)
Influenza A by PCR: NEGATIVE
Influenza B by PCR: NEGATIVE
SARS Coronavirus 2 by RT PCR: NEGATIVE

## 2020-01-12 LAB — T4, FREE: Free T4: 0.76 ng/dL (ref 0.61–1.12)

## 2020-01-12 LAB — TSH: TSH: 0.526 u[IU]/mL (ref 0.350–4.500)

## 2020-01-12 MED ORDER — CYCLOSPORINE 0.05 % OP EMUL
1.0000 [drp] | Freq: Two times a day (BID) | OPHTHALMIC | Status: DC
  Administered 2020-01-13: 1 [drp] via OPHTHALMIC
  Filled 2020-01-12 (×3): qty 1

## 2020-01-12 MED ORDER — DILTIAZEM LOAD VIA INFUSION
10.0000 mg | Freq: Once | INTRAVENOUS | Status: AC
  Administered 2020-01-12: 10 mg via INTRAVENOUS
  Filled 2020-01-12: qty 10

## 2020-01-12 MED ORDER — ACETAMINOPHEN 325 MG PO TABS: 650.0000 mg | ORAL_TABLET | ORAL | Status: DC | PRN

## 2020-01-12 MED ORDER — SODIUM CHLORIDE 0.9 % IV BOLUS
1000.0000 mL | Freq: Once | INTRAVENOUS | Status: AC
  Administered 2020-01-12: 1000 mL via INTRAVENOUS

## 2020-01-12 MED ORDER — APIXABAN 5 MG PO TABS
5.0000 mg | ORAL_TABLET | Freq: Two times a day (BID) | ORAL | Status: DC
  Administered 2020-01-12 – 2020-01-13 (×2): 5 mg via ORAL
  Filled 2020-01-12 (×3): qty 1

## 2020-01-12 MED ORDER — ONDANSETRON HCL 4 MG/2ML IJ SOLN: 4.0000 mg | Freq: Four times a day (QID) | INTRAMUSCULAR | Status: DC | PRN

## 2020-01-12 MED ORDER — DILTIAZEM HCL-DEXTROSE 125-5 MG/125ML-% IV SOLN (PREMIX)
5.0000 mg/h | INTRAVENOUS | Status: DC
  Administered 2020-01-12: 5 mg/h via INTRAVENOUS
  Filled 2020-01-12: qty 125

## 2020-01-12 NOTE — ED Notes (Signed)
The pt a;ppears to be in nsr at present  cardizem drip  Decreased to 26ml

## 2020-01-12 NOTE — Significant Event (Signed)
Cardiology Moon[facility] Note  After 2L IV fluid, patient spontaneously cardioverted to NSR with rate in the 60's. Blood pressure improving. Lactate resolved to 1.2. Troponin increased from 36 to 177 however patient without ischemic symptoms and normal coronary CTA from several months ago. Scheduled for first dose of apixaban tonight. Will continue to monitor overnight. Cardiology team to see patient in AM. May be able to be discharged tomorrow pending clinical course.   Marcie Mowers, MD Cardiology Fellow, PGY-7

## 2020-01-12 NOTE — Progress Notes (Signed)
ANTICOAGULATION CONSULT NOTE - Initial Consult  Pharmacy Consult for Apixaban Indication: atrial fibrillation  Allergies  Allergen Reactions  . Penicillins Rash    Patient Measurements: Height: 5\' 5"  (165.1 cm) Weight: 70 kg (154 lb 5.2 oz) IBW/kg (Calculated) : 57 Heparin Dosing Weight:   Vital Signs: Temp: 97.8 F (36.6 C) (04/11 1710) Temp Source: Temporal (04/11 1710) BP: 123/59 (04/11 1940) Pulse Rate: 64 (04/11 1940)  Labs: Recent Labs    01/12/20 1706  HGB 13.4  HCT 41.1  PLT 259  CREATININE 1.42*  TROPONINIHS 36*    Estimated Creatinine Clearance: 35.2 mL/min (A) (by C-G formula based on SCr of 1.42 mg/dL (H)).   Medical History: Past Medical History:  Diagnosis Date  . Allergy   . Hyperlipidemia   . Hypertension   . [name] disease    per her report from skin biopsy prior  . Osteoarthritis    knee, hips  . Plantar fasciitis    right foot  . Tobacco use disorder     Medications:  Scheduled:  . apixaban  5 mg Oral BID    Assessment: Patient is a 73 yof that presented to the ED with weakness and palpitations. Patient was found to be in Afib with RVR. Pharmacy has been as asked to dose Apixaban for Afib.   Goal of Therapy:  Monitor platelets by anticoagulation protocol: Yes   Plan:  - Patient is <8 years old, Scr< 1.5, and Weight is > 60.  - Start patient of Apixaban 5mg  PO BID  - Monitor patient for s/s of bleeding and cbc  Duanne Limerick PharmD. BCPS  01/12/2020,8:07 PM

## 2020-01-12 NOTE — ED Notes (Signed)
Pt feeling intermittent  Lightheadedness for awhile  She arrived with a rapid  Heart rate  At present she continues to have a rapid heart rate   Nasal 02 at 6  nss bolus running wo 1 liter  The pt remains alert and oriented skin warm   And dry  She has been under a lot of stress lately

## 2020-01-12 NOTE — ED Triage Notes (Signed)
Pt reports she felt dizzy and lightheaded today and seeing spots so she went outside to do yard work and felt worse, pt very poor historian in triage, diaphoretic, unable to obtain BP.

## 2020-01-12 NOTE — ED Notes (Signed)
Pt moved to exam room, pt more lucid after positioned supine on stretcher. Now able to obtain BP and history

## 2020-01-12 NOTE — ED Notes (Signed)
Report called to rn on 6e 

## 2020-01-12 NOTE — ED Notes (Signed)
Unsuccessful attempt to call report tor 6e  rn with a pt will need to call me back

## 2020-01-12 NOTE — H&P (Signed)
Cardiology Admission History and Physical:   Patient ID: Tonya [name] MRN: [id number]; DOB: [birth year]   Admission date: 01/12/2020  Primary Care Provider: Alroy Dust, L.Marlou Sa, MD Primary Cardiologist: Evalina Field, MD  Primary Electrophysiologist:  None   Chief Complaint:  Weakness and palpitations  Patient Profile:   Tonya [name] is a 74 y.o. female with history of HTN, HLD, smoking presents with weakness and palpitations.  History of Present Illness:   Tonya [name] reports feeling feeling symptoms of weakness and palpitations all day today. She has not felt like this before. She feels her heart beating fast in her chest and she can feel this sensation in her head and neck as well. She denies chest pain, chest pressure, shortness of breath, syncope, fevers, chills, nausea, vomiting, diarrhea, leg swelling, orthopnea, PND. She has been under a great deal of stress lately as she is now caring for her sister with dementia full time. She smokes cigarettes daily for nearly 60 years. Although she has not had a syncopal episode, she does feel dizzy and lightheaded. She has had no falls or trauma. She drinks less than 1 drink of alcohol per week, she denies illicit drug use, and she drinks 1 cup of coffee per day. She does not think that she had similar symptoms yesterday, though she states it is difficult to tell because of all the stress she has been under, she has been overwhelmed and not feeling well for days now.  Given her progressive symptoms today she came to the ED and was found to be in AF with RVR. She has no known prior history of AF. Her systolic blood pressure was in the 90's to 100's and heart rate in the 110's to 120's in AF. Her other vital signs were within normal limits. She was found to have a minimally elevated high sensitivity troponin, a minimally elevated lactate (2.9), and a minimally elevated creatinine (1.4 over baseline around 1.0). She was given 1L IV fluid and  started on a diltiazem drip for rate control.   Also of note the patient denies any focal infectious symptoms however she does state that she has not been eating or drinking well lately due to all of the stress she has been under caring for her sister. She has been taking all of her medications regularly including her blood pressure medication.   Heart Pathway Score:     Past Medical History:  Diagnosis Date  . Allergy   . Hyperlipidemia   . Hypertension   . [name] disease    per her report from skin biopsy prior  . Osteoarthritis    knee, hips  . Plantar fasciitis    right foot  . Tobacco use disorder     Past Surgical History:  Procedure Laterality Date  . ABDOMINAL HYSTERECTOMY     partial  . CHOLECYSTECTOMY  2003  . COLONOSCOPY  2010  . WISDOM TOOTH EXTRACTION       Medications Prior to Admission: Prior to Admission medications   Medication Sig Start Date End Date Taking? Authorizing Provider  buPROPion (WELLBUTRIN XL) 150 MG 24 hr tablet TAKE 1 TABLET (150 MG TOTAL) BY MOUTH DAILY. Patient not taking: Reported on 06/05/2019 09/06/16   Midge Minium, MD  cetirizine (ZYRTEC) 10 MG tablet Take 1 tablet (10 mg total) by mouth daily. 05/29/15   Midge Minium, MD  cyclobenzaprine (FLEXERIL) 5 MG tablet Take 1 tablet (5 mg total) by mouth 3 (three) times daily as needed  for muscle spasms. 05/17/19   Drenda Freeze, MD  cycloSPORINE (RESTASIS) 0.05 % ophthalmic emulsion 1 drop 2 (two) times daily.    [provider]  fluticasone (FLONASE) 50 MCG/ACT nasal spray Place 2 sprays into both nostrils daily. 05/29/15   Midge Minium, MD  meloxicam (MOBIC) 15 MG tablet TAKE 1 TABLET BY MOUTH ONCE DAILY WITH FOOD Patient not taking: Reported on 06/05/2019 06/22/16   Midge Minium, MD  metoprolol tartrate (LOPRESSOR) 50 MG tablet Take 1 tablet by mouth once for procedure. 06/05/19   O'NealCassie Freer, MD  Multiple Vitamins-Minerals (VITAMIN D3  COMPLETE PO) Take by mouth. Taking 2-3 tablets daily    [provider]  Probiotic CAPS Take 1 capsule by mouth every other day.    [provider]  valsartan-hydrochlorothiazide (DIOVAN-HCT) 320-12.5 MG tablet Take 1 tablet by mouth daily. 05/17/19   Drenda Freeze, MD     Allergies:    Allergies  Allergen Reactions  . Penicillins Rash    Social History:   Social History   Socioeconomic History  . Marital status: Single    Spouse name: Not on file  . Number of children: Not on file  . Years of education: Not on file  . Highest education level: Not on file  Occupational History  . Not on file  Tobacco Use  . Smoking status: Current Every Day Smoker    Packs/day: 1.00    Years: 50.00    Pack years: 50.00    Types: Cigarettes  . Smokeless tobacco: Never Used  Substance and Sexual Activity  . Alcohol use: Not Currently    Alcohol/week: 0.0 standard drinks    Comment: occ  . Drug use: No  . Sexual activity: Never  Other Topics Concern  . Not on file  Social History Narrative  . Not on file   Social Determinants of Health   Financial Resource Strain:   . Difficulty of Paying Living Expenses:   Food Insecurity:   . Worried About Charity fundraiser in the Last Year:   . Arboriculturist in the Last Year:   Transportation Needs:   . Film/video editor (Medical):   Marland Kitchen Lack of Transportation (Non-Medical):   Physical Activity:   . Days of Exercise per Week:   . Minutes of Exercise per Session:   Stress:   . Feeling of Stress :   Social Connections:   . Frequency of Communication with Friends and Family:   . Frequency of Social Gatherings with Friends and Family:   . Attends Religious Services:   . Active Member of Clubs or Organizations:   . Attends Archivist Meetings:   Marland Kitchen Marital Status:   Intimate Partner Violence:   . Fear of Current or Ex-Partner:   . Emotionally Abused:   Marland Kitchen Physically Abused:   . Sexually Abused:       Family History:   The patient's family history includes Heart disease in her father; Hypertension in her mother; Liver disease in her brother; Other in her brother; Pneumonia in her brother; Stroke in her mother.    ROS:  Please see the history of present illness.  All other ROS reviewed and negative.     Physical Exam/Data:   Vitals:   01/12/20 1740 01/12/20 1745 01/12/20 1750 01/12/20 1755  BP: (!) 93/54 92/60 (!) 86/55 (!) 97/52  Pulse:  (!) 25    Resp: 19 13 13 15   Temp:  TempSrc:      SpO2:  100%    Weight:      Height:        Intake/Output Summary (Last 24 hours) at 01/12/2020 1821 Last data filed at 01/12/2020 1800 Gross per 24 hour  Intake 1000 ml  Output --  Net 1000 ml   Last 3 Weights 01/12/2020 06/05/2019 05/17/2019  Weight (lbs) 154 lb 5.2 oz 155 lb 6.4 oz 157 lb  Weight (kg) 70 kg 70.489 kg 71.215 kg     Body mass index is 25.68 kg/m.  General:  Well nourished, well developed, in no acute distress HEENT: normal Lymph: no adenopathy Neck: no JVD Endocrine:  No thryomegaly Vascular: No carotid bruits; FA pulses 2+ bilaterally without bruits  Cardiac:  Irregularly irregular rhythm, tachycardic, no appreciable rubs, murmurs or gallops Lungs:  clear to auscultation bilaterally, no wheezing, rhonchi or rales  Abd: soft, nontender, no hepatomegaly  Ext: no edema Musculoskeletal:  No deformities, BUE and BLE strength normal and equal Skin: warm and dry  Neuro:  CNs 2-12 intact, no focal abnormalities noted Psych:  Normal affect    EKG:  The ECG that was done on arrival to the ED was personally reviewed and demonstrates AF with RVR (HR 122), RBBB and LAFB (both old), no ST or T wave abnormalities  Relevant CV Studies: Coronary CT scan from 07/2019:  1. Left Main: No significant stenosis.  2. LAD: 0.95; no significant stenosis. 3. LCX: 0.85; no significant stenosis. 4. RCA: 0.93; no significant stenosis.  IMPRESSION: 1.  CT FFR analysis didn't show  any significant stenosis.  Laboratory Data:  High Sensitivity Troponin:   Recent Labs  Lab 01/12/20 1706  TROPONINIHS 36*      Chemistry Recent Labs  Lab 01/12/20 1706  NA 142  K 3.6  CL 107  CO2 22  GLUCOSE 145*  BUN 17  CREATININE 1.42*  CALCIUM 9.9  GFRNONAA 37*  GFRAA 43*  ANIONGAP 13    Recent Labs  Lab 01/12/20 1706  PROT 6.7  ALBUMIN 3.8  AST 21  ALT 16  ALKPHOS 38  BILITOT 0.6   Hematology Recent Labs  Lab 01/12/20 1706  WBC 6.1  RBC 4.21  HGB 13.4  HCT 41.1  MCV 97.6  MCH 31.8  MCHC 32.6  RDW 13.2  PLT 259   BNPNo results for input(s): BNP, PROBNP in the last 168 hours.  DDimer No results for input(s): DDIMER in the last 168 hours.   Radiology/Studies:  DG Chest Port 1 View  Result Date: 01/12/2020 CLINICAL DATA:  Weakness EXAM: PORTABLE CHEST 1 VIEW COMPARISON:  05/17/2019 FINDINGS: Stable mild cardiomegaly. Atherosclerotic calcification of the aortic knob. No focal airspace consolidation, pleural effusion, or pneumothorax. IMPRESSION: No active disease. Electronically Signed   By: Davina Poke D.O.   On: 01/12/2020 17:32     Assessment and Plan:  Tonya [name] is a 74 y.o. female with history of HTN, HLD, smoking presents with weakness and palpitations, found to be in new AF.   #) New onset atrial fibrillation: CHADS-VASc score of 2 (HTN and age). Although it sounds like her symptoms began today, she cannot specifically recall whether she has had similar symptoms leading up to today's presentation. Given this and her risk factors for AF (smoking, HTN), would be reasonable to admit for TEE/DCCV. Patient appears volume down on initial exam. Will hydrate. No other obvious drivers for new AF, other than psychosocial stressors.  - TSH - IV fluid -  repeat troponin x1 for trend - echo tomorrow - NPO past MN for possible TEE/DCCV - apixaban 5mg  BID starting tonight - diltiazem drip 5mg /hr tonight, titrate for goal HR <120 if  asymptomatic or <100 if symptomatic - telemetry, monitor for spontaneous cardioversion - will need cardiology follow up, previously seen by Dr Audie Box  #) HoTN, AKI, elevated lactate: likely due to AF and volume depletion in setting of poor PO intake. No symptoms suggestive of infectious process.  - IV fluid as per above - repeat lactate - BMP in AM - hold anti-hypertensives  #) Elevated troponin: has been elevated to same degree on multiple prior checks. Coronary CTA scan from 07/2019 without any evidence of CAD. - risk factor modification - check A1c, lipids  #) HTN:  - hold home antihypertensives as per above  Severity of Illness: The appropriate patient status for this patient is INPATIENT. Inpatient status is judged to be reasonable and necessary in order to provide the required intensity of service to ensure the patient's safety. The patient's presenting symptoms, physical exam findings, and initial radiographic and laboratory data in the context of their chronic comorbidities is felt to place them at high risk for further clinical deterioration. Furthermore, it is not anticipated that the patient will be medically stable for discharge from the hospital within 2 midnights of admission. The following factors support the patient status of inpatient.   " The patient's presenting symptoms include presyncope. " The worrisome physical exam findings include volume depletion. " The initial radiographic and laboratory data are worrisome because of new AF. " The chronic co-morbidities include smoking, HTN.   * I certify that at the point of admission it is my clinical judgment that the patient will require inpatient hospital care spanning beyond 2 midnights from the point of admission due to high intensity of service, high risk for further deterioration and high frequency of surveillance required.*    For questions or updates, please contact [facility] Please consult [email or web address] for  contact info under        Signed, Marcie Mowers, MD  01/12/2020 6:21 PM

## 2020-01-12 NOTE — ED Notes (Signed)
The pts heart rate is still irregular but her heart rate is is 76

## 2020-01-12 NOTE — ED Notes (Signed)
Cards at    The bedside

## 2020-01-12 NOTE — ED Notes (Signed)
Cards called   Order to take cardizem totally off  ekg done  For nsr

## 2020-01-12 NOTE — ED Provider Notes (Signed)
[facility]ERGENCY DEPARTMENT Provider Note   CSN: [id number] Arrival date & time: 01/12/20  1637     History Chief Complaint  Patient presents with  . Near Syncope    Tonya [name] is a 74 y.o. female.  Patient c/o feeling faint, lightheaded since getting up this AM. Symptoms acute onset, moderate-severe, constant, persistent, worse w standing. No syncope, trauma or fall. Denies hx same. Has intermittently noted that heart seems fast and/or irregular - denies hx same, pt unsure when this started. Denies hx afib or dysrhythmia. No chest pain or discomfort, but rather pt says did feel odd/funny in chest all day.  Denies cough or uri symptoms. No fever or chills. No recent blood loss, vaginal bleeding, rectal bleeding or melena. No recent med changes. No dysuria or gu c/o. No recent wt loss.   The history is provided by the patient.  Near Syncope Pertinent negatives include no chest pain, no abdominal pain, no headaches and no shortness of breath.       Past Medical History:  Diagnosis Date  . Allergy   . Hyperlipidemia   . Hypertension   . [name] disease    per her report from skin biopsy prior  . Osteoarthritis    knee, hips  . Plantar fasciitis    right foot  . Tobacco use disorder     Patient Active Problem List   Diagnosis Date Noted  . Physical exam 07/02/2015  . Eustachian tube dysfunction 05/29/2015  . Hyperlipidemia 06/04/2013  . PVCs (premature ventricular contractions) 06/04/2013  . Snoring 11/08/2011  . Sleep disturbance 11/08/2011  . Need for prophylactic vaccination and inoculation against influenza 11/08/2011  . Essential hypertension, benign 07/13/2011  . Tobacco use disorder 07/13/2011    Past Surgical History:  Procedure Laterality Date  . ABDOMINAL HYSTERECTOMY     partial  . CHOLECYSTECTOMY  2003  . COLONOSCOPY  2010  . WISDOM TOOTH EXTRACTION       OB History   No obstetric history on file.      Family History  Problem Relation Age of Onset  . Hypertension Mother   . Stroke Mother        multiple  . Heart disease Father        reportedly died of MI  . Pneumonia Brother        died of pneumonia  . Other Brother        died in surgery, reportedly due to lung disease  . Liver disease Brother        died of NASH    Social History   Tobacco Use  . Smoking status: Current Every Day Smoker    Packs/day: 1.00    Years: 50.00    Pack years: 50.00    Types: Cigarettes  . Smokeless tobacco: Never Used  Substance Use Topics  . Alcohol use: Not Currently    Alcohol/week: 0.0 standard drinks    Comment: occ  . Drug use: No    Home Medications Prior to Admission medications   Medication Sig Start Date End Date Taking? Authorizing Provider  buPROPion (WELLBUTRIN XL) 150 MG 24 hr tablet TAKE 1 TABLET (150 MG TOTAL) BY MOUTH DAILY. Patient not taking: Reported on 06/05/2019 09/06/16   Midge Minium, MD  cetirizine (ZYRTEC) 10 MG tablet Take 1 tablet (10 mg total) by mouth daily. 05/29/15   Midge Minium, MD  cyclobenzaprine (FLEXERIL) 5 MG tablet Take 1 tablet (5 mg total) by  mouth 3 (three) times daily as needed for muscle spasms. 05/17/19   Drenda Freeze, MD  cycloSPORINE (RESTASIS) 0.05 % ophthalmic emulsion 1 drop 2 (two) times daily.    [provider]  fluticasone (FLONASE) 50 MCG/ACT nasal spray Place 2 sprays into both nostrils daily. 05/29/15   Midge Minium, MD  meloxicam (MOBIC) 15 MG tablet TAKE 1 TABLET BY MOUTH ONCE DAILY WITH FOOD Patient not taking: Reported on 06/05/2019 06/22/16   Midge Minium, MD  metoprolol tartrate (LOPRESSOR) 50 MG tablet Take 1 tablet by mouth once for procedure. 06/05/19   O'NealCassie Freer, MD  Multiple Vitamins-Minerals (VITAMIN D3 COMPLETE PO) Take by mouth. Taking 2-3 tablets daily    [provider]  Probiotic CAPS Take 1 capsule by mouth every other day.    [provider]   valsartan-hydrochlorothiazide (DIOVAN-HCT) 320-12.5 MG tablet Take 1 tablet by mouth daily. 05/17/19   Drenda Freeze, MD    Allergies    Penicillins  Review of Systems   Review of Systems  Constitutional: Negative for chills and fever.  HENT: Negative for sore throat.   Eyes: Negative for redness and visual disturbance.  Respiratory: Negative for cough and shortness of breath.   Cardiovascular: Positive for palpitations and near-syncope. Negative for chest pain and leg swelling.  Gastrointestinal: Negative for abdominal pain, blood in stool, diarrhea and vomiting.  Endocrine: Negative for polyuria.  Genitourinary: Negative for dysuria and flank pain.  Musculoskeletal: Negative for back pain and neck pain.  Skin: Negative for rash.  Neurological: Positive for light-headedness. Negative for speech difficulty, numbness and headaches.  Hematological: Does not bruise/bleed easily.  Psychiatric/Behavioral: Negative for confusion.    Physical Exam Updated Vital Signs Pulse 96   Temp 97.8 F (36.6 C) (Temporal)   Resp (!) 24   Ht 1.651 m (5\' 5" )   Wt 70 kg   SpO2 96%   BMI 25.68 kg/m   Physical Exam Vitals and nursing note reviewed.  Constitutional:      Appearance: Normal appearance. She is well-developed.  HENT:     Head: Atraumatic.     Nose: Nose normal.     Mouth/Throat:     Mouth: Mucous membranes are moist.  Eyes:     General: No scleral icterus.    Conjunctiva/sclera: Conjunctivae normal.     Pupils: Pupils are equal, round, and reactive to light.  Neck:     Vascular: No carotid bruit.     Trachea: No tracheal deviation.     Comments: Thyroid not grossly enlarged or tender. No bruits.  Cardiovascular:     Rate and Rhythm: Tachycardia present. Rhythm irregular.     Pulses: Normal pulses.     Heart sounds: Normal heart sounds. No murmur. No friction rub. No gallop.   Pulmonary:     Effort: Pulmonary effort is normal. No respiratory distress.     Breath  sounds: Normal breath sounds.  Abdominal:     General: Bowel sounds are normal. There is no distension.     Palpations: Abdomen is soft. There is no mass.     Tenderness: There is no abdominal tenderness. There is no guarding or rebound.     Hernia: No hernia is present.  Genitourinary:    Comments: No cva tenderness.  Musculoskeletal:        General: No swelling or tenderness.     Cervical back: Normal range of motion and neck supple. No rigidity. No muscular tenderness.  Right lower leg: No edema.     Left lower leg: No edema.  Skin:    General: Skin is warm and dry.     Findings: No rash.  Neurological:     Mental Status: She is alert.     Comments: Alert, speech normal. Motor/sens grossly intact bil.   Psychiatric:        Mood and Affect: Mood normal.      ED Results / Procedures / Treatments   Labs (all labs ordered are listed, but only abnormal results are displayed) Results for orders placed or performed during the hospital encounter of 01/12/20  CBC  Result Value Ref Range   WBC 6.1 4.0 - 10.5 K/uL   RBC 4.21 3.87 - 5.11 MIL/uL   Hemoglobin 13.4 12.0 - 15.0 g/dL   HCT 41.1 36.0 - 46.0 %   MCV 97.6 80.0 - 100.0 fL   MCH 31.8 26.0 - 34.0 pg   MCHC 32.6 30.0 - 36.0 g/dL   RDW 13.2 11.5 - 15.5 %   Platelets 259 150 - 400 K/uL   nRBC 0.0 0.0 - 0.2 %  Comprehensive metabolic panel  Result Value Ref Range   Sodium 142 135 - 145 mmol/L   Potassium 3.6 3.5 - 5.1 mmol/L   Chloride 107 98 - 111 mmol/L   CO2 22 22 - 32 mmol/L   Glucose, Bld 145 (H) 70 - 99 mg/dL   BUN 17 8 - 23 mg/dL   Creatinine, Ser 1.42 (H) 0.44 - 1.00 mg/dL   Calcium 9.9 8.9 - 10.3 mg/dL   Total Protein 6.7 6.5 - 8.1 g/dL   Albumin 3.8 3.5 - 5.0 g/dL   AST 21 15 - 41 U/L   ALT 16 0 - 44 U/L   Alkaline Phosphatase 38 38 - 126 U/L   Total Bilirubin 0.6 0.3 - 1.2 mg/dL   GFR calc non Af Amer 37 (L) >60 mL/min   GFR calc Af Amer 43 (L) >60 mL/min   Anion gap 13 5 - 15  Lactic acid, plasma    Result Value Ref Range   Lactic Acid, Venous 2.9 (HH) 0.5 - 1.9 mmol/L  CBG monitoring, ED  Result Value Ref Range   Glucose-Capillary 120 (H) 70 - 99 mg/dL   Comment 1 Notify RN    Comment 2 Document in Chart   Troponin I (High Sensitivity)  Result Value Ref Range   Troponin I (High Sensitivity) 36 (H) <18 ng/L   DG Chest Port 1 View  Result Date: 01/12/2020 CLINICAL DATA:  Weakness EXAM: PORTABLE CHEST 1 VIEW COMPARISON:  05/17/2019 FINDINGS: Stable mild cardiomegaly. Atherosclerotic calcification of the aortic knob. No focal airspace consolidation, pleural effusion, or pneumothorax. IMPRESSION: No active disease. Electronically Signed   By: Davina Poke D.O.   On: 01/12/2020 17:32    EKG EKG Interpretation  Date/Time:  Sunday January 12 2020 16:57:03 EDT Ventricular Rate:  125 PR Interval:    QRS Duration: 126 QT Interval:  361 QTC Calculation: 521 R Axis:   -76 Text Interpretation: Atrial flutter with 2:1 AV block RBBB and LAFB Probable left ventricular hypertrophy Non-specific ST-t changes Confirmed by Lajean Saver [phone]) on 01/12/2020 4:59:04 PM   Radiology DG Chest Port 1 View  Result Date: 01/12/2020 CLINICAL DATA:  Weakness EXAM: PORTABLE CHEST 1 VIEW COMPARISON:  05/17/2019 FINDINGS: Stable mild cardiomegaly. Atherosclerotic calcification of the aortic knob. No focal airspace consolidation, pleural effusion, or pneumothorax. IMPRESSION: No active disease. Electronically Signed  By: Davina Poke D.O.   On: 01/12/2020 17:32    Procedures Procedures (including critical care time)  Medications Ordered in ED Medications  sodium chloride 0.9 % bolus 1,000 mL (1,000 mLs Intravenous New Bag/Given 01/12/20 1723)    ED Course  I have reviewed the triage vital signs and the nursing notes.  Pertinent labs & imaging results that were available during my care of the patient were reviewed by me and considered in my medical decision making (see chart for details).     MDM Rules/Calculators/A&P                     Initially in triage, unable to get bp.   Immediately moved to room. Continuous pulse ox and monitor. Stat labs. Iv ns bolus. Ecg. Imaging.   Reviewed nursing notes and prior charts for additional history.   Patient presents w near syncope, and low bp - initial diff dx is very board, including acute blood loss, cardiac dysrhythmia, acs, infection/sepsis - these conditions carry significant risk, morbidity and mortality.  MDM Number of Diagnoses or Management Options   Amount and/or Complexity of Data Reviewed Clinical lab tests: ordered and reviewed Tests in the radiology section of CPT: ordered and reviewed Tests in the medicine section of CPT: ordered and reviewed Decide to obtain previous medical records or to obtain history from someone other than the patient: yes Obtain history from someone other than the patient: yes Review and summarize past medical records: yes Independent visualization of images, tracings, or specimens: yes  Risk of Complications, Morbidity, and/or Mortality Presenting problems: high Diagnostic procedures: high Management options: high   Initial labs reviewed/interpreted by me - wbc normal, hgb normal.   CXR reviewed/interpreted by me - no pna  This patients CHA2DS2-VASc Score and unadjusted Ischemic Stroke Rate (% per year) is equal to 3.2 % stroke rate/year from a score of 3  Above score calculated as 1 point each if present [CHF, HTN, DM, Vascular=MI/PAD/Aortic Plaque, Age if 65-74, or Female] Above score calculated as 2 points each if present [Age > 75, or Stroke/TIA/TE]  Additional labs reviewed/interpreted by me - lactate elev. Mild aki. ?volume depletion - pt notes relatively poor po intake today. Iv ns boluses. Pt denies fever or chills. Denies cough or uri symptoms. No dysuria or gu c/o.   With NS bolus, bp improved. cardizem bolus/gtt.   Cardiology consulted - discussed pt, symptoms, ecg, etc  - cardiologist indicates he will see/admit, rec against ED cardioversion as pt did not have clear sense of acute onset palpitations.   HR improved. Recheck pt, no cp or discomfort. Continues to deny any fever or chills.   covid test added to workup .  CRITICAL CARE  RE: presyncope/hypotension, new onset afib with rapid ventricular response, chest discomfort, elevated troponin.  Performed by: Mirna Mires Total critical care time: 110 minutes Critical care time was exclusive of separately billable procedures and treating other patients. Critical care was necessary to treat or prevent imminent or life-threatening deterioration. Critical care was time spent personally by me on the following activities: development of treatment plan with patient and/or surrogate as well as nursing, discussions with consultants, evaluation of patient's response to treatment, examination of patient, obtaining history from patient or surrogate, ordering and performing treatments and interventions, ordering and review of laboratory studies, ordering and review of radiographic studies, pulse oximetry and re-evaluation of patient's condition.    Final Clinical Impression(s) / ED Diagnoses Final diagnoses:  None    Rx / DC Orders ED Discharge Orders    None       Lajean Saver, MD 01/12/20 [phone]

## 2020-01-13 ENCOUNTER — Inpatient Hospital Stay (HOSPITAL_BASED_OUTPATIENT_CLINIC_OR_DEPARTMENT_OTHER): Payer: Medicare HMO

## 2020-01-13 ENCOUNTER — Other Ambulatory Visit: Payer: Self-pay

## 2020-01-13 DIAGNOSIS — E872 Acidosis, unspecified: Secondary | ICD-10-CM

## 2020-01-13 DIAGNOSIS — I361 Nonrheumatic tricuspid (valve) insufficiency: Secondary | ICD-10-CM | POA: Diagnosis not present

## 2020-01-13 DIAGNOSIS — I248 Other forms of acute ischemic heart disease: Secondary | ICD-10-CM

## 2020-01-13 DIAGNOSIS — I480 Paroxysmal atrial fibrillation: Secondary | ICD-10-CM | POA: Diagnosis not present

## 2020-01-13 DIAGNOSIS — R778 Other specified abnormalities of plasma proteins: Secondary | ICD-10-CM

## 2020-01-13 DIAGNOSIS — N179 Acute kidney failure, unspecified: Secondary | ICD-10-CM

## 2020-01-13 DIAGNOSIS — I340 Nonrheumatic mitral (valve) insufficiency: Secondary | ICD-10-CM | POA: Diagnosis not present

## 2020-01-13 LAB — ECHOCARDIOGRAM COMPLETE
Height: 65 in
Weight: 2491.2 oz

## 2020-01-13 LAB — URINALYSIS, ROUTINE W REFLEX MICROSCOPIC
Bacteria, UA: NONE SEEN
Bilirubin Urine: NEGATIVE
Glucose, UA: NEGATIVE mg/dL
Hgb urine dipstick: NEGATIVE
Ketones, ur: NEGATIVE mg/dL
Leukocytes,Ua: NEGATIVE
Nitrite: POSITIVE — AB
Protein, ur: NEGATIVE mg/dL
Specific Gravity, Urine: 1.024 (ref 1.005–1.030)
pH: 6 (ref 5.0–8.0)

## 2020-01-13 LAB — CBC
HCT: 35.4 % — ABNORMAL LOW (ref 36.0–46.0)
Hemoglobin: 11.3 g/dL — ABNORMAL LOW (ref 12.0–15.0)
MCH: 31 pg (ref 26.0–34.0)
MCHC: 31.9 g/dL (ref 30.0–36.0)
MCV: 97 fL (ref 80.0–100.0)
Platelets: 211 10*3/uL (ref 150–400)
RBC: 3.65 MIL/uL — ABNORMAL LOW (ref 3.87–5.11)
RDW: 13.3 % (ref 11.5–15.5)
WBC: 8.2 10*3/uL (ref 4.0–10.5)
nRBC: 0 % (ref 0.0–0.2)

## 2020-01-13 LAB — LIPID PANEL
Cholesterol: 115 mg/dL (ref 0–200)
HDL: 31 mg/dL — ABNORMAL LOW (ref >40)
LDL Cholesterol: 66 mg/dL (ref 0–99)
Total CHOL/HDL Ratio: 3.7 RATIO
Triglycerides: 90 mg/dL (ref <150)
VLDL: 18 mg/dL (ref 0–40)

## 2020-01-13 LAB — BASIC METABOLIC PANEL
Anion gap: 9 (ref 5–15)
BUN: 19 mg/dL (ref 8–23)
CO2: 24 mmol/L (ref 22–32)
Calcium: 8.9 mg/dL (ref 8.9–10.3)
Chloride: 109 mmol/L (ref 98–111)
Creatinine, Ser: 0.99 mg/dL (ref 0.44–1.00)
GFR calc Af Amer: >60 mL/min (ref >60)
GFR calc non Af Amer: 57 mL/min — ABNORMAL LOW (ref >60)
Glucose, Bld: 85 mg/dL (ref 70–99)
Potassium: 4.1 mmol/L (ref 3.5–5.1)
Sodium: 142 mmol/L (ref 135–145)

## 2020-01-13 LAB — TROPONIN I (HIGH SENSITIVITY)
Troponin I (High Sensitivity): 532 ng/L (ref <18)
Troponin I (High Sensitivity): 447 ng/L (ref <18)
Troponin I (High Sensitivity): 519 ng/L (ref <18)

## 2020-01-13 MED ORDER — METOPROLOL SUCCINATE ER 25 MG PO TB24: 25.0000 mg | ORAL_TABLET | Freq: Every day | ORAL | 6 refills | Status: DC

## 2020-01-13 MED ORDER — ASPIRIN 81 MG PO CHEW: 81.0000 mg | CHEWABLE_TABLET | Freq: Every day | ORAL | 3 refills | Status: DC

## 2020-01-13 MED ORDER — OFF THE BEAT BOOK
Freq: Once | Status: AC
  Filled 2020-01-13: qty 1

## 2020-01-13 MED ORDER — ASPIRIN 81 MG PO CHEW
81.0000 mg | CHEWABLE_TABLET | Freq: Once | ORAL | Status: AC
  Administered 2020-01-13: 81 mg via ORAL
  Filled 2020-01-13: qty 1

## 2020-01-13 MED ORDER — ASPIRIN 81 MG PO CHEW: 81.0000 mg | CHEWABLE_TABLET | Freq: Every day | ORAL | Status: DC

## 2020-01-13 MED ORDER — APIXABAN 5 MG PO TABS: 5.0000 mg | ORAL_TABLET | Freq: Two times a day (BID) | ORAL | 3 refills | Status: DC

## 2020-01-13 MED ORDER — METOPROLOL TARTRATE 25 MG PO TABS
25.0000 mg | ORAL_TABLET | Freq: Two times a day (BID) | ORAL | Status: DC
  Administered 2020-01-13: 25 mg via ORAL
  Filled 2020-01-13: qty 1

## 2020-01-13 MED FILL — ELIQUIS 5 MG TABLET: 5 | 30 days supply | Qty: 60 | Fill #0

## 2020-01-13 MED FILL — METOPROLOL SUCCINATE ER 25: 25 | 30 days supply | Qty: 30 | Fill #0

## 2020-01-13 MED FILL — ASPIRIN LOW DOSE 81 MG CHEW: 81 | 90 days supply | Qty: 90 | Fill #0

## 2020-01-13 NOTE — Care Management CC44 (Signed)
Condition Code 44 Documentation Completed  Patient Details  Name: Tonya [name] MRN: [id number] Date of Birth: 1945/10/27   Condition Code 44 given:  Yes Patient signature on Condition Code 44 notice:  Yes Documentation of 2 MD's agreement:  Yes Code 44 added to claim:  Yes    Dahlia Client Romeo Rabon, RN 01/13/2020, 4:12 PM

## 2020-01-13 NOTE — Progress Notes (Signed)
  Echocardiogram 2D Echocardiogram has been performed.  Geoffery Lyons Swaim 01/13/2020, 3:30 PM

## 2020-01-13 NOTE — Progress Notes (Addendum)
Cardiology Progress Note  Patient ID: Tonya [name] MRN: [id number] DOB: October 04, 1945 Date of Encounter: 01/13/2020  Primary Cardiologist: Evalina Field, MD  Subjective  Converted to normal sinus rhythm overnight.  No complaints this morning.  Reports she felt palpitations most of the day yesterday.  No fever or readily identifiable trigger.  Possibly dehydration and stress as her sister has dementia and recently diagnosed with pneumonia.  ROS:  All other ROS reviewed and negative. Pertinent positives noted in the HPI.     Inpatient Medications  Scheduled Meds: . apixaban  5 mg Oral BID  . cycloSPORINE  1 drop Both Eyes BID  . metoprolol tartrate  25 mg Oral BID   Continuous Infusions:  PRN Meds: acetaminophen, ondansetron (ZOFRAN) IV   Vital Signs   Vitals:   01/12/20 2153 01/13/20 0440 01/13/20 0839 01/13/20 0844  BP: (!) 128/54 126/67 125/71   Pulse: 63 61 68 66  Resp: 19 19 18    Temp: 98.8 F (37.1 C) 98.5 F (36.9 C)  98.2 F (36.8 C)  TempSrc: Oral Oral  Oral  SpO2: 100% 96%  95%  Weight:  70.6 kg    Height:        Intake/Output Summary (Last 24 hours) at 01/13/2020 0919 Last data filed at 01/13/2020 0000 Gross per 24 hour  Intake 2120 ml  Output --  Net 2120 ml   Last 3 Weights 01/13/2020 01/12/2020 01/12/2020  Weight (lbs) 155 lb 11.2 oz 157 lb 6.4 oz 154 lb 5.2 oz  Weight (kg) 70.625 kg 71.396 kg 70 kg      Telemetry  Overnight telemetry shows normal sinus rhythm, which I personally reviewed.   ECG  The most recent ECG shows normal sinus rhythm heart rate 62, right bundle branch block, left anterior fascicular block, PR interval 200 ms, which I personally reviewed.   Physical Exam   Vitals:   01/12/20 2153 01/13/20 0440 01/13/20 0839 01/13/20 0844  BP: (!) 128/54 126/67 125/71   Pulse: 63 61 68 66  Resp: 19 19 18    Temp: 98.8 F (37.1 C) 98.5 F (36.9 C)  98.2 F (36.8 C)  TempSrc: Oral Oral  Oral  SpO2: 100% 96%  95%  Weight:  70.6 kg     Height:         Intake/Output Summary (Last 24 hours) at 01/13/2020 0919 Last data filed at 01/13/2020 0000 Gross per 24 hour  Intake 2120 ml  Output --  Net 2120 ml    Last 3 Weights 01/13/2020 01/12/2020 01/12/2020  Weight (lbs) 155 lb 11.2 oz 157 lb 6.4 oz 154 lb 5.2 oz  Weight (kg) 70.625 kg 71.396 kg 70 kg    Body mass index is 25.91 kg/m.   General: Well nourished, well developed, in no acute distress Head: Atraumatic, normal size  Eyes: PEERLA, EOMI  Neck: Supple, no JVD Endocrine: No thryomegaly Cardiac: Normal S1, S2; RRR; no murmurs, rubs, or gallops Lungs: Clear to auscultation bilaterally, no wheezing, rhonchi or rales  Abd: Soft, nontender, no hepatomegaly  Ext: No edema, pulses 2+ Musculoskeletal: No deformities, BUE and BLE strength normal and equal Skin: Warm and dry, no rashes   Neuro: Alert and oriented to person, place, time, and situation, CNII-XII grossly intact, no focal deficits  Psych: Normal mood and affect   Labs  High Sensitivity Troponin:   Recent Labs  Lab 01/12/20 1706 01/12/20 2024  TROPONINIHS 36* 177*     Cardiac EnzymesNo results for input(s): TROPONINI  in the last 168 hours. No results for input(s): TROPIPOC in the last 168 hours.  Chemistry Recent Labs  Lab 01/12/20 1706 01/13/20 0426  NA 142 142  K 3.6 4.1  CL 107 109  CO2 22 24  GLUCOSE 145* 85  BUN 17 19  CREATININE 1.42* 0.99  CALCIUM 9.9 8.9  PROT 6.7  --   ALBUMIN 3.8  --   AST 21  --   ALT 16  --   ALKPHOS 38  --   BILITOT 0.6  --   GFRNONAA 37* 57*  GFRAA 43* >60  ANIONGAP 13 9    Hematology Recent Labs  Lab 01/12/20 1706 01/13/20 0426  WBC 6.1 8.2  RBC 4.21 3.65*  HGB 13.4 11.3*  HCT 41.1 35.4*  MCV 97.6 97.0  MCH 31.8 31.0  MCHC 32.6 31.9  RDW 13.2 13.3  PLT 259 211   BNPNo results for input(s): BNP, PROBNP in the last 168 hours.  DDimer No results for input(s): DDIMER in the last 168 hours.   Radiology  DG Chest Port 1 View  Result Date:  01/12/2020 CLINICAL DATA:  Weakness EXAM: PORTABLE CHEST 1 VIEW COMPARISON:  05/17/2019 FINDINGS: Stable mild cardiomegaly. Atherosclerotic calcification of the aortic knob. No focal airspace consolidation, pleural effusion, or pneumothorax. IMPRESSION: No active disease. Electronically Signed   By: Davina Poke D.O.   On: 01/12/2020 17:32    Cardiac Studies  CCTA 07/09/2019 1. Coronary calcium score of 1022. This was 97th percentile for age and sex matched control.  2. Normal coronary origin with right dominance.  3. Mild calcified CAD in the LAD and LCX (25-49%) that is non-obstructive.  4. Likely mild calcified CAD in the mRCA that is non-obstructive but cannot exclude a moderate stenosis.  CT FFR 09/02/2019 1. Left Main: No significant stenosis.  2. LAD: 0.95; no significant stenosis. 3. LCX: 0.85; no significant stenosis. 4. RCA: 0.93; no significant stenosis.  IMPRESSION: 1.  CT FFR analysis didn't show any significant stenosis.  Patient Profile  Tonya [name] is a 74 y.o. female with hypertension, hyperlipidemia, tobacco abuse who was admitted on 01/12/2020 for new onset atrial fibrillation with RVR.  Assessment & Plan   1.  New onset A. fib with RVR -She was clearly dehydrated and has significant stress in her life with her sister being diagnosed with dementia.  This could have set off her atrial fibrillation.  She has converted back to normal sinus rhythm with diltiazem. -thyroid studies normal -CXR without signs of infection  -no volume on exam today -will transition to metoprolol succinate 25 mg QD -CHADSVASC = 4; eliquis 5 mg BID -troponin mildly elevated and likely demand in setting of known non-obstructive CAD; repeat troponin pending -no CP -echo pending -likely DC pending echo cardiogram -needs outpatient sleep study  2. Lactic acidosis/AKI -dehydration vs Afib with RVR -resolved -Cr bump on admission, resolved with fluids. 2/2 afib with RVR  and dehydration   3. Elevated troponin, likely demand -no symptoms of CP to suggest ACS -likely demand in setting of known non-obstructive CAD -related to Afib with RVR -repeat troponin pending -echo pending   4. HTN -metoprolol as above  5. Tobacco abuse -smoking cessation counseling provided  6. Non-obstructive CAD -I recommended statin but she has declined -will continue ASA 81 mg daily  FEN -no IVF -diet: general -dvt ppx: eliquis -code: full   For questions or updates, please contact [place] Please consult [email or web address] for contact info under  Time Spent with Patient: I have spent a total of 35 minutes with patient reviewing hospital notes, telemetry, EKGs, labs and examining the patient as well as establishing an assessment and plan that was discussed with the patient.  > 50% of time was spent in direct patient care.    Signed, Addison Naegeli. Audie Box, [place]  01/13/2020 9:19 AM

## 2020-01-13 NOTE — Care Management Obs Status (Signed)
[place] NOTIFICATION   Patient Details  Name: CALISSA REISLER MRN: [id number] Date of Birth: 1946/01/21   Medicare Observation Status Notification Given:  Yes    Dawayne Patricia, RN 01/13/2020, 4:12 PM

## 2020-01-13 NOTE — Discharge Summary (Signed)
Discharge Summary    Patient ID: SOFIYAH [name] MRN: [id number]; DOB: 10/19/1945  Admit date: 01/12/2020 Discharge date: 01/13/2020  Primary Care Provider: Alroy Dust, L.Marlou Sa, MD  Primary Cardiologist: Evalina Field, MD  Primary Electrophysiologist:  None   Discharge Diagnoses    Principal Problem:   Atrial fibrillation [facility]) Active Problems:   Essential hypertension, benign   Tobacco use disorder   Lactic acidosis   Elevated troponin   Demand ischemia ([place])   AKI (acute kidney injury) [facility])   Diagnostic Studies/Procedures    Echo 01/13/20 Unofficial read: 50-55% _____________   History of Present Illness     Tonya [name] is a 74 y.o. female with pmh of HTN, HLD and smoking presented to the ED for weakness and palpitations. Symptoms began earlier that day. They were new for her and patient had not felt like that before. She was feeling a strange sensation in her head and neck. She did notice some dizziness and lightheadedness as well. She denied chest pain, sob, syncope, fever, chills, nausea, vomiting, diarrhea, leg swelling. She recently had been under a lot of stress since started caring for her sister who has dementia. Appetite had been low due to her high stress levels.   Hospital Course     Consultants: None  In the ED she was found to be in Afib RVR. Her systolics were in the 0000000 and heart rate was 110-120s. HS troponin was minimally elevated and lactate at 2.9. She was given IVF and started on a diltiazem drip. CHADSVASC 4 (HTN, Age, female, CAD) and patient was started on Eliquis 5 mg BID. Hs troponin noted to increase 36 >177> 532>447. Prior ischemic eval coronary CT showed normal coronaries. Elevation thought to be demand ischemia in the setting of new onset Afib RVR.Daily aspirin was started. Patient converted to NSR spontaneously overnight. Afib trigger suspected to be stress and decreased oral intake. Creatinine improved with IVF and lactic acid came down  as well to 1.2. Metoprolol succinate 25 mg daily was added. Echo ordered, unofficial read by Dr. Audie Box with EF 50-55%. Will plan to discharge patient with Eliquis 5 mg BID, Aspirin 81 mg daily and Metoprolol succinate 25 mg daily.   The patient was evaluated by Dr. Audie Box 01/13/20 and felt to be stable for discharge.   Did the patient have an acute coronary syndrome (MI, NSTEMI, STEMI, etc) this admission?:  No                               Did the patient have a percutaneous coronary intervention (stent / angioplasty)?:  No.   _____________  Discharge Vitals Blood pressure (!) 103/46, pulse (!) 52, temperature 97.9 F (36.6 C), temperature source Oral, resp. rate 18, height 5\' 5"  (1.651 m), weight 70.6 kg, SpO2 97 %.  Filed Weights   01/12/20 1654 01/12/20 2142 01/13/20 0440  Weight: 70 kg 71.4 kg 70.6 kg    Labs & Radiologic Studies    CBC Recent Labs    01/12/20 1706 01/13/20 0426  WBC 6.1 8.2  HGB 13.4 11.3*  HCT 41.1 35.4*  MCV 97.6 97.0  PLT 259 123456   Basic Metabolic Panel Recent Labs    01/12/20 1706 01/13/20 0426  NA 142 142  K 3.6 4.1  CL 107 109  CO2 22 24  GLUCOSE 145* 85  BUN 17 19  CREATININE 1.42* 0.99  CALCIUM 9.9 8.9   Liver Function  Tests Recent Labs    01/12/20 1706  AST 21  ALT 16  ALKPHOS 38  BILITOT 0.6  PROT 6.7  ALBUMIN 3.8   No results for input(s): LIPASE, AMYLASE in the last 72 hours. High Sensitivity Troponin:   Recent Labs  Lab 01/12/20 1706 01/12/20 2024 01/13/20 0925 01/13/20 1223  TROPONINIHS 36* 177* 532* 447*    BNP Invalid input(s): POCBNP D-Dimer No results for input(s): DDIMER in the last 72 hours. Hemoglobin A1C No results for input(s): HGBA1C in the last 72 hours. Fasting Lipid Panel Recent Labs    01/13/20 0426  CHOL 115  HDL 31*  LDLCALC 66  TRIG 90  CHOLHDL 3.7   Thyroid Function Tests Recent Labs    01/12/20 2024  TSH 0.526   _____________  DG Chest Port 1 View  Result Date:  01/12/2020 CLINICAL DATA:  Weakness EXAM: PORTABLE CHEST 1 VIEW COMPARISON:  05/17/2019 FINDINGS: Stable mild cardiomegaly. Atherosclerotic calcification of the aortic knob. No focal airspace consolidation, pleural effusion, or pneumothorax. IMPRESSION: No active disease. Electronically Signed   By: Davina Poke D.O.   On: 01/12/2020 17:32   Disposition   Pt is being discharged home today in good condition.  Follow-up Plans & Appointments    Follow-up Information    Almyra Deforest, [place] Follow up on 01/30/2020.   Specialties: Cardiology, Radiology Why: Hospital follow-up 4/29 at 10:45 AM Contact information: [address] [place] [address] [phone]          Discharge Instructions    Diet - low sodium heart healthy   Complete by: As directed    Increase activity slowly   Complete by: As directed       Discharge Medications   Allergies as of 01/13/2020      Reactions   Penicillins Rash      Medication List    STOP taking these medications   meloxicam 15 MG tablet Commonly known as: MOBIC   metoprolol tartrate 50 MG tablet Commonly known as: LOPRESSOR   valsartan-hydrochlorothiazide 320-12.5 MG tablet Commonly known as: DIOVAN-HCT     TAKE these medications   apixaban 5 MG Tabs tablet Commonly known as: ELIQUIS Take 1 tablet (5 mg total) by mouth 2 (two) times daily.   aspirin 81 MG chewable tablet Chew 1 tablet (81 mg total) by mouth daily. Start taking on: January 14, 2020   buPROPion 150 MG 24 hr tablet Commonly known as: WELLBUTRIN XL TAKE 1 TABLET (150 MG TOTAL) BY MOUTH DAILY.   cetirizine 10 MG tablet Commonly known as: ZYRTEC Take 1 tablet (10 mg total) by mouth daily.   cyclobenzaprine 5 MG tablet Commonly known as: FLEXERIL Take 1 tablet (5 mg total) by mouth 3 (three) times daily as needed for muscle spasms.   fluticasone 50 MCG/ACT nasal spray Commonly known as: FLONASE Place 2 sprays into both nostrils daily. What  changed:   when to take this  reasons to take this   latanoprost 0.005 % ophthalmic solution Commonly known as: XALATAN Place 1 drop into both eyes at bedtime.   losartan-hydrochlorothiazide 100-12.5 MG tablet Commonly known as: HYZAAR Take 1 tablet by mouth daily.   metoprolol succinate 25 MG 24 hr tablet Commonly known as: Toprol XL Take 1 tablet (25 mg total) by mouth daily.   Restasis 0.05 % ophthalmic emulsion Generic drug: cycloSPORINE Place 1 drop into both eyes 2 (two) times daily.   VITAMIN D3 COMPLETE PO Take 2 tablets by mouth daily.  Taking 2-3 tablets daily          Outstanding Labs/Studies   None  Duration of Discharge Encounter   Greater than 30 minutes including physician time.  Signed, Deloras Reichard Ninfa Meeker, PA-C 01/13/2020, 4:03 PM

## 2020-01-13 NOTE — TOC Benefit Eligibility Note (Signed)
Transition of Care [facility]) Benefit Eligibility Note    Patient Details  Name: Tonya [name] MRN: [id number] Date of Birth: 1945/10/13   Medication/Dose: Eliquis 5mg  BID  Covered?: Yes  Tier: (Not available)  Prescription Coverage [facility]erred [facility] and [place] with Person/Company/Phone Number:: Nevin Bloodgood / [facility] [employer]? [phone]  Co-Pay: 30.00 for a 30 day supply  Prior Approval: No  Deductible: (Unavailable)       Orbie Pyo Phone Number: 01/13/2020, 2:23 PM

## 2020-01-13 NOTE — Progress Notes (Signed)
Per pt report, she has had both doses of covid 19 vaccine by [employer].

## 2020-01-14 LAB — HEMOGLOBIN A1C
Hgb A1c MFr Bld: 6 % — ABNORMAL HIGH (ref 4.8–5.6)
Mean Plasma Glucose: 126 mg/dL

## 2020-01-30 ENCOUNTER — Ambulatory Visit: Payer: Medicare HMO | Admitting: Physician Assistant

## 2020-02-02 NOTE — Progress Notes (Signed)
Cardiology Office Note:   Date:  02/04/2020  NAME:  Tonya [name]    MRN: [id number] DOB:  Mar 05, 1946   PCP:  Alroy Dust, L.Marlou Sa, MD  Cardiologist:  Evalina Field, MD   Referring MD: Aurea Graff.Marlou Sa, MD   Chief Complaint  Patient presents with  . Atrial Fibrillation   History of Present Illness:   Tonya [name] is a 74 y.o. female with a hx of non-obstructive CAD, afib, HTN, tobacco abuse who presents for follow-up. Admitted 01/13/2020 for new onset Afib with RVR in setting of dehydration/lactic acidosis. Converted back to NSR on diltiazem. Had mild troponin elevation up to 532 but EF 50-55% without WMA. No CP to suggest ACS and recent CCTA with no significant obstructive CAD in large vessels.   She reports has been doing well since leaving the hospital.  EKG today confirms she is still in normal sinus rhythm.  She is not exercising routinely but has no limitations with getting around town or doing activities at home.  She describes no chest pain or shortness of breath.  She is cut down smoking and is only taking 3 cigarettes/day.  I did go over her recent cardiac CTA with her and recommended a statin once again.  She has been reluctant to start one.  Today she is agreeable to starting Crestor today.  She is having no bleeding issues with Eliquis.  No recurrence of her atrial fibrillation.  I have encouraged her to remain well-hydrated.  When she was in the hospital she was dehydrated with an elevated lactic acid which could have triggered her atrial fibrillation.  She will work on this moving forward.  Blood pressure 130/83 today.  She denies symptoms.  Problem List 1. Atrial fibrillation, paroxysmal  -diagnosed 01/13/2020 -mild troponin elevation due to small vessel dz -CHADSVASC=4 2. Non-obstructive CAD -CCTA with mild to moderate CAD -negative FFR  -CAC score 1022 -declined statin 3. HTN 4. Tobacco abuse  5. HLD -T chol 113, HDL 31, LDL 66, TG 90  Past Medical History: Past  Medical History:  Diagnosis Date  . Allergy   . Hyperlipidemia   . Hypertension   . [name] disease    per her report from skin biopsy prior  . Osteoarthritis    knee, hips  . Plantar fasciitis    right foot  . Tobacco use disorder     Past Surgical History: Past Surgical History:  Procedure Laterality Date  . ABDOMINAL HYSTERECTOMY     partial  . CHOLECYSTECTOMY  2003  . COLONOSCOPY  2010  . WISDOM TOOTH EXTRACTION      Current Medications: Current Meds  Medication Sig  . apixaban (ELIQUIS) 5 MG TABS tablet Take 1 tablet (5 mg total) by mouth 2 (two) times daily.  Marland Kitchen aspirin 81 MG chewable tablet Chew 1 tablet (81 mg total) by mouth daily.  . cetirizine (ZYRTEC) 10 MG tablet Take 1 tablet (10 mg total) by mouth daily.  . cyclobenzaprine (FLEXERIL) 5 MG tablet Take 1 tablet (5 mg total) by mouth 3 (three) times daily as needed for muscle spasms.  . cycloSPORINE (RESTASIS) 0.05 % ophthalmic emulsion Place 1 drop into both eyes 2 (two) times daily.   . fluticasone (FLONASE) 50 MCG/ACT nasal spray Place 2 sprays into both nostrils daily. (Patient taking differently: Place 2 sprays into both nostrils daily as needed for allergies or rhinitis. )  . latanoprost (XALATAN) 0.005 % ophthalmic solution Place 1 drop into both eyes at bedtime.  Marland Kitchen  metoprolol succinate (TOPROL XL) 25 MG 24 hr tablet Take 1 tablet (25 mg total) by mouth daily.  . Multiple Vitamins-Minerals (VITAMIN D3 COMPLETE PO) Take 2 tablets by mouth daily. Taking 2-3 tablets daily   . [DISCONTINUED] apixaban (ELIQUIS) 5 MG TABS tablet Take 1 tablet (5 mg total) by mouth 2 (two) times daily.  . [DISCONTINUED] losartan-hydrochlorothiazide (HYZAAR) 100-12.5 MG tablet Take 1 tablet by mouth daily.  . [DISCONTINUED] metoprolol succinate (TOPROL XL) 25 MG 24 hr tablet Take 1 tablet (25 mg total) by mouth daily.     Allergies:    Penicillins   Social History: Social History   Socioeconomic History  . Marital  status: Single    Spouse name: Not on file  . Number of children: Not on file  . Years of education: Not on file  . Highest education level: Not on file  Occupational History  . Not on file  Tobacco Use  . Smoking status: Current Every Day Smoker    Packs/day: 1.00    Years: 50.00    Pack years: 50.00    Types: Cigarettes  . Smokeless tobacco: Never Used  Substance and Sexual Activity  . Alcohol use: Not Currently    Alcohol/week: 0.0 standard drinks    Comment: occ  . Drug use: No  . Sexual activity: Never  Other Topics Concern  . Not on file  Social History Narrative  . Not on file   Social Determinants of Health   Financial Resource Strain:   . Difficulty of Paying Living Expenses:   Food Insecurity:   . Worried About Charity fundraiser in the Last Year:   . Arboriculturist in the Last Year:   Transportation Needs:   . Film/video editor (Medical):   Marland Kitchen Lack of Transportation (Non-Medical):   Physical Activity:   . Days of Exercise per Week:   . Minutes of Exercise per Session:   Stress:   . Feeling of Stress :   Social Connections:   . Frequency of Communication with Friends and Family:   . Frequency of Social Gatherings with Friends and Family:   . Attends Religious Services:   . Active Member of Clubs or Organizations:   . Attends Archivist Meetings:   Marland Kitchen Marital Status:      Family History: The patient's family history includes Heart disease in her father; Hypertension in her mother; Liver disease in her brother; Other in her brother; Pneumonia in her brother; Stroke in her mother.  ROS:   All other ROS reviewed and negative. Pertinent positives noted in the HPI.     EKGs/Labs/Other Studies Reviewed:   The following studies were personally reviewed by me today:  EKG:  EKG is ordered today.  The ekg ordered today demonstrates sinus bradycardia, heart rate 58, right bundle branch block, left anterior fascicular block, and was personally  reviewed by me.   CTA 07/09/2019 IMPRESSION: 1. Coronary calcium score of 1022. This was 97th percentile for age and sex matched control.  2. Normal coronary origin with right dominance.  3. Mild calcified CAD in the LAD and LCX (25-49%) that is non-obstructive.  4. Likely mild calcified CAD in the mRCA that is non-obstructive but cannot exclude a moderate stenosis.  CT FFR 07/09/2019 1. Left Main: No significant stenosis.  2. LAD: 0.95; no significant stenosis. 3. LCX: 0.85; no significant stenosis. 4. RCA: 0.93; no significant stenosis.  IMPRESSION: 1.  CT FFR analysis didn't show any  significant stenosis.  TTE 01/13/2020 1. Left ventricular ejection fraction, by estimation, is 50 to 55%. The  left ventricle has low normal function. The left ventricle has no regional  wall motion abnormalities. Left ventricular diastolic parameters are  consistent with Grade I diastolic  dysfunction (impaired relaxation).  2. Right ventricular systolic function is normal. The right ventricular  size is normal. There is moderately elevated pulmonary artery systolic  pressure.  3. Left atrial size was mildly dilated.  4. The mitral valve is normal in structure. Mild mitral valve  regurgitation. No evidence of mitral stenosis.  5. Tricuspid valve regurgitation is mild to moderate.  6. The aortic valve is normal in structure. Aortic valve regurgitation is  trivial. No aortic stenosis is present.  7. The inferior vena cava is dilated in size with <50% respiratory  variability, suggesting right atrial pressure of 15 mmHg.   Recent Labs: 01/12/2020: ALT 16; TSH 0.526 01/13/2020: BUN 19; Creatinine, Ser 0.99; Hemoglobin 11.3; Platelets 211; Potassium 4.1; Sodium 142   Recent Lipid Panel    Component Value Date/Time   CHOL 115 01/13/2020 0426   TRIG 90 01/13/2020 0426   HDL 31 (L) 01/13/2020 0426   CHOLHDL 3.7 01/13/2020 0426   VLDL 18 01/13/2020 0426   LDLCALC 66 01/13/2020  0426    Physical Exam:   VS:  BP 130/83   Pulse (!) 58   Temp (!) 96.8 F (36 C)   Ht 5\' 5"  (1.651 m)   Wt 153 lb 3.2 oz (69.5 kg)   SpO2 98%   BMI 25.49 kg/m    Wt Readings from Last 3 Encounters:  02/04/20 153 lb 3.2 oz (69.5 kg)  01/13/20 155 lb 11.2 oz (70.6 kg)  06/05/19 155 lb 6.4 oz (70.5 kg)    General: Well nourished, well developed, in no acute distress Heart: Atraumatic, normal size  Eyes: PEERLA, EOMI  Neck: Supple, no JVD Endocrine: No thryomegaly Cardiac: Normal S1, S2; RRR; no murmurs, rubs, or gallops Lungs: Clear to auscultation bilaterally, no wheezing, rhonchi or rales  Abd: Soft, nontender, no hepatomegaly  Ext: No edema, pulses 2+ Musculoskeletal: No deformities, BUE and BLE strength normal and equal Skin: Warm and dry, no rashes   Neuro: Alert and oriented to person, place, time, and situation, CNII-XII grossly intact, no focal deficits  Psych: Normal mood and affect   ASSESSMENT:   BRYLEY MAMER is a 74 y.o. female who presents for the following: 1. Paroxysmal atrial fibrillation (HCC)   2. Coronary artery disease involving native coronary artery of native heart without angina pectoris   3. Essential hypertension   4. Mixed hyperlipidemia   5. RBBB   6. LAFB (left anterior fascicular block)     PLAN:   1. Paroxysmal atrial fibrillation (HCC) -Diagnosed 01/2020 in the setting of dehydration elevated lactic acid.  She converted spontaneously while on diltiazem drip in the hospital.  No further recurrence.  She is maintaining normal sinus rhythm on metoprolol succinate 20 mg daily.  Her CHADSVASC score is 4 and I have recommended continuing Eliquis 5 mg twice daily.  She will continue this.  2. Coronary artery disease involving native coronary artery of native heart without angina pectoris -Recent cardiac CTA with nonobstructive CAD.  She does have an elevated calcium score.  She will continue aspirin 81 mg daily.  She is agreeable to starting  Crestor 10 mg daily.  Most recent LDL cholesterol 66.  I still see a benefit of statin agent  in her. -Most recent echocardiogram shows EF 50-55% and no regional wall motion normalities.  Suspect this was low normal due to her A. fib with RVR and then conversion back to sinus rhythm.  3. Essential hypertension -Blood pressure well controlled.  She is on valsartan.  She is not in the dose.  She will continue this.  She will bring her dose to the next visit.  Continue metoprolol as well.  4. Mixed hyperlipidemia -Start statin agent as above.  5. RBBB 6. LAFB (left anterior fascicular block) -Echocardiogram with EF 50-55%  Disposition: Return in about 6 months (around 08/06/2020).  Medication Adjustments/Labs and Tests Ordered: Current medicines are reviewed at length with the patient today.  Concerns regarding medicines are outlined above.  Orders Placed This Encounter  Procedures  . EKG 12-Lead   Meds ordered this encounter  Medications  . rosuvastatin (CRESTOR) 10 MG tablet    Sig: Take 1 tablet (10 mg total) by mouth daily.    Dispense:  90 tablet    Refill:  3  . apixaban (ELIQUIS) 5 MG TABS tablet    Sig: Take 1 tablet (5 mg total) by mouth 2 (two) times daily.    Dispense:  180 tablet    Refill:  3  . metoprolol succinate (TOPROL XL) 25 MG 24 hr tablet    Sig: Take 1 tablet (25 mg total) by mouth daily.    Dispense:  90 tablet    Refill:  2    Patient Instructions  Medication Instructions:  Start Crestor 10 mg daily Only take Aspirin 81 mg once a day Other refill sent to pharmacy.   *If you need a refill on your cardiac medications before your next appointment, please call your pharmacy*   Follow-Up: At [facility], you and your health needs are our priority.  As part of our continuing mission to provide you with exceptional heart care, we have created designated Provider Care Teams.  These Care Teams include your primary Cardiologist (physician) and Advanced  Practice Providers (APPs -  Physician Assistants and Nurse Practitioners) who all work together to provide you with the care you need, when you need it.  We recommend signing up for the patient portal called "MyChart".  Sign up information is provided on this After Visit Summary.  MyChart is used to connect with patients for Virtual Visits (Telemedicine).  Patients are able to view lab/test results, encounter notes, upcoming appointments, etc.  Non-urgent messages can be sent to your provider as well.   To learn more about what you can do with MyChart, go to [email or web address].    Your next appointment:   6 month(s)  The format for your next appointment:   In Person  Provider:   Eleonore Chiquito, MD        Time Spent with Patient: I have spent a total of 35 minutes with patient reviewing hospital notes, telemetry, EKGs, labs and examining the patient as well as establishing an assessment and plan that was discussed with the patient.  > 50% of time was spent in direct patient care.  Signed, Addison Naegeli. Audie Box, [place]  [address] [place], [place] [address] [phone]  02/04/2020 11:19 AM

## 2020-02-04 ENCOUNTER — Other Ambulatory Visit: Payer: Self-pay

## 2020-02-04 ENCOUNTER — Ambulatory Visit (INDEPENDENT_AMBULATORY_CARE_PROVIDER_SITE_OTHER): Payer: Medicare HMO | Admitting: Cardiovascular Disease

## 2020-02-04 ENCOUNTER — Encounter: Payer: Self-pay | Admitting: Cardiovascular Disease

## 2020-02-04 VITALS — BP 130/83 | HR 58 | Temp 96.8°F | Ht 65.0 in | Wt 153.2 lb

## 2020-02-04 DIAGNOSIS — I10 Essential (primary) hypertension: Secondary | ICD-10-CM

## 2020-02-04 DIAGNOSIS — E782 Mixed hyperlipidemia: Secondary | ICD-10-CM

## 2020-02-04 DIAGNOSIS — I2510 Atherosclerotic heart disease of native coronary artery without angina pectoris: Secondary | ICD-10-CM | POA: Diagnosis not present

## 2020-02-04 DIAGNOSIS — I4510 Unspecified right bundle-branch block: Secondary | ICD-10-CM

## 2020-02-04 DIAGNOSIS — I444 Left anterior fascicular block: Secondary | ICD-10-CM

## 2020-02-04 DIAGNOSIS — I480 Paroxysmal atrial fibrillation: Secondary | ICD-10-CM | POA: Diagnosis not present

## 2020-02-04 MED ORDER — APIXABAN 5 MG PO TABS: 5.0000 mg | ORAL_TABLET | Freq: Two times a day (BID) | ORAL | 3 refills | Status: DC

## 2020-02-04 MED ORDER — METOPROLOL SUCCINATE ER 25 MG PO TB24: 25.0000 mg | ORAL_TABLET | Freq: Every day | ORAL | 2 refills | Status: DC

## 2020-02-04 MED ORDER — ROSUVASTATIN CALCIUM 10 MG PO TABS: 10.0000 mg | ORAL_TABLET | Freq: Every day | ORAL | 3 refills | Status: DC

## 2020-02-04 NOTE — Patient Instructions (Signed)
Medication Instructions:  Start Crestor 10 mg daily Only take Aspirin 81 mg once a day Other refill sent to pharmacy.   *If you need a refill on your cardiac medications before your next appointment, please call your pharmacy*   Follow-Up: At [facility], you and your health needs are our priority.  As part of our continuing mission to provide you with exceptional heart care, we have created designated Provider Care Teams.  These Care Teams include your primary Cardiologist (physician) and Advanced Practice Providers (APPs -  Physician Assistants and Nurse Practitioners) who all work together to provide you with the care you need, when you need it.  We recommend signing up for the patient portal called "MyChart".  Sign up information is provided on this After Visit Summary.  MyChart is used to connect with patients for Virtual Visits (Telemedicine).  Patients are able to view lab/test results, encounter notes, upcoming appointments, etc.  Non-urgent messages can be sent to your provider as well.   To learn more about what you can do with MyChart, go to [email or web address].    Your next appointment:   6 month(s)  The format for your next appointment:   In Person  Provider:   Eleonore Chiquito, MD

## 2020-03-16 ENCOUNTER — Telehealth: Payer: Self-pay | Admitting: Cardiovascular Disease

## 2020-03-16 NOTE — Telephone Encounter (Signed)
°  Pt c/o medication issue:  1. Name of Medication: rosuvastatin (CRESTOR) 10 MG tablet  2. How are you currently taking this medication (dosage and times per day)? Take 1 tablet (10 mg total) by mouth daily  3. Are you having a reaction (difficulty breathing--STAT)?  NA  4. What is your medication issue?   Patient states she is having cramping in her legs and feet that has been happening on and off over the last couple of weeks. She believes this is due to rosuvastatin (CRESTOR) 10 MG tablet which is a new medication for her. She would like to see if Dr Audie Box can call her in something or recommend something to help with the pain.

## 2020-03-16 NOTE — Telephone Encounter (Signed)
Spoke with pt, okay given for patient to stop rosuvastatin to see if symptoms go away. She will give [place] a call in about 2 weeks to let us know if her symptoms have improved. This is the first statin she has tried.

## 2020-07-07 ENCOUNTER — Ambulatory Visit: Payer: Medicare HMO | Attending: Internal Medicine

## 2020-07-07 DIAGNOSIS — Z23 Encounter for immunization: Secondary | ICD-10-CM

## 2020-07-07 NOTE — Progress Notes (Signed)
   Covid-19 Vaccination Clinic  Name:  Tonya [name]    MRN: [id number] DOB: 1946/02/05  07/07/2020  [name] was observed post Covid-19 immunization for 15 minutes without incident. She was provided with Vaccine Information Sheet and instruction to access the V-Safe system.   [name] was instructed to call 911 with any severe reactions post vaccine: Marland Kitchen Difficulty breathing  . Swelling of face and throat  . A fast heartbeat  . A bad rash all over body  . Dizziness and weakness

## 2020-08-02 NOTE — Progress Notes (Signed)
Cardiology Office Note:   Date:  08/04/2020  NAME:  Tonya [name]    MRN: [id number] DOB:  11-03-1945   PCP:  Alroy Dust, L.Marlou Sa, MD  Cardiologist:  Evalina Field, MD   Referring MD: Aurea Graff.Marlou Sa, MD   Chief Complaint  Patient presents with  . Follow-up   History of Present Illness:   Tonya [name] is a 74 y.o. female with a hx of pAF, non-obstructive CAD, HTN, tobacco abuse, HLD who presents for follow-up. Started on crestor 10 mg daily. She reports she had issues with pain at night.  She was having back pain and leg pain and stopped her Crestor.  She is no longer on a statin.  She reports she has been quite depressed lately.  Apparently her sister passed away.  She is having to settle her affairs.  This is caused a lot of stress in her life.  She is also reported some memory issues recently.  I do wonder if this is depression related.  She had no further recurrence of atrial fibrillation.  Pulse is regular.  Blood pressure is well controlled today.  She is on aspirin on Eliquis.  No bleeding issues reported.  She had heard about the watchman procedure.  And does inquire about this.  She has no considerably high bleeding risk and no bleeding issues and I do not think this would be indicated for her at this time.  She denies chest pain, shortness of breath or palpitations.  We also discussed the cardia mobile device to monitor her heart rhythm.  She will look into this.  Overall no further recurrence of A. fib.  She denies any chest pain.  She is not exercising routinely but will start to soon.  Again, she is recently lost her sister and going through a lot of stress in her life.  Unfortunately she is still smoking several cigarettes per day.  She is trying to cut back.  We did go over the fact that she needs to quit smoking.  Problem List 1. Atrial fibrillation, paroxysmal  -diagnosed 01/13/2020 -mild troponin elevation due to small vessel dz -CHADSVASC=4 -dx with dehydration/lactic  acidosis 2. Non-obstructive CAD -CCTA with mild to moderate CAD -negative FFR  -CAC score 1022 3. HTN 4. Tobacco abuse  5. HLD -T chol 113, HDL 31, LDL 66, TG 90 6. RBBB/LAFB  Past Medical History: Past Medical History:  Diagnosis Date  . Allergy   . Hyperlipidemia   . Hypertension   . [name] disease    per her report from skin biopsy prior  . Osteoarthritis    knee, hips  . Plantar fasciitis    right foot  . Tobacco use disorder     Past Surgical History: Past Surgical History:  Procedure Laterality Date  . ABDOMINAL HYSTERECTOMY     partial  . CHOLECYSTECTOMY  2003  . COLONOSCOPY  2010  . WISDOM TOOTH EXTRACTION      Current Medications: Current Meds  Medication Sig  . apixaban (ELIQUIS) 5 MG TABS tablet Take 1 tablet (5 mg total) by mouth 2 (two) times daily.  Marland Kitchen aspirin 81 MG chewable tablet Chew 1 tablet (81 mg total) by mouth daily.  . cetirizine (ZYRTEC) 10 MG tablet Take 1 tablet (10 mg total) by mouth daily.  . cyclobenzaprine (FLEXERIL) 5 MG tablet Take 1 tablet (5 mg total) by mouth 3 (three) times daily as needed for muscle spasms.  . cycloSPORINE (RESTASIS) 0.05 % ophthalmic emulsion Place 1 drop  into both eyes 2 (two) times daily.   . fluticasone (FLONASE) 50 MCG/ACT nasal spray Place 2 sprays into both nostrils daily. (Patient taking differently: Place 2 sprays into both nostrils daily as needed for allergies or rhinitis. )  . latanoprost (XALATAN) 0.005 % ophthalmic solution Place 1 drop into both eyes at bedtime.  . metoprolol succinate (TOPROL XL) 25 MG 24 hr tablet Take 1 tablet (25 mg total) by mouth daily.  . Multiple Vitamins-Minerals (VITAMIN D3 COMPLETE PO) Take 2 tablets by mouth daily. Taking 2-3 tablets daily   . valsartan-hydrochlorothiazide (DIOVAN-HCT) 320-12.5 MG tablet Take 1 tablet by mouth every morning.  . [DISCONTINUED] apixaban (ELIQUIS) 5 MG TABS tablet Take 1 tablet (5 mg total) by mouth 2 (two) times daily.  .  [DISCONTINUED] metoprolol succinate (TOPROL XL) 25 MG 24 hr tablet Take 1 tablet (25 mg total) by mouth daily.     Allergies:    Penicillins   Social History: Social History   Socioeconomic History  . Marital status: Single    Spouse name: Not on file  . Number of children: Not on file  . Years of education: Not on file  . Highest education level: Not on file  Occupational History  . Not on file  Tobacco Use  . Smoking status: Current Every Day Smoker    Packs/day: 1.00    Years: 50.00    Pack years: 50.00    Types: Cigarettes  . Smokeless tobacco: Never Used  Vaping Use  . Vaping Use: Never used  Substance and Sexual Activity  . Alcohol use: Not Currently    Alcohol/week: 0.0 standard drinks    Comment: occ  . Drug use: No  . Sexual activity: Never  Other Topics Concern  . Not on file  Social History Narrative  . Not on file   Social Determinants of Health   Financial Resource Strain:   . Difficulty of Paying Living Expenses: Not on file  Food Insecurity:   . Worried About Charity fundraiser in the Last Year: Not on file  . Ran Out of Food in the Last Year: Not on file  Transportation Needs:   . Lack of Transportation (Medical): Not on file  . Lack of Transportation (Non-Medical): Not on file  Physical Activity:   . Days of Exercise per Week: Not on file  . Minutes of Exercise per Session: Not on file  Stress:   . Feeling of Stress : Not on file  Social Connections:   . Frequency of Communication with Friends and Family: Not on file  . Frequency of Social Gatherings with Friends and Family: Not on file  . Attends Religious Services: Not on file  . Active Member of Clubs or Organizations: Not on file  . Attends Archivist Meetings: Not on file  . Marital Status: Not on file     Family History: The patient's family history includes Heart disease in her father; Hypertension in her mother; Liver disease in her brother; Other in her brother;  Pneumonia in her brother; Stroke in her mother.  ROS:   All other ROS reviewed and negative. Pertinent positives noted in the HPI.      CCTA 07/09/2019 IMPRESSION: 1. Coronary calcium score of 1022. This was 97th percentile for age and sex matched control.  2. Normal coronary origin with right dominance.  3. Mild calcified CAD in the LAD and LCX (25-49%) that is non-obstructive.  4. Likely mild calcified CAD in [facility]  that is non-obstructive but cannot exclude a moderate stenosis.  5. Moderate aortic atherosclerosis.  RECOMMENDATIONS: 1. Consider aggressive risk factor modification.  2. Will send CT-FFR to exclude significant stenosis in the mRCA.  2. LAD: 0.95; no significant stenosis. 3. LCX: 0.85; no significant stenosis. 4. RCA: 0.93; no significant stenosis.  IMPRESSION: 1.  CT FFR analysis didn't show any significant stenosis.  TTE 01/13/2020 1. Left ventricular ejection fraction, by estimation, is 50 to 55%. The  left ventricle has low normal function. The left ventricle has no regional  wall motion abnormalities. Left ventricular diastolic parameters are  consistent with Grade I diastolic  dysfunction (impaired relaxation).  2. Right ventricular systolic function is normal. The right ventricular  size is normal. There is moderately elevated pulmonary artery systolic  pressure.  3. Left atrial size was mildly dilated.  4. The mitral valve is normal in structure. Mild mitral valve  regurgitation. No evidence of mitral stenosis.  5. Tricuspid valve regurgitation is mild to moderate.  6. The aortic valve is normal in structure. Aortic valve regurgitation is  trivial. No aortic stenosis is present.  7. The inferior vena cava is dilated in size with <50% respiratory  variability, suggesting right atrial pressure of 15 mmHg.   Recent Labs: 01/12/2020: ALT 16; TSH 0.526 01/13/2020: BUN 19; Creatinine, Ser 0.99; Hemoglobin 11.3; Platelets 211; Potassium  4.1; Sodium 142   Recent Lipid Panel    Component Value Date/Time   CHOL 115 01/13/2020 0426   TRIG 90 01/13/2020 0426   HDL 31 (L) 01/13/2020 0426   CHOLHDL 3.7 01/13/2020 0426   VLDL 18 01/13/2020 0426   LDLCALC 66 01/13/2020 0426    Physical Exam:   VS:  BP 132/72   Pulse 60   Ht 5\' 5"  (1.651 m)   Wt 159 lb 9.6 oz (72.4 kg)   BMI 26.56 kg/m    Wt Readings from Last 3 Encounters:  08/04/20 159 lb 9.6 oz (72.4 kg)  02/04/20 153 lb 3.2 oz (69.5 kg)  01/13/20 155 lb 11.2 oz (70.6 kg)    General: Well nourished, well developed, in no acute distress Heart: Atraumatic, normal size  Eyes: PEERLA, EOMI  Neck: Supple, no JVD Endocrine: No thryomegaly Cardiac: Normal S1, S2; RRR; no murmurs, rubs, or gallops Lungs: Clear to auscultation bilaterally, no wheezing, rhonchi or rales  Abd: Soft, nontender, no hepatomegaly  Ext: No edema, pulses 2+ Musculoskeletal: No deformities, BUE and BLE strength normal and equal Skin: Warm and dry, no rashes   Neuro: Alert and oriented to person, place, time, and situation, CNII-XII grossly intact, no focal deficits  Psych: Normal mood and affect   ASSESSMENT:   Tonya [name] is a 74 y.o. female who presents for the following: 1. Paroxysmal atrial fibrillation (HCC)   2. Coronary artery disease involving native coronary artery of native heart without angina pectoris   3. Mixed hyperlipidemia   4. Essential hypertension   5. RBBB   6. LAFB (left anterior fascicular block)   7. Tobacco abuse     PLAN:   1. Paroxysmal atrial fibrillation ([place]) -She developed atrial fibrillation in April 2021 in the setting of dehydration and lactic acidosis.  She converted back to normal sinus rhythm with diltiazem.  She has been maintaining sinus rhythm.  I recommended the cardia mobile for monitoring.  She will look into this. -Her chads vas score is 4.  She will continue Eliquis 5 mg twice daily. -She will also continue metoprolol.  2. Coronary  artery disease involving native coronary artery of native heart without angina pectoris -Nonobstructive CAD with negative FFR.  She had a coronary CTA.  Elevated coronary calcium score.  I recommended aspirin and statin therapy.  Most recent LDL was 66.  Cannot tolerate Crestor.  Switch to Lipitor 20 mg daily.  3. Mixed hyperlipidemia -Cannot tolerate Crestor.  Start Lipitor 20 mg daily.  4. Essential hypertension -BP well controlled.  Continue valsartan-HCTZ and metoprolol.  5. RBBB 6. LAFB (left anterior fascicular block) -EF 50-55%.  No wall motion normalities.  7. Tobacco abuse -Counseled smoking cessation today.  Disposition: Return in about 6 months (around 02/01/2021).  Medication Adjustments/Labs and Tests Ordered: Current medicines are reviewed at length with the patient today.  Concerns regarding medicines are outlined above.  No orders of the defined types were placed in this encounter.  Meds ordered this encounter  Medications  . atorvastatin (LIPITOR) 20 MG tablet    Sig: Take 1 tablet (20 mg total) by mouth daily.    Dispense:  90 tablet    Refill:  3  . apixaban (ELIQUIS) 5 MG TABS tablet    Sig: Take 1 tablet (5 mg total) by mouth 2 (two) times daily.    Dispense:  180 tablet    Refill:  3  . metoprolol succinate (TOPROL XL) 25 MG 24 hr tablet    Sig: Take 1 tablet (25 mg total) by mouth daily.    Dispense:  90 tablet    Refill:  3    Patient Instructions  Medication Instructions:  Start Lipitor 20 mg daily after supper Continue all other medications  *If you need a refill on your cardiac medications before your next appointment, please call your pharmacy*   Lab Work: None ordered   Testing/Procedures: None ordered   Follow-Up: At [facility], you and your health needs are our priority.  As part of our continuing mission to provide you with exceptional heart care, we have created designated Provider Care Teams.  These Care Teams include your  primary Cardiologist (physician) and Advanced Practice Providers (APPs -  Physician Assistants and Nurse Practitioners) who all work together to provide you with the care you need, when you need it.  We recommend signing up for the patient portal called "MyChart".  Sign up information is provided on this After Visit Summary.  MyChart is used to connect with patients for Virtual Visits (Telemedicine).  Patients are able to view lab/test results, encounter notes, upcoming appointments, etc.  Non-urgent messages can be sent to your provider as well.   To learn more about what you can do with MyChart, go to [email or web address].    Your next appointment: 6 months    Call in Feb to schedule May appointment      The format for your next appointment: Office    Provider:  Dr.O'Neal  [facility]     Time Spent with Patient: I have spent a total of 35 minutes with patient reviewing hospital notes, telemetry, EKGs, labs and examining the patient as well as establishing an assessment and plan that was discussed with the patient.  > 50% of time was spent in direct patient care.  Signed, Addison Naegeli. Audie Box, [place]  [address] [place], [place] [address] [phone]  08/04/2020 10:37 AM

## 2020-08-04 ENCOUNTER — Ambulatory Visit: Payer: Medicare HMO | Admitting: Cardiovascular Disease

## 2020-08-04 ENCOUNTER — Encounter: Payer: Self-pay | Admitting: Cardiovascular Disease

## 2020-08-04 ENCOUNTER — Other Ambulatory Visit: Payer: Self-pay

## 2020-08-04 VITALS — BP 132/72 | HR 60 | Ht 65.0 in | Wt 159.6 lb

## 2020-08-04 DIAGNOSIS — E782 Mixed hyperlipidemia: Secondary | ICD-10-CM | POA: Diagnosis not present

## 2020-08-04 DIAGNOSIS — I10 Essential (primary) hypertension: Secondary | ICD-10-CM

## 2020-08-04 DIAGNOSIS — I480 Paroxysmal atrial fibrillation: Secondary | ICD-10-CM | POA: Diagnosis not present

## 2020-08-04 DIAGNOSIS — Z720 Tobacco use: Secondary | ICD-10-CM

## 2020-08-04 DIAGNOSIS — I4510 Unspecified right bundle-branch block: Secondary | ICD-10-CM

## 2020-08-04 DIAGNOSIS — I2510 Atherosclerotic heart disease of native coronary artery without angina pectoris: Secondary | ICD-10-CM | POA: Diagnosis not present

## 2020-08-04 DIAGNOSIS — I444 Left anterior fascicular block: Secondary | ICD-10-CM

## 2020-08-04 MED ORDER — APIXABAN 5 MG PO TABS: 5.0000 mg | ORAL_TABLET | Freq: Two times a day (BID) | ORAL | 3 refills | Status: DC

## 2020-08-04 MED ORDER — ATORVASTATIN CALCIUM 20 MG PO TABS: 20.0000 mg | ORAL_TABLET | Freq: Every day | ORAL | 3 refills | Status: DC

## 2020-08-04 MED ORDER — METOPROLOL SUCCINATE ER 25 MG PO TB24: 25.0000 mg | ORAL_TABLET | Freq: Every day | ORAL | 3 refills | Status: DC

## 2020-08-04 NOTE — Patient Instructions (Signed)
Medication Instructions:  Start Lipitor 20 mg daily after supper Continue all other medications  *If you need a refill on your cardiac medications before your next appointment, please call your pharmacy*   Lab Work: None ordered   Testing/Procedures: None ordered   Follow-Up: At [facility], you and your health needs are our priority.  As part of our continuing mission to provide you with exceptional heart care, we have created designated Provider Care Teams.  These Care Teams include your primary Cardiologist (physician) and Advanced Practice Providers (APPs -  Physician Assistants and Nurse Practitioners) who all work together to provide you with the care you need, when you need it.  We recommend signing up for the patient portal called "MyChart".  Sign up information is provided on this After Visit Summary.  MyChart is used to connect with patients for Virtual Visits (Telemedicine).  Patients are able to view lab/test results, encounter notes, upcoming appointments, etc.  Non-urgent messages can be sent to your provider as well.   To learn more about what you can do with MyChart, go to [email or web address].    Your next appointment: 6 months    Call in Feb to schedule May appointment      The format for your next appointment: Office    Provider:  Dr.O'Neal  [facility]

## 2020-09-16 ENCOUNTER — Telehealth: Payer: Self-pay | Admitting: Cardiovascular Disease

## 2020-09-16 NOTE — Telephone Encounter (Signed)
Tonya [name] called to ask if Dr. Audie Box can prescribe anything for anxiety. She states she felt like she was going to go into an afib episode as her heart was "just pounding away". She is going out of town on the 18th and would like something that she could take just in case she feels like she is going into an episode. Tonya [name] not complaining of any symptoms and states she has been fine since Sunday. Please call/advise

## 2020-09-16 NOTE — Telephone Encounter (Signed)
Patient states her heart was "pumping real hard"  She does not have a monitor to assess rhythm   She going out of town Dec 18 - Jan 3 She would not be able to wear a monitor during this time  Advised that she contact PCP for anxiety medication and touch base after she returns to discuss monitor.

## 2020-09-16 NOTE — Telephone Encounter (Signed)
Agree. Thanks

## 2020-09-16 NOTE — Telephone Encounter (Signed)
Does she have a monitor? How did she know it was Afib? Hard for me to prescribe something if we don't know what it was.   If no documentation, would recommend a 1 week zio and to continue current medications.   [place] T. Audie Box, [place]  [address], [place], [place] [address] [phone]  1:38 PM

## 2020-09-16 NOTE — Telephone Encounter (Signed)
Returned call to patient of Dr. Audie Box who reports an AFib episode on 09/13/20. She reports the episode lasted about 10-15 minutes. No triggers. She reports a mental trigger cause the AFib episode. She reports her heart was pounding. She took medications as prescribed - took metoprolol during the episode during the day, instead of at night.   Patient reports this is the first episode of anxiety-triggered AFib since ED in June 2021.   Advised that generally these requests generally come from PCP but will route to Dr. Audie Box

## 2020-10-30 ENCOUNTER — Other Ambulatory Visit: Payer: Self-pay | Admitting: Cardiovascular Disease

## 2020-11-11 ENCOUNTER — Ambulatory Visit: Payer: Medicare HMO | Admitting: Cardiovascular Disease

## 2020-12-01 ENCOUNTER — Ambulatory Visit (INDEPENDENT_AMBULATORY_CARE_PROVIDER_SITE_OTHER): Payer: Medicare HMO

## 2020-12-01 ENCOUNTER — Ambulatory Visit: Payer: Medicare HMO | Admitting: Orthopaedic Surgery

## 2020-12-01 ENCOUNTER — Encounter: Payer: Self-pay | Admitting: Orthopaedic Surgery

## 2020-12-01 VITALS — Ht 66.0 in | Wt 156.0 lb

## 2020-12-01 DIAGNOSIS — M25561 Pain in right knee: Secondary | ICD-10-CM

## 2020-12-01 MED ORDER — BUPIVACAINE HCL 0.5 % IJ SOLN
2.0000 mL | INTRAMUSCULAR | Status: AC | PRN
  Administered 2020-12-01: 2 mL via INTRA_ARTICULAR

## 2020-12-01 MED ORDER — METHYLPREDNISOLONE ACETATE 40 MG/ML IJ SUSP
40.0000 mg | INTRAMUSCULAR | Status: AC | PRN
  Administered 2020-12-01: 40 mg via INTRA_ARTICULAR

## 2020-12-01 MED ORDER — LIDOCAINE HCL 1 % IJ SOLN
2.0000 mL | INTRAMUSCULAR | Status: AC | PRN
  Administered 2020-12-01: 2 mL

## 2020-12-01 NOTE — Progress Notes (Signed)
Office Visit Note   Patient: Tonya [name]           Date of Birth: [birth year]           MRN: [id number] Visit Date: 12/01/2020              Requested by: Alroy Dust, L.Marlou Sa, [place] [employer] [place],  [place] [address] PCP: Alroy Dust, L.Marlou Sa, MD   Assessment & Plan: Visit Diagnoses:  1. Acute pain of right knee     Plan: Impression is right knee osteoarthritis/pseudogout flareup.  She has minimal effusion.  Range of motion is well-preserved.  Based on discussion of treatment options she agreed to cortisone injection which she tolerated well.  She will follow-up should her symptoms return or fail to improve.  Follow-Up Instructions: Return if symptoms worsen or fail to improve.   Orders:  Orders Placed This Encounter  Procedures  . XR KNEE 3 VIEW RIGHT   No orders of the defined types were placed in this encounter.     Procedures: Large Joint Inj: R knee on 12/01/2020 8:59 AM Indications: pain Details: 22 G needle  Arthrogram: No  Medications: 40 mg methylPREDNISolone acetate 40 MG/ML; 2 mL lidocaine 1 %; 2 mL bupivacaine 0.5 % Consent was given by the patient. Patient was prepped and draped in the usual sterile fashion.       Clinical Data: No additional findings.   Subjective: Chief Complaint  Patient presents with  . Right Knee - Pain    Eather is a very pleasant 75 year old female who comes in for evaluation of cute onset of right knee pain for about 2 weeks.  Denies any injuries.  This started shortly after she initiated an exercise program.  Denies any mechanical symptoms.  She does have some pain in the back of the knee that has improved with ibuprofen.  Denies any history of gout.   Review of Systems  Constitutional: Negative.   HENT: Negative.   Eyes: Negative.   Respiratory: Negative.   Cardiovascular: Negative.   Endocrine: Negative.   Musculoskeletal: Negative.   Neurological: Negative.   Hematological: Negative.    Psychiatric/Behavioral: Negative.   All other systems reviewed and are negative.    Objective: Vital Signs: Ht 5\' 6"  (1.676 m)   Wt 156 lb (70.8 kg)   BMI 25.18 kg/m   Physical Exam Vitals and nursing note reviewed.  Constitutional:      Appearance: She is well-developed and well-nourished.  HENT:     Head: Normocephalic and atraumatic.  Eyes:     Extraocular Movements: EOM normal.  Pulmonary:     Effort: Pulmonary effort is normal.  Abdominal:     Palpations: Abdomen is soft.  Musculoskeletal:     Cervical back: Neck supple.  Skin:    General: Skin is warm.     Capillary Refill: Capillary refill takes less than 2 seconds.  Neurological:     Mental Status: She is alert and oriented to person, place, and time.  Psychiatric:        Mood and Affect: Mood and affect normal.        Behavior: Behavior normal.        Thought Content: Thought content normal.        Judgment: Judgment normal.     Ortho Exam Right knee shows a trace effusion.  No popliteal masses.  Collaterals and cruciates are stable.  Range of motion is pretty much normal. Specialty Comments:  No specialty  comments available.  Imaging: XR KNEE 3 VIEW RIGHT  Result Date: 12/01/2020 Moderate degenerative changes.  Chondrocalcinosis present.    PMFS History: Patient Active Problem List   Diagnosis Date Noted  . Lactic acidosis 01/13/2020  . Elevated troponin 01/13/2020  . Demand ischemia ([place]) 01/13/2020  . AKI (acute kidney injury) ([place]) 01/13/2020  . Atrial fibrillation ([place]) 01/12/2020  . Physical exam 07/02/2015  . Eustachian tube dysfunction 05/29/2015  . Hyperlipidemia 06/04/2013  . PVCs (premature ventricular contractions) 06/04/2013  . Snoring 11/08/2011  . Sleep disturbance 11/08/2011  . Need for prophylactic vaccination and inoculation against influenza 11/08/2011  . Essential hypertension, benign 07/13/2011  . Tobacco use disorder 07/13/2011   Past Medical History:  Diagnosis Date   . Allergy   . Hyperlipidemia   . Hypertension   . [name] disease    per her report from skin biopsy prior  . Osteoarthritis    knee, hips  . Plantar fasciitis    right foot  . Tobacco use disorder     Family History  Problem Relation Age of Onset  . Hypertension Mother   . Stroke Mother        multiple  . Heart disease Father        reportedly died of MI  . Pneumonia Brother        died of pneumonia  . Other Brother        died in surgery, reportedly due to lung disease  . Liver disease Brother        died of NASH    Past Surgical History:  Procedure Laterality Date  . ABDOMINAL HYSTERECTOMY     partial  . CHOLECYSTECTOMY  2003  . COLONOSCOPY  2010  . WISDOM TOOTH EXTRACTION     Social History   Occupational History  . Not on file  Tobacco Use  . Smoking status: Current Every Day Smoker    Packs/day: 1.00    Years: 50.00    Pack years: 50.00    Types: Cigarettes  . Smokeless tobacco: Never Used  Vaping Use  . Vaping Use: Never used  Substance and Sexual Activity  . Alcohol use: Not Currently    Alcohol/week: 0.0 standard drinks    Comment: occ  . Drug use: No  . Sexual activity: Never

## 2020-12-30 IMAGING — CT CT NECK WITH CONTRAST
2 of 13 series · 5 of 33 positions shown, 6 images · IV contrast (omnipaque)
Comparison: None.

CLINICAL DATA: Neck pain that radiates into the right arm.

EXAM:
CT NECK WITH CONTRAST
TECHNIQUE: Multidetector CT imaging of the neck was performed using the
standard protocol following the bolus administration of intravenous
contrast.
CONTRAST:  100mL OMNIPAQUE IOHEXOL 350 MG/ML SOLN

[Series 8: axial arterial · axial · arterial · 0.66mm/px · z∈[-716,-521]mm · 2 of 196 slices shown, 3 images]
[im 66/196  soft-tissue]
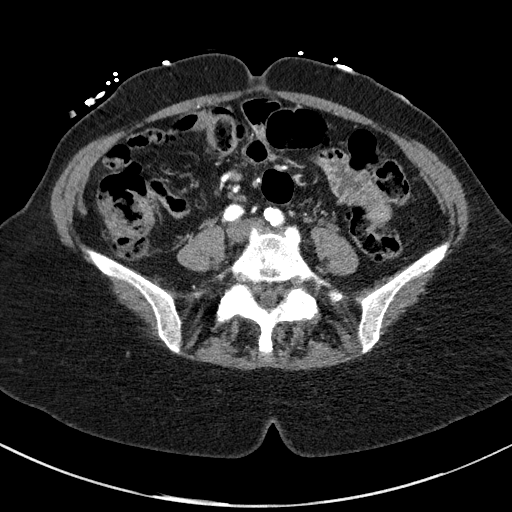
[im 66/196  bone]
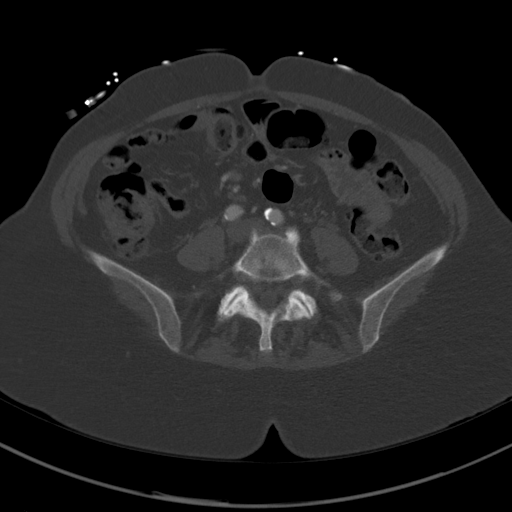
[im 131/196  bone]
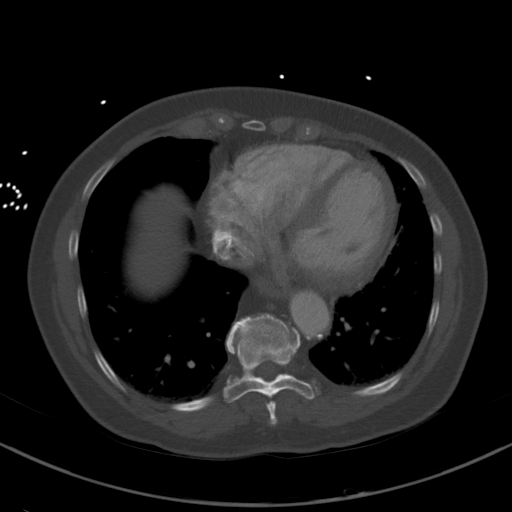

[Series 11: sagittals · sagittal · 0.55mm/px · 3 of 168 slices shown]
[im 42/168  bone]
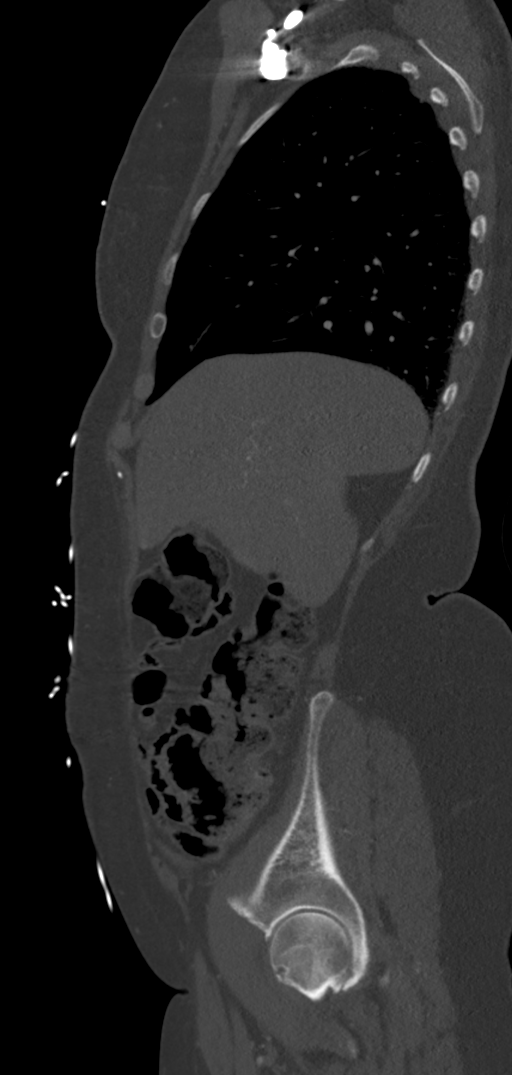
[im 84/168  bone]
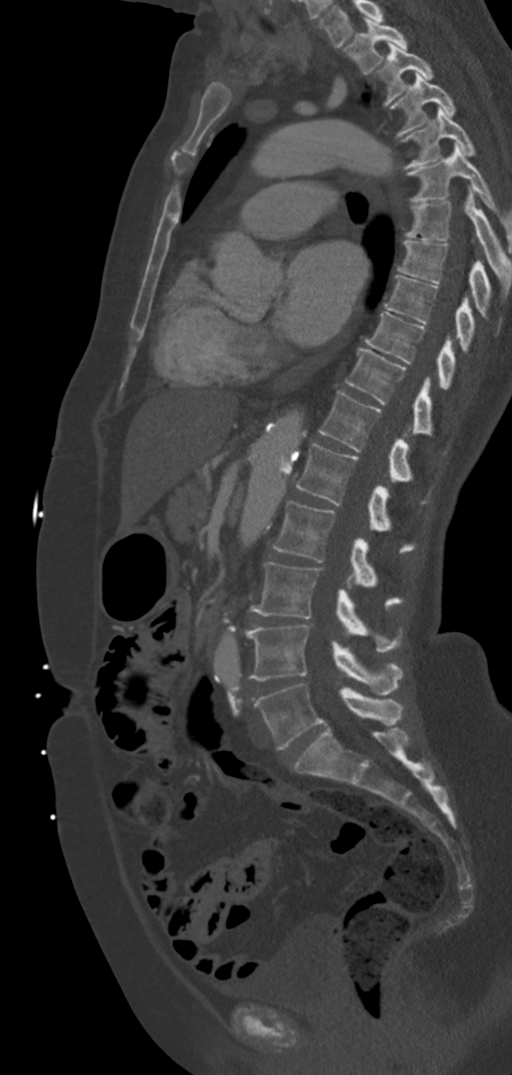
[im 126/168  bone]
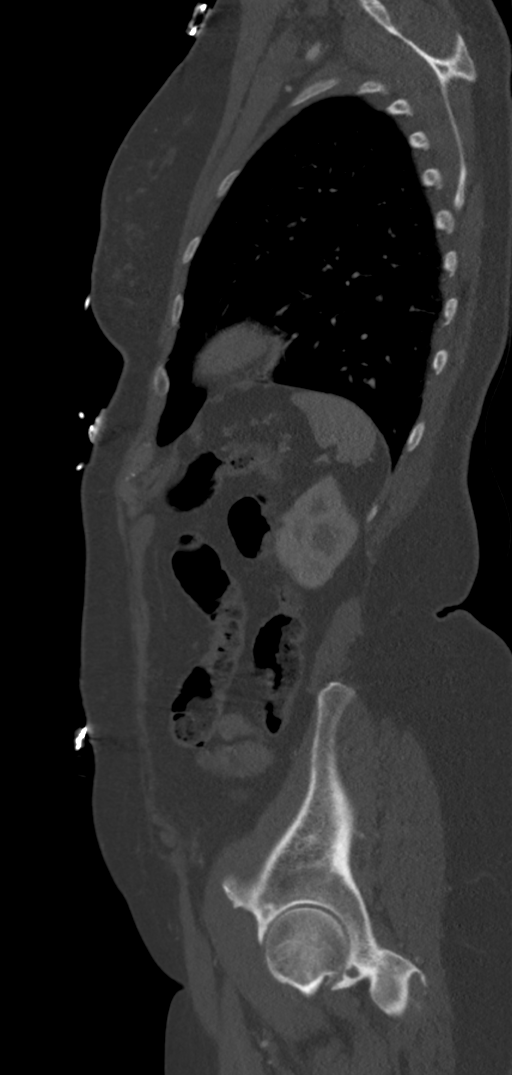

[5 of 33 positions shown; findings below may reference images not displayed]

FINDINGS: PHARYNX AND LARYNX:

--Nasopharynx: Fossae of Leoci are clear. Normal adenoid
tonsils for age.

--Oral cavity and oropharynx: The palatine and lingual tonsils are
normal. The visible oral cavity and floor of mouth are normal.

--Hypopharynx: Normal vallecula and pyriform sinuses.

--Larynx: Normal epiglottis and pre-epiglottic space. Normal
aryepiglottic and vocal folds.

--Retropharyngeal space: No abscess, effusion or lymphadenopathy.

SALIVARY GLANDS:

--Parotid: No mass lesion or inflammation. No sialolithiasis or
ductal dilatation.

--Submandibular: Prominent bilateral intraglandular ducts. No
sialolithiasis.

--Sublingual: Normal. No ranula or other visible lesion of the base
of tongue and floor of mouth.

THYROID: Normal.

LYMPH NODES: No enlarged or abnormal density lymph nodes.

VASCULAR: Major cervical vessels are patent.

LIMITED INTRACRANIAL: Normal.

VISUALIZED ORBITS: Normal.

MASTOIDS AND VISUALIZED PARANASAL SINUSES: No fluid levels or
advanced mucosal thickening. No mastoid effusion.

SKELETON: There is no bony spinal canal stenosis. Degenerative disc
disease is greatest at C5-7. There is bilateral neural foraminal
stenosis at both levels, greatest at right C5-6.

UPPER CHEST: There is scarring at the left lung apex.

OTHER: None.
IMPRESSION: 1. No acute soft tissue abnormality of the neck.
2. Lower cervical degenerative disc disease with moderate-to-severe
neural foraminal stenosis bilaterally at C5-6 and C6-7.

## 2021-01-12 ENCOUNTER — Ambulatory Visit (INDEPENDENT_AMBULATORY_CARE_PROVIDER_SITE_OTHER): Payer: Medicare HMO | Admitting: Orthopaedic Surgery

## 2021-01-12 ENCOUNTER — Other Ambulatory Visit: Payer: Self-pay

## 2021-01-12 DIAGNOSIS — M1711 Unilateral primary osteoarthritis, right knee: Secondary | ICD-10-CM

## 2021-01-12 NOTE — Progress Notes (Signed)
Office Visit Note   Patient: Tonya [name]           Date of Birth: 1945-10-26           MRN: [id number] Visit Date: 01/12/2021              Requested by: Tonya [name], [place] [employer] [address] [place],  [place] [address] PCP: Tonya Dust, L.Marlou Sa, MD   Assessment & Plan: Visit Diagnoses:  1. Primary osteoarthritis of right knee     Plan: Impression is advanced degenerative joint disease of the right knee.  Again we discussed conservative versus surgical treatment options and their associated risks and benefits and rehab and recovery.  Given failure of conservative treatments she is interested in learning more about knee replacement surgery.  Handout was provided today.  She will let us know if she decides to proceed with knee replacement.  Follow-Up Instructions: Return if symptoms worsen or fail to improve.   Orders:  No orders of the defined types were placed in this encounter.  No orders of the defined types were placed in this encounter.     Procedures: No procedures performed   Clinical Data: No additional findings.   Subjective: Chief Complaint  Patient presents with  . Right Knee - Pain    [name] returns today for follow-up of right knee pain status post cortisone injection on 12/01/2020.  She states that the pain initially went away temporarily but now has returned.  She has chronic aching pain that is limiting her ADLs.  She has nighttime pain as well.  She has noticed change in her quality of life due to the right knee pain.   Review of Systems   Objective: Vital Signs: There were no vitals taken for this visit.  Physical Exam  Ortho Exam Right knee shows no joint effusion.  Significant pain and patellofemoral crepitus with range of motion.  Moderate restriction of range of motion. Specialty Comments:  No specialty comments available.  Imaging: No results found.   PMFS History: Patient Active Problem List   Diagnosis Date Noted  .  Lactic acidosis 01/13/2020  . Elevated troponin 01/13/2020  . Demand ischemia ([place]) 01/13/2020  . AKI (acute kidney injury) ([place]) 01/13/2020  . Atrial fibrillation ([place]) 01/12/2020  . Physical exam 07/02/2015  . Eustachian tube dysfunction 05/29/2015  . Hyperlipidemia 06/04/2013  . PVCs (premature ventricular contractions) 06/04/2013  . Snoring 11/08/2011  . Sleep disturbance 11/08/2011  . Need for prophylactic vaccination and inoculation against influenza 11/08/2011  . Essential hypertension, benign 07/13/2011  . Tobacco use disorder 07/13/2011   Past Medical History:  Diagnosis Date  . Allergy   . Hyperlipidemia   . Hypertension   . [name] disease    per her report from skin biopsy prior  . Osteoarthritis    knee, hips  . Plantar fasciitis    right foot  . Tobacco use disorder     Family History  Problem Relation Age of Onset  . Hypertension Mother   . Stroke Mother        multiple  . Heart disease Father        reportedly died of MI  . Pneumonia Brother        died of pneumonia  . Other Brother        died in surgery, reportedly due to lung disease  . Liver disease Brother        died of NASH    Past Surgical  History:  Procedure Laterality Date  . ABDOMINAL HYSTERECTOMY     partial  . CHOLECYSTECTOMY  2003  . COLONOSCOPY  2010  . WISDOM TOOTH EXTRACTION     Social History   Occupational History  . Not on file  Tobacco Use  . Smoking status: Current Every Day Smoker    Packs/day: 1.00    Years: 50.00    Pack years: 50.00    Types: Cigarettes  . Smokeless tobacco: Never Used  Vaping Use  . Vaping Use: Never used  Substance and Sexual Activity  . Alcohol use: Not Currently    Alcohol/week: 0.0 standard drinks    Comment: occ  . Drug use: No  . Sexual activity: Never

## 2021-01-27 ENCOUNTER — Telehealth: Payer: Self-pay | Admitting: Orthopaedic Surgery

## 2021-01-27 NOTE — Telephone Encounter (Signed)
Melissa with Dr. Rosina Lowenstein office states she has been calling trying to get the pts office notes from 12/01/20 and 01/12/21 faxed over for 3 days; the pt is now at their office for the appt and she would like to have this faxed as soon as possible.    Melissa Fax# [phone] [employer] Jah CB# [phone]

## 2021-01-27 NOTE — Telephone Encounter (Signed)
faxed

## 2021-02-08 ENCOUNTER — Telehealth: Payer: Self-pay | Admitting: Orthopaedic Surgery

## 2021-02-08 NOTE — Telephone Encounter (Signed)
Pt called and was wondering about when she can start setting up surgery! CB [phone]

## 2021-02-16 ENCOUNTER — Other Ambulatory Visit: Payer: Self-pay

## 2021-02-21 IMAGING — CT CT CORONARY FRACTIONAL FLOW RESERVE
4 series · 12 of 20 positions shown, 13 images · IV contrast (APPLIED)
Comparison: none

EXAM:
CT FFR ANALYSIS
CLINICAL DATA: Chest pain

[Series 6: best diast 77 % · axial · 0.39mm/px · z∈[-174,-110]mm · 3 of 321 slices shown, 4 images]
[im 81/321  vessel]
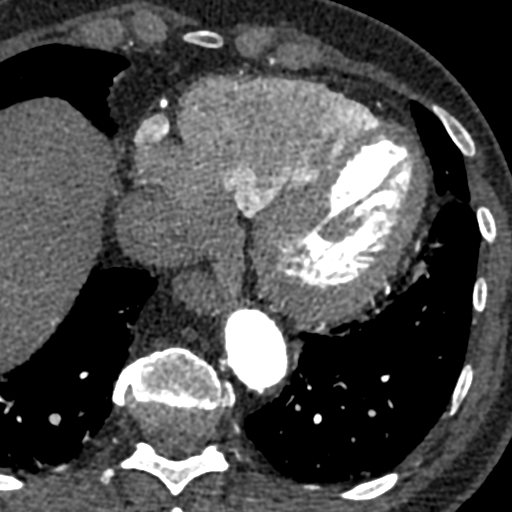
[im 81/321  lung]
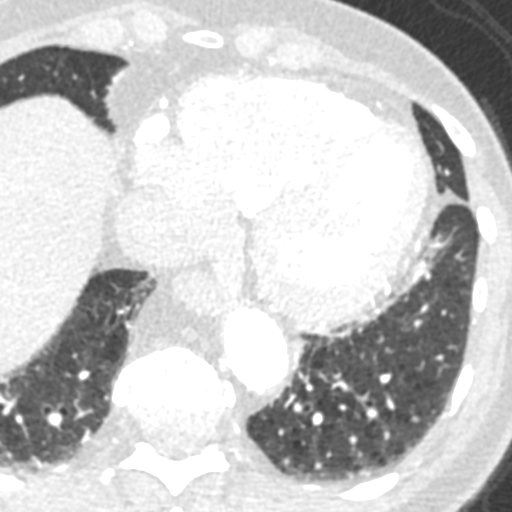
[im 161/321  vessel]
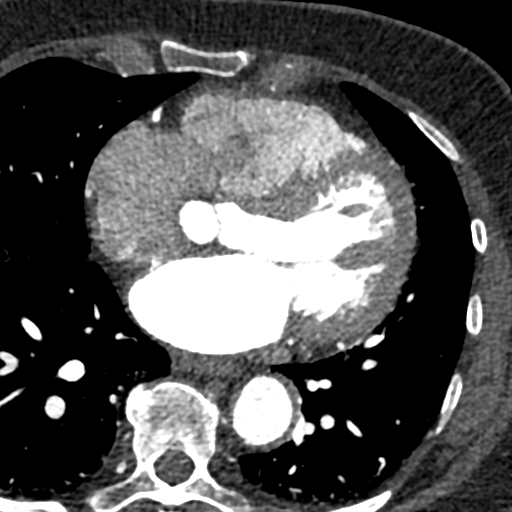
[im 241/321  vessel]
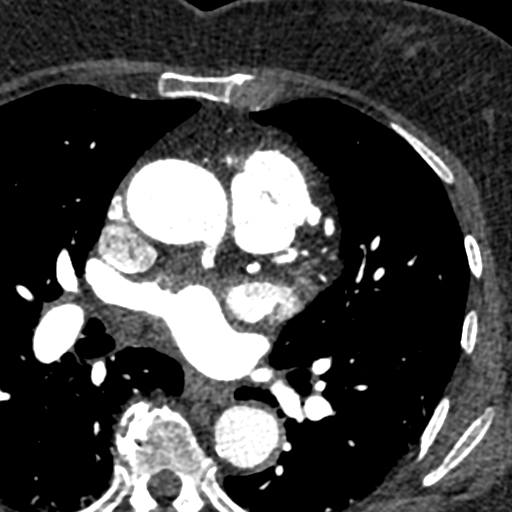

[Series 7: best syst 77 % · axial · 0.39mm/px · z∈[-174,-110]mm · 3 of 321 slices shown]
[im 81/321  vessel]
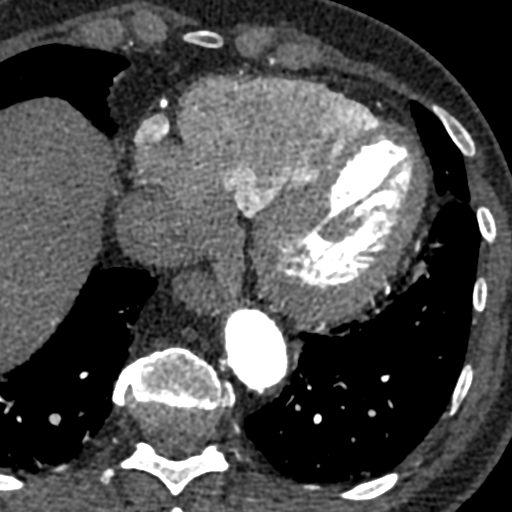
[im 161/321  vessel]
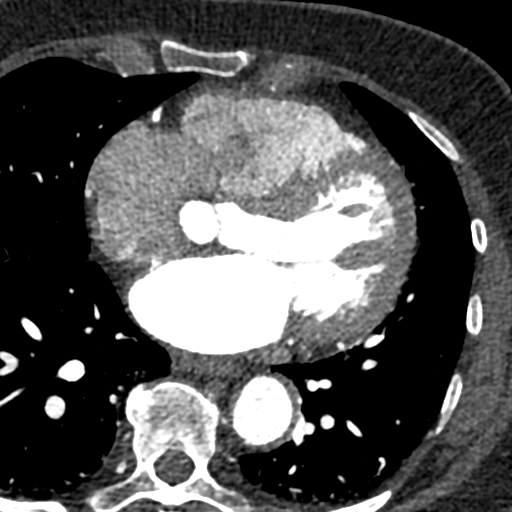
[im 241/321  vessel]
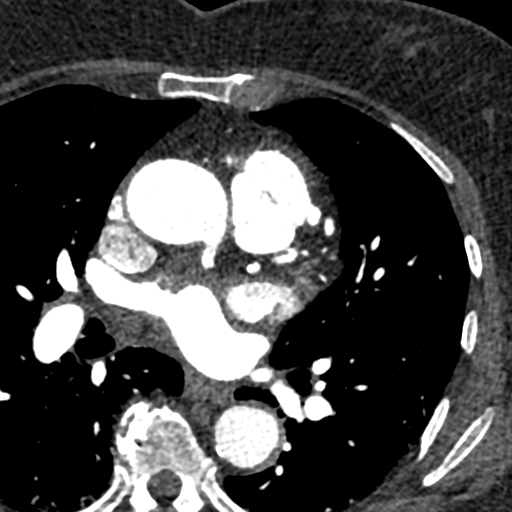

[Series 8: ts diast sharp 77 % · axial · 0.39mm/px · z∈[-174,-110]mm · 3 of 321 slices shown]
[im 81/321  lung]
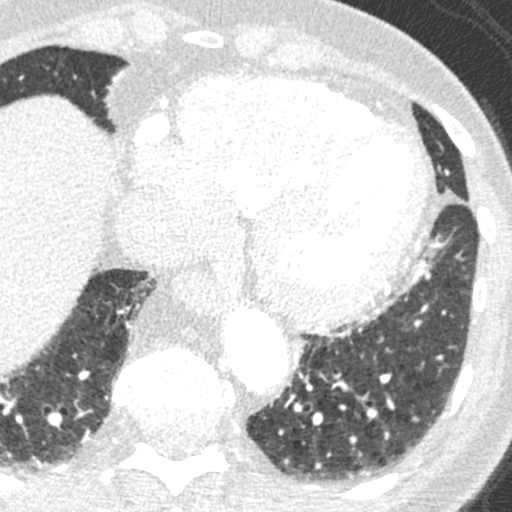
[im 161/321  lung]
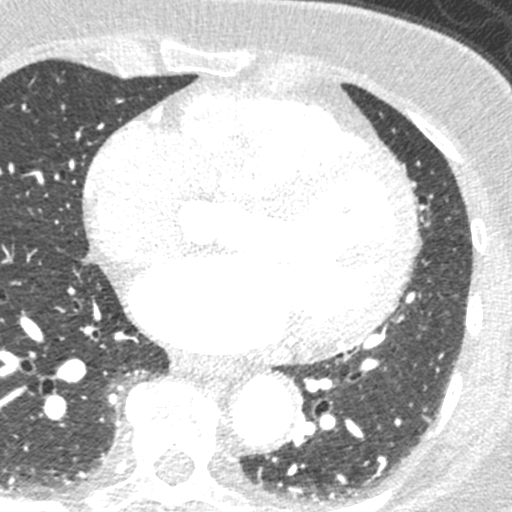
[im 241/321  lung]
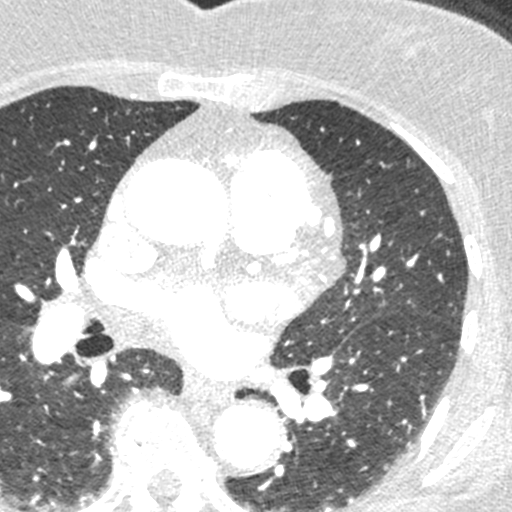

[Series 9: ts syst sharp 77 % · axial · 0.39mm/px · z∈[-174,-110]mm · 3 of 321 slices shown]
[im 81/321  lung]
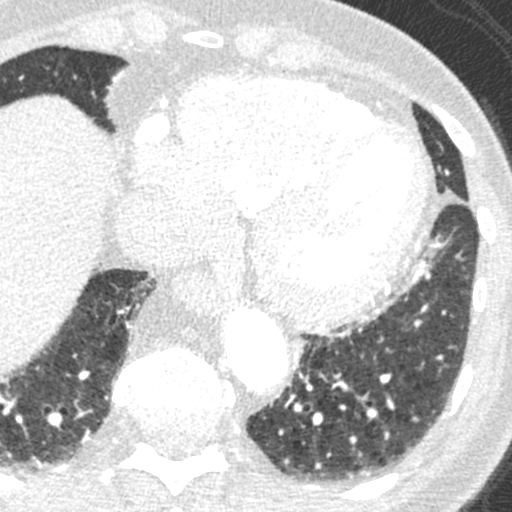
[im 161/321  lung]
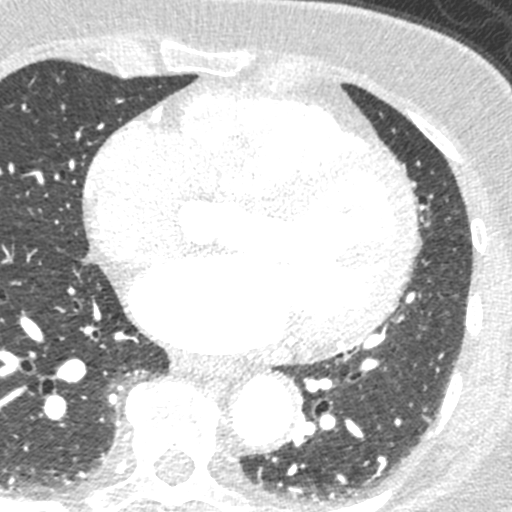
[im 241/321  lung]
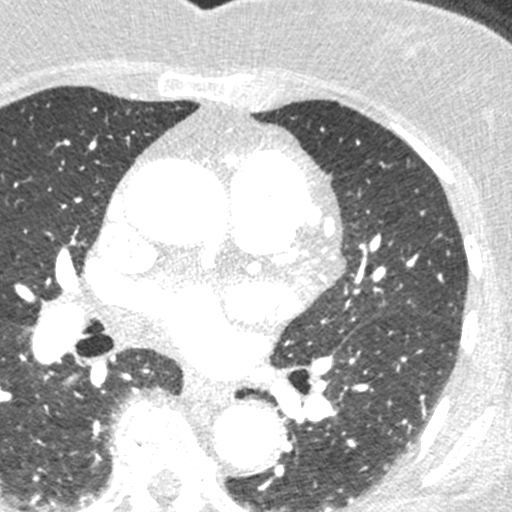

[12 of 20 positions shown; findings below may reference images not displayed]

FINDINGS: FFRct analysis was performed on the original cardiac CT angiogram
dataset. Diagrammatic representation of the FFRct analysis is
provided in a separate PDF document in PACS. This dictation was
created using the PDF document and an interactive 3D model of the
results. 3D model is not available in the EMR/PACS. Normal FFR range
is >0.80.

1. Left Main: No significant stenosis.

2. LAD: 0.95; no significant stenosis.
3. LCX: 0.85; no significant stenosis.
4. RCA: 0.93; no significant stenosis.
IMPRESSION: 1.  CT FFR analysis didn't show any significant stenosis.

## 2021-02-22 ENCOUNTER — Telehealth: Payer: Self-pay | Admitting: Orthopaedic Surgery

## 2021-02-22 NOTE — Telephone Encounter (Signed)
Patient submitted medical release form, FMLA papers for daughter to care for her after surgery, and $25.00 cash payment to [employer]. Accepted 02/22/21

## 2021-02-24 NOTE — Pre-Procedure Instructions (Signed)
Surgical Instructions:    Your procedure is scheduled on Monday, June 6th (07:15 AM- 09:38 AM).  Report to [facility] Main Entrance "A" at 05:30 A.M., then check in with the Admitting office.  Call this number if you have any questions prior to, or have any problems the morning of surgery:  [phone]    Remember:  Do not eat after midnight the night before your surgery.  You may drink clear liquids until 04:30 AM the morning of your surgery.   Clear liquids allowed are: Water, Non-Citrus Juices (without pulp), Carbonated Beverages, Clear Tea, Black Coffee Only, and Gatorade.   Enhanced Recovery after Surgery for Orthopedics Enhanced Recovery after Surgery is a protocol used to improve the stress on your body and your recovery after surgery.  Patient Instructions  . The day of surgery (if you do NOT have diabetes):  o Drink ONE (1) Pre-Surgery Clear Ensure by 04:30 AM the morning of surgery.   o This drink was given to you during your hospital pre-op appointment visit. o Nothing else to drink after completing the Pre-Surgery Clear Ensure.         If you have questions, please contact your surgeon's office.     Take these medicines the morning of surgery with A SIP OF WATER: metoprolol succinate (TOPROL XL)  simvastatin (ZOCOR)   IF NEEDED: cetirizine (ZYRTEC) cycloSPORINE (RESTASIS) eye drops azelastine (ASTELIN) nasal spray fluticasone (FLONASE) nasal spray   Follow your surgeon's instructions on when to stop apixaban (ELIQUIS) and Aspirin.  If no instructions were given by your surgeon then you will need to call the office to get those instructions.     As of today, STOP taking any Aleve, Naproxen, Ibuprofen, Motrin, Advil, Goody's, BC's, all herbal medications, fish oil, and all vitamins.              Special instructions:   [place]- Preparing For Surgery  Before surgery, you can play an important role. Because skin is not sterile, your skin needs to be as  free of germs as possible. You can reduce the number of germs on your skin by washing with CHG (chlorahexidine gluconate) Soap before surgery.  CHG is an antiseptic cleaner which kills germs and bonds with the skin to continue killing germs even after washing.    Oral Hygiene is also important to reduce your risk of infection.  Remember - BRUSH YOUR TEETH THE MORNING OF SURGERY WITH YOUR REGULAR TOOTHPASTE  Please do not use if you have an allergy to CHG or antibacterial soaps. If your skin becomes reddened/irritated stop using the CHG.  Do not shave (including legs and underarms) for at least 48 hours prior to first CHG shower. It is OK to shave your face.  Please follow these instructions carefully.   1. Shower the NIGHT BEFORE SURGERY and the MORNING OF SURGERY  2. If you chose to wash your hair, wash your hair first as usual with your normal shampoo.  3. After you shampoo, rinse your hair and body thoroughly to remove the shampoo.  4. Wash Face and genitals (private parts) with your normal soap.   5. Use CHG Soap as you would any other liquid soap. You can apply CHG directly to the skin and wash gently with a scrungie or a clean washcloth.   6. Apply the CHG Soap to your body ONLY FROM THE NECK DOWN.  Do not use on open wounds or open sores. Avoid contact with your eyes, ears, mouth  and genitals (private parts). Wash Face and genitals (private parts)  with your normal soap.   7. Wash thoroughly, paying special attention to the area where your surgery will be performed.  8. Thoroughly rinse your body with warm water from the neck down.  9. DO NOT shower/wash with your normal soap after using and rinsing off the CHG Soap.  10. Pat yourself dry with a CLEAN TOWEL.  11. Wear CLEAN PAJAMAS to bed the night before surgery.  12. Place CLEAN SHEETS on your bed the night before your surgery.  13. DO NOT SLEEP WITH PETS.   Day of Surgery: SHOWER with CHG soap. Brush your teeth WITH  YOUR REGULAR TOOTHPASTE. Wear Clean/Comfortable clothing the morning of surgery. Do not apply any deodorants/lotions.   Do not wear jewelry, make up, or nail polish. Do not shave 48 hours prior to surgery.  Do not bring valuables to the hospital. [facility] is not responsible for any belongings or valuables.   Do NOT Smoke (Tobacco/Vaping) or drink Alcohol 24 hours prior to your procedure.  If you use a CPAP at night, you may bring all equipment for your overnight stay.   Contacts, glasses, or dentures may not be worn into surgery, please bring cases for these belongings.   For patients admitted to the hospital, discharge time will be determined by your treatment team.   Patients discharged the day of surgery will not be allowed to drive home, and someone needs to stay with them for 24 hours.    Please read over the following fact sheets that you were given.

## 2021-02-25 ENCOUNTER — Other Ambulatory Visit: Payer: Self-pay

## 2021-02-25 ENCOUNTER — Encounter (HOSPITAL_COMMUNITY)
Admission: RE | Admit: 2021-02-25 | Discharge: 2021-02-25 | Disposition: A | Discharge status: Home / Self Care | Payer: Medicare HMO | Source: Ambulatory Visit | Attending: Orthopaedic Surgery | Admitting: Orthopaedic Surgery

## 2021-02-25 ENCOUNTER — Encounter (HOSPITAL_COMMUNITY): Payer: Self-pay

## 2021-02-25 ENCOUNTER — Other Ambulatory Visit: Payer: Self-pay | Admitting: Physician Assistant

## 2021-02-25 DIAGNOSIS — Z01818 Encounter for other preprocedural examination: Secondary | ICD-10-CM | POA: Diagnosis present

## 2021-02-25 DIAGNOSIS — M1711 Unilateral primary osteoarthritis, right knee: Secondary | ICD-10-CM | POA: Diagnosis not present

## 2021-02-25 DIAGNOSIS — I452 Bifascicular block: Secondary | ICD-10-CM | POA: Diagnosis not present

## 2021-02-25 HISTORY — DX: Cardiac arrhythmia, unspecified: I49.9

## 2021-02-25 LAB — CBC WITH DIFFERENTIAL/PLATELET
Abs Immature Granulocytes: 0.02 10*3/uL (ref 0.00–0.07)
Basophils Absolute: 0 10*3/uL (ref 0.0–0.1)
Basophils Relative: 0 %
Eosinophils Absolute: 0.2 10*3/uL (ref 0.0–0.5)
Eosinophils Relative: 2 %
HCT: 42.5 % (ref 36.0–46.0)
Hemoglobin: 13.8 g/dL (ref 12.0–15.0)
Immature Granulocytes: 0 %
Lymphocytes Relative: 35 %
Lymphs Abs: 2.6 10*3/uL (ref 0.7–4.0)
MCH: 31.4 pg (ref 26.0–34.0)
MCHC: 32.5 g/dL (ref 30.0–36.0)
MCV: 96.8 fL (ref 80.0–100.0)
Monocytes Absolute: 0.5 10*3/uL (ref 0.1–1.0)
Monocytes Relative: 7 %
Neutro Abs: 4.1 10*3/uL (ref 1.7–7.7)
Neutrophils Relative %: 56 %
Platelets: 240 10*3/uL (ref 150–400)
RBC: 4.39 MIL/uL (ref 3.87–5.11)
RDW: 13.2 % (ref 11.5–15.5)
WBC: 7.4 10*3/uL (ref 4.0–10.5)
nRBC: 0 % (ref 0.0–0.2)

## 2021-02-25 LAB — URINALYSIS, ROUTINE W REFLEX MICROSCOPIC
Bacteria, UA: NONE SEEN
Bilirubin Urine: NEGATIVE
Glucose, UA: NEGATIVE mg/dL
Hgb urine dipstick: NEGATIVE
Ketones, ur: NEGATIVE mg/dL
Leukocytes,Ua: NEGATIVE
Nitrite: POSITIVE — AB
Protein, ur: NEGATIVE mg/dL
Specific Gravity, Urine: 1.018 (ref 1.005–1.030)
pH: 5 (ref 5.0–8.0)

## 2021-02-25 LAB — COMPREHENSIVE METABOLIC PANEL
ALT: 17 U/L (ref 0–44)
AST: 19 U/L (ref 15–41)
Albumin: 3.8 g/dL (ref 3.5–5.0)
Alkaline Phosphatase: 40 U/L (ref 38–126)
Anion gap: 8 (ref 5–15)
BUN: 25 mg/dL — ABNORMAL HIGH (ref 8–23)
CO2: 27 mmol/L (ref 22–32)
Calcium: 10 mg/dL (ref 8.9–10.3)
Chloride: 105 mmol/L (ref 98–111)
Creatinine, Ser: 0.97 mg/dL (ref 0.44–1.00)
GFR, Estimated: >60 mL/min (ref >60)
Glucose, Bld: 82 mg/dL (ref 70–99)
Potassium: 4.2 mmol/L (ref 3.5–5.1)
Sodium: 140 mmol/L (ref 135–145)
Total Bilirubin: 0.6 mg/dL (ref 0.3–1.2)
Total Protein: 6.9 g/dL (ref 6.5–8.1)

## 2021-02-25 LAB — PROTIME-INR
INR: 1 (ref 0.8–1.2)
Prothrombin Time: 12.8 seconds (ref 11.4–15.2)

## 2021-02-25 LAB — TYPE AND SCREEN
ABO/RH(D): O POS
Antibody Screen: NEGATIVE

## 2021-02-25 LAB — APTT: aPTT: 30 seconds (ref 24–36)

## 2021-02-25 LAB — SURGICAL PCR SCREEN
MRSA, PCR: NEGATIVE
Staphylococcus aureus: NEGATIVE

## 2021-02-25 MED ORDER — CIPROFLOXACIN HCL 500 MG PO TABS: 500.0000 mg | ORAL_TABLET | Freq: Two times a day (BID) | ORAL | 0 refills | Status: AC

## 2021-02-25 NOTE — Progress Notes (Signed)
Message sent to Dr. Erlinda Hong regarding positive nitrates in urine.

## 2021-02-25 NOTE — Progress Notes (Signed)
Can you let patient know that she has a uti and I have called in abx that she needs to pick up and start taking asap

## 2021-02-25 NOTE — Progress Notes (Signed)
Patient called and stated she spoke with both Dr. Audie Box (cards) and Dr. Phoebe Sharps offices.  Dr. Audie Box needed form to be sent to them.  I talked to [place], [place] and he is calling Dr. Phoebe Sharps office.

## 2021-02-25 NOTE — Progress Notes (Addendum)
PCP: L. Maren Reamer, MD Cardiologist: Eleonore Chiquito, MD  EKG: 02/25/21 CXR: 02/25/21 ECHO: 01/13/20 Stress Test: per patient around 2013 Cardiac Cath: denies  Fasting Blood Sugar- na Checks Blood Sugar__na_ times a day  OSA/CPAP: No  ASA: Yes, pt to call office for instructions Blood Thinners: Yes, Eliquis.  Pt to call office for instructions  Covid test 03/04/21  Anesthesia Review: Yes, hx afib on Eliquis.  Positive Nitrates in urine.  Message sent to Dr. Erlinda Hong.  Patient denies shortness of breath, fever, cough, and chest pain at PAT appointment.  Patient verbalized understanding of instructions provided today at the PAT appointment.  Patient asked to review instructions at home and day of surgery.

## 2021-02-26 ENCOUNTER — Telehealth: Payer: Self-pay

## 2021-02-26 ENCOUNTER — Telehealth: Payer: Self-pay | Admitting: Orthopaedic Surgery

## 2021-02-26 NOTE — Progress Notes (Signed)
I think liz spoke with her today.  No symptoms so we will not have her take abx

## 2021-02-26 NOTE — Telephone Encounter (Signed)
   Patient Name: Tonya [name]  DOB: 09/11/1946  MRN: [id number]   Primary Cardiologist: Evalina Field, MD  Chart reviewed as part of pre-operative protocol coverage. 75 year old female with history of nonobstructive CAD, PAF on Eliquis, HTN, HLD, and tobacco use who we are asked to provide risk recommendations on Eliquis and aspirin as well as to risk stratify.   Per office protocol, I will route to pharmacy for recommendations on Eliquis.    Christell Faith, PA-C 02/26/2021, 9:00 AM

## 2021-02-26 NOTE — Anesthesia Preprocedure Evaluation (Addendum)
Anesthesia Evaluation  Patient identified by MRN, date of birth, ID band Patient awake    Reviewed: Allergy & Precautions, NPO status , Patient's Chart, lab work & pertinent test results, reviewed documented beta blocker date and time   History of Anesthesia Complications Negative for: history of anesthetic complications  Airway Mallampati: II  TM Distance: >3 FB Neck ROM: Full    Dental  (+) Dental Advisory Given, Teeth Intact   Pulmonary Current Smoker,    Pulmonary exam normal        Cardiovascular hypertension, Pt. on home beta blockers and Pt. on medications + CAD (nonobstructive)  Normal cardiovascular exam+ dysrhythmias Atrial Fibrillation      Neuro/Psych negative neurological ROS     GI/Hepatic negative GI ROS, Neg liver ROS,   Endo/Other  negative endocrine ROS  Renal/GU negative Renal ROS  negative genitourinary   Musculoskeletal  (+) Arthritis , Osteoarthritis,    Abdominal   Peds  Hematology Eliquis- last dose 03/04/21   Anesthesia Other Findings  Cardiology Clearance per telephone encounter 02/26/2021, "Chart reviewed as part of pre-operative protocol coverage. Patient was contacted 02/26/2021 in reference to pre-operative risk assessment for pending surgery as outlined below.  Tonya [name] was last seen on 08/04/2021 by Dr. Audie Box.  Since that day, Tonya [name] has done done well from a cardiac perspective and been without angina or symptoms of decompensation. She is able to achieve > 4 METs without cardiac limitation with her main limiting factor at this time being her knee.  Therefore, based on ACC/AHA guidelines, the patient would be at acceptable risk for the planned procedure without further cardiovascular testing."  Per pharmacy she can hold Eliquis 3 days prior to surgery.  Cardiology also cleared her to stop aspirin 5 days prior to procedure.  Reproductive/Obstetrics                           Anesthesia Physical Anesthesia Plan  ASA: III  Anesthesia Plan: Spinal   Post-op Pain Management:    Induction:   PONV Risk Score and Plan: 2 and Propofol infusion, Treatment may vary due to age or medical condition, Ondansetron and TIVA  Airway Management Planned: Nasal Cannula and Simple Face Mask  Additional Equipment: None  Intra-op Plan:   Post-operative Plan:   Informed Consent: I have reviewed the patients History and Physical, chart, labs and discussed the procedure including the risks, benefits and alternatives for the proposed anesthesia with the patient or authorized representative who has indicated his/her understanding and acceptance.       Plan Discussed with:   Anesthesia Plan Comments: (PAT note by Karoline Caldwell, PA-C: Follows with cardiology for history of paroxysmal A. fib on Eliquis, nonobstructive CAD, HTN, HLD.  She had a coronary CTA with elevated coronary calcium score but nonobstructive CAD with negative FFR.  Clearance per telephone encounter 02/26/2021, "Chart reviewed as part of pre-operative protocol coverage. Patient was contacted 02/26/2021 in reference to pre-operative risk assessment for pending surgery as outlined below.  Tonya [name] was last seen on 08/04/2021 by Dr. Audie Box.  Since that day, Tonya [name] has done done well from a cardiac perspective and been without angina or symptoms of decompensation. She is able to achieve > 4 METs without cardiac limitation with her main limiting factor at this time being her knee.  Therefore, based on ACC/AHA guidelines, the patient would be at acceptable risk for the planned procedure without further cardiovascular  testing."  Per pharmacy she can hold Eliquis 3 days prior to surgery.  Cardiology also cleared her to stop aspirin 5 days prior to procedure.  Preop labs reviewed, unremarkable.  EKG 02/25/2021: Sinus bradycardia.  Rate 47.  Possible LAE.  RBBB.  LAFB.   No significant change since last tracing.  TTE 01/13/2020: 1. Left ventricular ejection fraction, by estimation, is 50 to 55%. The  left ventricle has low normal function. The left ventricle has no regional  wall motion abnormalities. Left ventricular diastolic parameters are  consistent with Grade I diastolic  dysfunction (impaired relaxation).  2. Right ventricular systolic function is normal. The right ventricular  size is normal. There is moderately elevated pulmonary artery systolic  pressure.  3. Left atrial size was mildly dilated.  4. The mitral valve is normal in structure. Mild mitral valve  regurgitation. No evidence of mitral stenosis.  5. Tricuspid valve regurgitation is mild to moderate.  6. The aortic valve is normal in structure. Aortic valve regurgitation is  trivial. No aortic stenosis is present.  7. The inferior vena cava is dilated in size with <50% respiratory  variability, suggesting right atrial pressure of 15 mmHg.   Coronary CT 07/09/2019: IMPRESSION: 1. Coronary calcium score of 1022. This was 97th percentile for age and sex matched control.  2. Normal coronary origin with right dominance.  3. Mild calcified CAD in the LAD and LCX (25-49%) that is non-obstructive.  4. Likely mild calcified CAD in the mRCA that is non-obstructive but cannot exclude a moderate stenosis.  5. Moderate aortic atherosclerosis.  IMPRESSION: 1.  CT FFR analysis didn't show any significant stenosis. )      Anesthesia Quick Evaluation

## 2021-02-26 NOTE — Telephone Encounter (Signed)
Called pt and advised. She stated understanding  

## 2021-02-26 NOTE — Telephone Encounter (Signed)
If she's not having any urinary symptoms then she doesn't need to be on an antibiotic.  Likely false positive.

## 2021-02-26 NOTE — Telephone Encounter (Signed)
   [facility] Pre-operative Risk Assessment    Patient Name: Tonya [name]  DOB: 04-Oct-1945  MRN: [id number]  Request for surgical clearance:  1. What type of surgery is being performed? RIGHT TOTAL KNEE ARTHROPLASTY   2. When is this surgery scheduled? 03-08-2021   3. What type of clearance is required (medical clearance vs. Pharmacy clearance to hold med vs. Both)? BOTH  4. Are there any medications that need to be held prior to surgery and how long? ELIQUIS ASA   5. Practice name and name of physician performing surgery? [facility] Eduard Roux, MD   6. What is the office phone number? [phone]   7.   What is the office fax number? [phone]  8.   Anesthesia type (None, local, MAC, general) ? SPINAL & BLOCK

## 2021-02-26 NOTE — Telephone Encounter (Signed)
   Name: Tonya [name]  DOB: 20-Jan-1946  MRN: [id number]   Primary Cardiologist: Evalina Field, MD  Chart reviewed as part of pre-operative protocol coverage. Patient was contacted 02/26/2021 in reference to pre-operative risk assessment for pending surgery as outlined below.  Tonya [name] was last seen on 08/04/2021 by Dr. Audie Box.  Since that day, Tonya [name] has done done well from a cardiac perspective and been without angina or symptoms of decompensation. She is able to achieve > 4 METs without cardiac limitation with her main limiting factor at this time being her knee.   Therefore, based on ACC/AHA guidelines, the patient would be at acceptable risk for the planned procedure without further cardiovascular testing.   With regards to her Eliquis, her chart was reviewed by our pharmacy team, per protocol: Procedure: right TKA Date of procedure: 03/08/21  CHA2DS2-VASc Score = 4  This indicates a 4.8% annual risk of stroke. The patient's score is based upon: CHF History: No HTN History: Yes Diabetes History: No Stroke History: No Vascular Disease History: Yes Age Score: 1 Gender Score: 1   CrCl 52mL/min Platelet count 240K  Per office protocol, patient can hold Eliquis for 3 days prior to procedure.            She may stop aspirin 5 days prior to her procedure. Resumption of these medications is deferred to her surgical team, and should be resumed as soon as safely possible. These recommendations were discussed with the patient in detail.   The patient was advised that if she develops new symptoms prior to surgery to contact our office to arrange for a follow-up visit, and she verbalized understanding.  I will route this recommendation to the requesting party via Epic fax function and remove from pre-op pool. Please call with questions.  Christell Faith, PA-C 02/26/2021, 11:51 AM

## 2021-02-26 NOTE — Telephone Encounter (Signed)
Pt called stating she tested positive for a UTI but she isn't showiung any symptoms so she would like to know if she can have the test redone so it doesn't push her 03/08/21 surgery back. Pt would like a CB ASAP please.   [phone]

## 2021-02-26 NOTE — Telephone Encounter (Signed)
She's supposed to stop it 3 days before surgery

## 2021-02-26 NOTE — Telephone Encounter (Signed)
Patient would like to know if she has to stop Eliquis?

## 2021-02-26 NOTE — Progress Notes (Signed)
Anesthesia Chart Review:  Follows with cardiology for history of paroxysmal A. fib on Eliquis, nonobstructive CAD, HTN, HLD.  She had a coronary CTA with elevated coronary calcium score but nonobstructive CAD with negative FFR.  Clearance per telephone encounter 02/26/2021, "Chart reviewed as part of pre-operative protocol coverage. Patient was contacted 02/26/2021 in reference to pre-operative risk assessment for pending surgery as outlined below.  Tonya [name] was last seen on 08/04/2021 by Dr. Audie Box.  Since that day, Tonya [name] has done done well from a cardiac perspective and been without angina or symptoms of decompensation. She is able to achieve > 4 METs without cardiac limitation with her main limiting factor at this time being her knee.  Therefore, based on ACC/AHA guidelines, the patient would be at acceptable risk for the planned procedure without further cardiovascular testing."  Per pharmacy she can hold Eliquis 3 days prior to surgery.  Cardiology also cleared her to stop aspirin 5 days prior to procedure.  Preop labs reviewed, unremarkable.  EKG 02/25/2021: Sinus bradycardia.  Rate 47.  Possible LAE.  RBBB.  LAFB.  No significant change since last tracing.  TTE 01/13/2020: 1. Left ventricular ejection fraction, by estimation, is 50 to 55%. The  left ventricle has low normal function. The left ventricle has no regional  wall motion abnormalities. Left ventricular diastolic parameters are  consistent with Grade I diastolic  dysfunction (impaired relaxation).  2. Right ventricular systolic function is normal. The right ventricular  size is normal. There is moderately elevated pulmonary artery systolic  pressure.  3. Left atrial size was mildly dilated.  4. The mitral valve is normal in structure. Mild mitral valve  regurgitation. No evidence of mitral stenosis.  5. Tricuspid valve regurgitation is mild to moderate.  6. The aortic valve is normal in structure. Aortic valve  regurgitation is  trivial. No aortic stenosis is present.  7. The inferior vena cava is dilated in size with <50% respiratory  variability, suggesting right atrial pressure of 15 mmHg.   Coronary CT 07/09/2019: IMPRESSION: 1. Coronary calcium score of 1022. This was 97th percentile for age and sex matched control.  2. Normal coronary origin with right dominance.  3. Mild calcified CAD in the LAD and LCX (25-49%) that is non-obstructive.  4. Likely mild calcified CAD in the mRCA that is non-obstructive but cannot exclude a moderate stenosis.  5. Moderate aortic atherosclerosis.  IMPRESSION: 1.  CT FFR analysis didn't show any significant stenosis.   Wynonia Musty [facility]/Anesthesiology Phone [phone] 02/26/2021 3:35 PM

## 2021-02-26 NOTE — Telephone Encounter (Signed)
Ok, thanks.

## 2021-02-26 NOTE — Telephone Encounter (Signed)
Patient with diagnosis of afib on Eliquis for anticoagulation.    Procedure: right TKA Date of procedure: 03/08/21  CHA2DS2-VASc Score = 4  This indicates a 4.8% annual risk of stroke. The patient's score is based upon: CHF History: No HTN History: Yes Diabetes History: No Stroke History: No Vascular Disease History: Yes Age Score: 1 Gender Score: 1   CrCl 4mL/min Platelet count 240K  Per office protocol, patient can hold Eliquis for 3 days prior to procedure.

## 2021-03-03 ENCOUNTER — Other Ambulatory Visit: Payer: Self-pay | Admitting: Physician Assistant

## 2021-03-03 ENCOUNTER — Telehealth: Payer: Self-pay | Admitting: Orthopaedic Surgery

## 2021-03-03 MED ORDER — METHOCARBAMOL 500 MG PO TABS: 500.0000 mg | ORAL_TABLET | Freq: Two times a day (BID) | ORAL | 0 refills | Status: DC | PRN

## 2021-03-03 MED ORDER — ONDANSETRON HCL 4 MG PO TABS: 4.0000 mg | ORAL_TABLET | Freq: Three times a day (TID) | ORAL | 0 refills | Status: DC | PRN

## 2021-03-03 MED ORDER — OXYCODONE-ACETAMINOPHEN 5-325 MG PO TABS: 1.0000 | ORAL_TABLET | Freq: Four times a day (QID) | ORAL | 0 refills | Status: DC | PRN

## 2021-03-03 MED ORDER — DOCUSATE SODIUM 100 MG PO CAPS: 100.0000 mg | ORAL_CAPSULE | Freq: Every day | ORAL | 2 refills | Status: AC | PRN

## 2021-03-03 NOTE — Telephone Encounter (Signed)
Please advise 

## 2021-03-03 NOTE — Telephone Encounter (Signed)
Patient called. says she can not take percocet. It was on her list of meds to take after surgery. Would like that changed.  Her call back number is [phone]

## 2021-03-04 ENCOUNTER — Other Ambulatory Visit (HOSPITAL_COMMUNITY)
Admission: RE | Admit: 2021-03-04 | Discharge: 2021-03-04 | Disposition: A | Discharge status: Home / Self Care | Payer: Medicare HMO | Source: Ambulatory Visit | Attending: Orthopaedic Surgery | Admitting: Orthopaedic Surgery

## 2021-03-04 ENCOUNTER — Other Ambulatory Visit: Payer: Self-pay | Admitting: Physician Assistant

## 2021-03-04 DIAGNOSIS — Z20822 Contact with and (suspected) exposure to covid-19: Secondary | ICD-10-CM | POA: Insufficient documentation

## 2021-03-04 DIAGNOSIS — Z01812 Encounter for preprocedural laboratory examination: Secondary | ICD-10-CM | POA: Insufficient documentation

## 2021-03-04 LAB — SARS CORONAVIRUS 2 (TAT 6-24 HRS): SARS Coronavirus 2: NEGATIVE

## 2021-03-04 MED ORDER — HYDROCODONE-ACETAMINOPHEN 5-325 MG PO TABS: 1.0000 | ORAL_TABLET | Freq: Four times a day (QID) | ORAL | 0 refills | Status: DC | PRN

## 2021-03-04 NOTE — Telephone Encounter (Signed)
Could you let me know what pain medication that she can tolerate?  Mobic and penicillin only allergies on list.

## 2021-03-04 NOTE — Telephone Encounter (Signed)
Called.

## 2021-03-04 NOTE — Telephone Encounter (Signed)
I called and talked to pt. She would like to try hydrocodone for postop pain

## 2021-03-04 NOTE — Telephone Encounter (Signed)
[place], thanks.  I just sent in. Can you call pharmacy and cancel the percocet?

## 2021-03-05 MED ORDER — TRANEXAMIC ACID 1000 MG/10ML IV SOLN
2000.0000 mg | INTRAVENOUS | Status: DC
  Filled 2021-03-05: qty 20

## 2021-03-08 ENCOUNTER — Ambulatory Visit (HOSPITAL_COMMUNITY): Payer: Medicare HMO | Admitting: Anesthesiology

## 2021-03-08 ENCOUNTER — Encounter (HOSPITAL_COMMUNITY): Payer: Self-pay | Admitting: Orthopaedic Surgery

## 2021-03-08 ENCOUNTER — Ambulatory Visit (HOSPITAL_COMMUNITY): Payer: Medicare HMO | Admitting: Physician Assistant

## 2021-03-08 ENCOUNTER — Encounter (HOSPITAL_COMMUNITY)
Admission: RE | Disposition: A | Discharge status: Home / Self Care | Payer: Self-pay | Source: Home / Self Care | Attending: Orthopaedic Surgery

## 2021-03-08 ENCOUNTER — Observation Stay (HOSPITAL_COMMUNITY)
Admission: RE | Admit: 2021-03-08 | Discharge: 2021-03-09 | Disposition: A | Discharge status: Home / Self Care | Payer: Medicare HMO | Attending: Orthopaedic Surgery | Admitting: Orthopaedic Surgery

## 2021-03-08 ENCOUNTER — Observation Stay (HOSPITAL_COMMUNITY): Payer: Medicare HMO

## 2021-03-08 ENCOUNTER — Other Ambulatory Visit: Payer: Self-pay

## 2021-03-08 DIAGNOSIS — I10 Essential (primary) hypertension: Secondary | ICD-10-CM | POA: Insufficient documentation

## 2021-03-08 DIAGNOSIS — M1711 Unilateral primary osteoarthritis, right knee: Principal | ICD-10-CM | POA: Insufficient documentation

## 2021-03-08 DIAGNOSIS — Z96651 Presence of right artificial knee joint: Secondary | ICD-10-CM

## 2021-03-08 DIAGNOSIS — R52 Pain, unspecified: Secondary | ICD-10-CM

## 2021-03-08 DIAGNOSIS — Z79899 Other long term (current) drug therapy: Secondary | ICD-10-CM | POA: Diagnosis not present

## 2021-03-08 DIAGNOSIS — F17210 Nicotine dependence, cigarettes, uncomplicated: Secondary | ICD-10-CM | POA: Diagnosis not present

## 2021-03-08 DIAGNOSIS — Z7982 Long term (current) use of aspirin: Secondary | ICD-10-CM | POA: Insufficient documentation

## 2021-03-08 HISTORY — PX: TOTAL KNEE ARTHROPLASTY: SHX125

## 2021-03-08 LAB — ABO/RH: ABO/RH(D): O POS

## 2021-03-08 SURGERY — ARTHROPLASTY, KNEE, TOTAL
Anesthesia: Spinal | Site: Knee | Laterality: Right

## 2021-03-08 MED ORDER — ONDANSETRON HCL 4 MG/2ML IJ SOLN
INTRAMUSCULAR | Status: DC | PRN
  Administered 2021-03-08: 4 mg via INTRAVENOUS

## 2021-03-08 MED ORDER — MIDAZOLAM HCL 5 MG/5ML IJ SOLN
INTRAMUSCULAR | Status: DC | PRN
  Administered 2021-03-08: 2 mg via INTRAVENOUS

## 2021-03-08 MED ORDER — BUPIVACAINE-MELOXICAM ER 400-12 MG/14ML IJ SOLN
INTRAMUSCULAR | Status: AC
  Filled 2021-03-08: qty 1

## 2021-03-08 MED ORDER — CHLORHEXIDINE GLUCONATE 0.12 % MT SOLN
OROMUCOSAL | Status: AC
  Administered 2021-03-08: 15 mL
  Filled 2021-03-08: qty 15

## 2021-03-08 MED ORDER — ACETAMINOPHEN 325 MG PO TABS: 325.0000 mg | ORAL_TABLET | Freq: Four times a day (QID) | ORAL | Status: DC | PRN

## 2021-03-08 MED ORDER — METOPROLOL SUCCINATE ER 25 MG PO TB24
25.0000 mg | ORAL_TABLET | Freq: Every day | ORAL | Status: DC
  Filled 2021-03-08: qty 1

## 2021-03-08 MED ORDER — DEXAMETHASONE SODIUM PHOSPHATE 10 MG/ML IJ SOLN
INTRAMUSCULAR | Status: DC | PRN
  Administered 2021-03-08: 10 mg via INTRAVENOUS

## 2021-03-08 MED ORDER — DIPHENHYDRAMINE HCL 12.5 MG/5ML PO ELIX: 25.0000 mg | ORAL_SOLUTION | ORAL | Status: DC | PRN

## 2021-03-08 MED ORDER — PROPOFOL 10 MG/ML IV BOLUS
INTRAVENOUS | Status: AC
  Filled 2021-03-08: qty 20

## 2021-03-08 MED ORDER — SODIUM CHLORIDE 0.9 % IR SOLN
Status: DC | PRN
  Administered 2021-03-08: 1000 mL

## 2021-03-08 MED ORDER — METOCLOPRAMIDE HCL 5 MG/ML IJ SOLN: 5.0000 mg | Freq: Three times a day (TID) | INTRAMUSCULAR | Status: DC | PRN

## 2021-03-08 MED ORDER — FENTANYL CITRATE (PF) 100 MCG/2ML IJ SOLN
INTRAMUSCULAR | Status: DC | PRN
  Administered 2021-03-08: 50 ug via INTRAVENOUS

## 2021-03-08 MED ORDER — ONDANSETRON HCL 4 MG/2ML IJ SOLN: 4.0000 mg | Freq: Once | INTRAMUSCULAR | Status: DC | PRN

## 2021-03-08 MED ORDER — AMISULPRIDE (ANTIEMETIC) 5 MG/2ML IV SOLN: 10.0000 mg | Freq: Once | INTRAVENOUS | Status: DC | PRN

## 2021-03-08 MED ORDER — OXYCODONE HCL ER 10 MG PO T12A
10.0000 mg | EXTENDED_RELEASE_TABLET | Freq: Two times a day (BID) | ORAL | Status: DC
Start: 2021-03-08 — End: 2021-03-09
  Administered 2021-03-08 – 2021-03-09 (×3): 10 mg via ORAL
  Filled 2021-03-08 (×3): qty 1

## 2021-03-08 MED ORDER — SORBITOL 70 % SOLN
30.0000 mL | Freq: Every day | Status: DC | PRN
  Filled 2021-03-08: qty 30

## 2021-03-08 MED ORDER — ONDANSETRON HCL 4 MG PO TABS: 4.0000 mg | ORAL_TABLET | Freq: Four times a day (QID) | ORAL | Status: DC | PRN

## 2021-03-08 MED ORDER — DOCUSATE SODIUM 100 MG PO CAPS
100.0000 mg | ORAL_CAPSULE | Freq: Two times a day (BID) | ORAL | Status: DC
  Administered 2021-03-08 (×2): 100 mg via ORAL
  Filled 2021-03-08 (×3): qty 1

## 2021-03-08 MED ORDER — MIDAZOLAM HCL 2 MG/2ML IJ SOLN
INTRAMUSCULAR | Status: AC
  Filled 2021-03-08: qty 2

## 2021-03-08 MED ORDER — PHENYLEPHRINE 40 MCG/ML (10ML) SYRINGE FOR IV PUSH (FOR BLOOD PRESSURE SUPPORT)
PREFILLED_SYRINGE | INTRAVENOUS | Status: DC | PRN
  Administered 2021-03-08 (×2): 80 ug via INTRAVENOUS

## 2021-03-08 MED ORDER — OXYCODONE HCL 5 MG/5ML PO SOLN: 5.0000 mg | Freq: Once | ORAL | Status: DC | PRN

## 2021-03-08 MED ORDER — POVIDONE-IODINE 10 % EX SWAB
2.0000 "application " | Freq: Once | CUTANEOUS | Status: AC
  Administered 2021-03-08: 2 via TOPICAL

## 2021-03-08 MED ORDER — OXYCODONE HCL 5 MG PO TABS: 10.0000 mg | ORAL_TABLET | ORAL | Status: DC | PRN

## 2021-03-08 MED ORDER — FENTANYL CITRATE (PF) 100 MCG/2ML IJ SOLN: 25.0000 ug | INTRAMUSCULAR | Status: DC | PRN

## 2021-03-08 MED ORDER — ONDANSETRON HCL 4 MG/2ML IJ SOLN
INTRAMUSCULAR | Status: AC
  Filled 2021-03-08: qty 2

## 2021-03-08 MED ORDER — OXYCODONE HCL 5 MG PO TABS: 5.0000 mg | ORAL_TABLET | Freq: Once | ORAL | Status: DC | PRN

## 2021-03-08 MED ORDER — MAGNESIUM CITRATE PO SOLN: 1.0000 | Freq: Once | ORAL | Status: DC | PRN

## 2021-03-08 MED ORDER — CEFAZOLIN SODIUM-DEXTROSE 2-4 GM/100ML-% IV SOLN
2.0000 g | INTRAVENOUS | Status: AC
  Administered 2021-03-08: 2 g via INTRAVENOUS
  Filled 2021-03-08: qty 100

## 2021-03-08 MED ORDER — PHENYLEPHRINE HCL-NACL 10-0.9 MG/250ML-% IV SOLN
INTRAVENOUS | Status: DC | PRN
  Administered 2021-03-08: 25 ug/min via INTRAVENOUS

## 2021-03-08 MED ORDER — METHOCARBAMOL 500 MG PO TABS: 500.0000 mg | ORAL_TABLET | Freq: Four times a day (QID) | ORAL | Status: DC | PRN

## 2021-03-08 MED ORDER — VANCOMYCIN HCL 1000 MG IV SOLR
INTRAVENOUS | Status: DC | PRN
  Administered 2021-03-08: 1000 mg via TOPICAL

## 2021-03-08 MED ORDER — ONDANSETRON HCL 4 MG/2ML IJ SOLN
4.0000 mg | Freq: Four times a day (QID) | INTRAMUSCULAR | Status: DC | PRN
  Administered 2021-03-08: 4 mg via INTRAVENOUS
  Filled 2021-03-08: qty 2

## 2021-03-08 MED ORDER — PHENYLEPHRINE 40 MCG/ML (10ML) SYRINGE FOR IV PUSH (FOR BLOOD PRESSURE SUPPORT)
PREFILLED_SYRINGE | INTRAVENOUS | Status: AC
  Filled 2021-03-08: qty 10

## 2021-03-08 MED ORDER — LACTATED RINGERS IV SOLN: INTRAVENOUS | Status: DC

## 2021-03-08 MED ORDER — DEXAMETHASONE SODIUM PHOSPHATE 10 MG/ML IJ SOLN
INTRAMUSCULAR | Status: AC
  Filled 2021-03-08: qty 1

## 2021-03-08 MED ORDER — PHENOL 1.4 % MT LIQD
1.0000 | OROMUCOSAL | Status: DC | PRN
Start: 2021-03-08 — End: 2021-03-09

## 2021-03-08 MED ORDER — PANTOPRAZOLE SODIUM 40 MG PO TBEC
40.0000 mg | DELAYED_RELEASE_TABLET | Freq: Every day | ORAL | Status: DC
  Administered 2021-03-08 – 2021-03-09 (×2): 40 mg via ORAL
  Filled 2021-03-08 (×2): qty 1

## 2021-03-08 MED ORDER — PROPOFOL 500 MG/50ML IV EMUL
INTRAVENOUS | Status: DC | PRN
  Administered 2021-03-08: 75 ug/kg/min via INTRAVENOUS

## 2021-03-08 MED ORDER — GLYCOPYRROLATE PF 0.2 MG/ML IJ SOSY
PREFILLED_SYRINGE | INTRAMUSCULAR | Status: DC | PRN
  Administered 2021-03-08: .2 mg via INTRAVENOUS

## 2021-03-08 MED ORDER — CEFAZOLIN SODIUM-DEXTROSE 2-4 GM/100ML-% IV SOLN
2.0000 g | Freq: Four times a day (QID) | INTRAVENOUS | Status: AC
  Administered 2021-03-08 (×2): 2 g via INTRAVENOUS
  Filled 2021-03-08 (×3): qty 100

## 2021-03-08 MED ORDER — METOCLOPRAMIDE HCL 5 MG PO TABS: 5.0000 mg | ORAL_TABLET | Freq: Three times a day (TID) | ORAL | Status: DC | PRN

## 2021-03-08 MED ORDER — TRANEXAMIC ACID 1000 MG/10ML IV SOLN
INTRAVENOUS | Status: DC | PRN
  Administered 2021-03-08: 2000 mg via TOPICAL

## 2021-03-08 MED ORDER — OXYCODONE HCL 5 MG PO TABS
5.0000 mg | ORAL_TABLET | ORAL | Status: DC | PRN
  Administered 2021-03-09: 10 mg via ORAL
  Filled 2021-03-08: qty 2

## 2021-03-08 MED ORDER — METHOCARBAMOL 1000 MG/10ML IJ SOLN
500.0000 mg | Freq: Four times a day (QID) | INTRAMUSCULAR | Status: DC | PRN
  Filled 2021-03-08: qty 5

## 2021-03-08 MED ORDER — SODIUM CHLORIDE 0.9 % IV SOLN: INTRAVENOUS | Status: DC

## 2021-03-08 MED ORDER — ALUM & MAG HYDROXIDE-SIMETH 200-200-20 MG/5ML PO SUSP: 30.0000 mL | ORAL | Status: DC | PRN

## 2021-03-08 MED ORDER — VANCOMYCIN HCL 1000 MG IV SOLR
INTRAVENOUS | Status: AC
  Filled 2021-03-08: qty 1000

## 2021-03-08 MED ORDER — PROPOFOL 10 MG/ML IV BOLUS
INTRAVENOUS | Status: DC | PRN
  Administered 2021-03-08: 20 mg via INTRAVENOUS

## 2021-03-08 MED ORDER — POLYETHYLENE GLYCOL 3350 17 G PO PACK
17.0000 g | PACK | Freq: Every day | ORAL | Status: DC
  Filled 2021-03-08 (×2): qty 1

## 2021-03-08 MED ORDER — BUPIVACAINE-MELOXICAM ER 400-12 MG/14ML IJ SOLN
INTRAMUSCULAR | Status: DC | PRN
  Administered 2021-03-08: 400 mg

## 2021-03-08 MED ORDER — HYDROMORPHONE HCL 1 MG/ML IJ SOLN
0.5000 mg | INTRAMUSCULAR | Status: DC | PRN
  Administered 2021-03-08: 1 mg via INTRAVENOUS
  Filled 2021-03-08: qty 1

## 2021-03-08 MED ORDER — FENTANYL CITRATE (PF) 250 MCG/5ML IJ SOLN
INTRAMUSCULAR | Status: AC
  Filled 2021-03-08: qty 5

## 2021-03-08 MED ORDER — ACETAMINOPHEN 500 MG PO TABS
1000.0000 mg | ORAL_TABLET | Freq: Four times a day (QID) | ORAL | Status: AC
  Administered 2021-03-08 – 2021-03-09 (×4): 1000 mg via ORAL
  Filled 2021-03-08 (×4): qty 2

## 2021-03-08 MED ORDER — ROPIVACAINE HCL 5 MG/ML IJ SOLN
INTRAMUSCULAR | Status: DC | PRN
  Administered 2021-03-08: 30 mL via PERINEURAL

## 2021-03-08 MED ORDER — IRRISEPT - 450ML BOTTLE WITH 0.05% CHG IN STERILE WATER, USP 99.95% OPTIME
TOPICAL | Status: DC | PRN
  Administered 2021-03-08: 450 mL via TOPICAL

## 2021-03-08 MED ORDER — MENTHOL 3 MG MT LOZG: 1.0000 | LOZENGE | OROMUCOSAL | Status: DC | PRN

## 2021-03-08 MED ORDER — TRANEXAMIC ACID-NACL 1000-0.7 MG/100ML-% IV SOLN
1000.0000 mg | Freq: Once | INTRAVENOUS | Status: AC
  Administered 2021-03-08: 1000 mg via INTRAVENOUS
  Filled 2021-03-08: qty 100

## 2021-03-08 MED ORDER — BUPIVACAINE HCL (PF) 0.5 % IJ SOLN
INTRAMUSCULAR | Status: DC | PRN
  Administered 2021-03-08: 3 mL

## 2021-03-08 MED ORDER — APIXABAN 5 MG PO TABS
5.0000 mg | ORAL_TABLET | Freq: Two times a day (BID) | ORAL | Status: DC
  Administered 2021-03-09: 5 mg via ORAL
  Filled 2021-03-08: qty 1

## 2021-03-08 MED ORDER — 0.9 % SODIUM CHLORIDE (POUR BTL) OPTIME
TOPICAL | Status: DC | PRN
  Administered 2021-03-08: 1000 mL

## 2021-03-08 MED ORDER — TRANEXAMIC ACID-NACL 1000-0.7 MG/100ML-% IV SOLN
1000.0000 mg | INTRAVENOUS | Status: AC
  Administered 2021-03-08: 1000 mg via INTRAVENOUS
  Filled 2021-03-08: qty 100

## 2021-03-08 MED ORDER — LACTATED RINGERS IV SOLN: INTRAVENOUS | Status: DC | PRN

## 2021-03-08 SURGICAL SUPPLY — 77 items
ALCOHOL 70% 16 OZ (MISCELLANEOUS) ×2 IMPLANT
BAG DECANTER FOR FLEXI CONT (MISCELLANEOUS) ×2 IMPLANT
BANDAGE ESMARK 6X9 LF (GAUZE/BANDAGES/DRESSINGS) IMPLANT
BLADE SAG 18X100X1.27 (BLADE) ×2 IMPLANT
BNDG ESMARK 6X9 LF (GAUZE/BANDAGES/DRESSINGS)
BOWL SMART MIX CTS (DISPOSABLE) ×2 IMPLANT
CEMENT BONE REFOBACIN R1X40 US (Cement) ×4 IMPLANT
CLSR STERI-STRIP ANTIMIC 1/2X4 (GAUZE/BANDAGES/DRESSINGS) ×4 IMPLANT
COMP FEM CEMT PERS SZ7 RT (Joint) ×2 IMPLANT
COMPONENT FEM CEMT PERS SZ7 RT (Joint) ×1 IMPLANT
COOLER ICEMAN CLASSIC (MISCELLANEOUS) ×2 IMPLANT
COVER SURGICAL LIGHT HANDLE (MISCELLANEOUS) ×2 IMPLANT
COVER WAND RF STERILE (DRAPES) IMPLANT
CUFF TOURN SGL QUICK 34 (TOURNIQUET CUFF) ×1
CUFF TOURN SGL QUICK 42 (TOURNIQUET CUFF) IMPLANT
CUFF TRNQT CYL 34X4.125X (TOURNIQUET CUFF) ×1 IMPLANT
DERMABOND ADVANCED (GAUZE/BANDAGES/DRESSINGS) ×1
DERMABOND ADVANCED .7 DNX12 (GAUZE/BANDAGES/DRESSINGS) ×1 IMPLANT
DRAPE EXTREMITY T 121X128X90 (DISPOSABLE) ×2 IMPLANT
DRAPE HALF SHEET 40X57 (DRAPES) ×2 IMPLANT
DRAPE INCISE IOBAN 66X45 STRL (DRAPES) IMPLANT
DRAPE ORTHO SPLIT 77X108 STRL (DRAPES) ×2
DRAPE POUCH INSTRU U-SHP 10X18 (DRAPES) ×2 IMPLANT
DRAPE SURG ORHT 6 SPLT 77X108 (DRAPES) ×2 IMPLANT
DRAPE U-SHAPE 47X51 STRL (DRAPES) ×4 IMPLANT
DRSG AQUACEL AG ADV 3.5X10 (GAUZE/BANDAGES/DRESSINGS) ×2 IMPLANT
DURAPREP 26ML APPLICATOR (WOUND CARE) ×6 IMPLANT
ELECT CAUTERY BLADE 6.4 (BLADE) ×2 IMPLANT
ELECT REM PT RETURN 9FT ADLT (ELECTROSURGICAL) ×2
ELECTRODE REM PT RTRN 9FT ADLT (ELECTROSURGICAL) ×1 IMPLANT
GLOVE ECLIPSE 7.0 STRL STRAW (GLOVE) ×6 IMPLANT
GLOVE SKINSENSE NS SZ7.5 (GLOVE) ×3
GLOVE SKINSENSE STRL SZ7.5 (GLOVE) ×3 IMPLANT
GLOVE SURG SYN 7.5  E (GLOVE) ×4
GLOVE SURG SYN 7.5 E (GLOVE) ×4 IMPLANT
GLOVE SURG UNDER POLY LF SZ7 (GLOVE) ×2 IMPLANT
GOWN STRL REIN XL XLG (GOWN DISPOSABLE) ×2 IMPLANT
GOWN STRL REUS W/ TWL LRG LVL3 (GOWN DISPOSABLE) ×1 IMPLANT
GOWN STRL REUS W/TWL LRG LVL3 (GOWN DISPOSABLE) ×1
HANDPIECE INTERPULSE COAX TIP (DISPOSABLE) ×1
HDLS TROCR DRIL PIN KNEE 75 (PIN) ×1
HOOD PEEL AWAY FLYTE STAYCOOL (MISCELLANEOUS) ×4 IMPLANT
INSERT TIB ARTISURF SZ6-7 R 11 (Joint) ×2 IMPLANT
JET LAVAGE IRRISEPT WOUND (IRRIGATION / IRRIGATOR) ×2
KIT BASIN OR (CUSTOM PROCEDURE TRAY) ×2 IMPLANT
KIT TURNOVER KIT B (KITS) ×2 IMPLANT
LAVAGE JET IRRISEPT WOUND (IRRIGATION / IRRIGATOR) ×1 IMPLANT
MANIFOLD NEPTUNE II (INSTRUMENTS) ×2 IMPLANT
MARKER SKIN DUAL TIP RULER LAB (MISCELLANEOUS) ×2 IMPLANT
NEEDLE SPNL 18GX3.5 QUINCKE PK (NEEDLE) ×4 IMPLANT
NS IRRIG 1000ML POUR BTL (IV SOLUTION) ×2 IMPLANT
PACK TOTAL JOINT (CUSTOM PROCEDURE TRAY) ×2 IMPLANT
PAD ARMBOARD 7.5X6 YLW CONV (MISCELLANEOUS) ×4 IMPLANT
PAD COLD SHLDR WRAP-ON (PAD) ×2 IMPLANT
PIN DRILL HDLS TROCAR 75 4PK (PIN) ×1 IMPLANT
SAW OSC TIP CART 19.5X105X1.3 (SAW) ×2 IMPLANT
SCREW FEMALE HEX FIX 25X2.5 (ORTHOPEDIC DISPOSABLE SUPPLIES) ×2 IMPLANT
SET HNDPC FAN SPRY TIP SCT (DISPOSABLE) ×1 IMPLANT
STAPLER VISISTAT 35W (STAPLE) IMPLANT
STEM POLY PAT PLY 32M KNEE (Knees) ×2 IMPLANT
STEM TIBIA 5 DEG SZ E R KNEE (Knees) ×1 IMPLANT
SUCTION FRAZIER HANDLE 10FR (MISCELLANEOUS) ×1
SUCTION TUBE FRAZIER 10FR DISP (MISCELLANEOUS) ×1 IMPLANT
SUT ETHILON 2 0 FS 18 (SUTURE) IMPLANT
SUT MNCRL AB 4-0 PS2 18 (SUTURE) IMPLANT
SUT VIC AB 0 CT1 27 (SUTURE) ×2
SUT VIC AB 0 CT1 27XBRD ANBCTR (SUTURE) ×2 IMPLANT
SUT VIC AB 1 CTX 27 (SUTURE) ×6 IMPLANT
SUT VIC AB 2-0 CT1 27 (SUTURE) ×4
SUT VIC AB 2-0 CT1 TAPERPNT 27 (SUTURE) ×4 IMPLANT
SYR 50ML LL SCALE MARK (SYRINGE) ×4 IMPLANT
TIBIA STEM 5 DEG SZ E R KNEE (Knees) ×2 IMPLANT
TOWEL GREEN STERILE (TOWEL DISPOSABLE) ×2 IMPLANT
TOWEL GREEN STERILE FF (TOWEL DISPOSABLE) ×2 IMPLANT
TRAY CATH 16FR W/PLASTIC CATH (SET/KITS/TRAYS/PACK) IMPLANT
UNDERPAD 30X36 HEAVY ABSORB (UNDERPADS AND DIAPERS) ×2 IMPLANT
WRAP KNEE MAXI GEL POST OP (GAUZE/BANDAGES/DRESSINGS) ×2 IMPLANT

## 2021-03-08 NOTE — Op Note (Signed)
Total Knee Arthroplasty Procedure Note  Preoperative diagnosis: Right knee osteoarthritis  Postoperative diagnosis:same  Operative procedure: Right total knee arthroplasty. CPT [phone]  Surgeon: N. Eduard Roux, MD  Assist: Madalyn Rob, PA-C; necessary for the timely completion of procedure and due to complexity of procedure.  Anesthesia: Spinal, regional, local  Tourniquet time: see anesthesia record  Implants used: Zimmer persona Femur: CR 7 narrow Tibia: E Patella: 32 mm Polyethylene: 11 mm, MC  Indication: Tonya [name] is a 75 y.o. year old female with a history of knee pain. Having failed conservative management, the patient elected to proceed with a total knee arthroplasty.  We have reviewed the risk and benefits of the surgery and they elected to proceed after voicing understanding.  Procedure:  After informed consent was obtained and understanding of the risk were voiced including but not limited to bleeding, infection, damage to surrounding structures including nerves and vessels, blood clots, leg length inequality and the failure to achieve desired results, the operative extremity was marked with verbal confirmation of the patient in the holding area.   The patient was then brought to the operating room and transported to the operating room table in the supine position.  A tourniquet was applied to the operative extremity around the upper thigh. The operative limb was then prepped and draped in the usual sterile fashion and preoperative antibiotics were administered.  A time out was performed prior to the start of surgery confirming the correct extremity, preoperative antibiotic administration, as well as team members, implants and instruments available for the case. Correct surgical site was also confirmed with preoperative radiographs. The limb was then elevated for exsanguination and the tourniquet was inflated. A midline incision was made and a standard medial  parapatellar approach was performed.  The infrapatellar fat pad was removed.  Suprapatellar synovium was removed to reveal the anterior distal femoral cortex.  A medial peel was performed to release the capsule of the medial tibial plateau.  The patella was then everted and was prepared and sized to a 32 mm.  A cover was placed on the patella for protection from retractors.  The knee was then brought into full flexion and we then turned our attention to the femur.  The cruciates were sacrificed.  Start site was drilled in the femur and the intramedullary distal femoral cutting guide was placed, set at 5 degrees valgus, taking 10 mm of distal resection. The distal cut was made. Osteophytes were then removed.  Next, the proximal tibial cutting guide was placed with appropriate slope, varus/valgus alignment and depth of resection. The proximal tibial cut was made. Gap blocks were then used to assess the extension gap and alignment, and appropriate soft tissue releases were performed. Attention was turned back to the femur, which was sized using the sizing guide to a size 7. Appropriate rotation of the femoral component was determined using epicondylar axis, Whiteside's line, and assessing the flexion gap under ligament tension. The appropriate size 4-in-1 cutting block was placed and checked with an angel wing and cuts were made. Posterior femoral osteophytes and uncapped bone were then removed with the curved osteotome.  Trial components were placed, and stability was checked in full extension, mid-flexion, and deep flexion. Proper tibial rotation was determined and marked.  The patella tracked well without a lateral release.  The femoral lugs were then drilled. Trial components were then removed and tibial preparation performed.  The tibia was sized for a size E component.   The  bony surfaces were irrigated with a pulse lavage and then dried. Bone cement was vacuum mixed on the back table, and the final components  sized above were cemented into place.  Antibiotic irrigation was placed in the knee joint and soft tissues while the cement cured.  After cement had finished curing, excess cement was removed. The stability of the construct was re-evaluated throughout a range of motion and found to be acceptable. The trial liner was removed, the knee was copiously irrigated, and the knee was re-evaluated for any excess bone debris. The real polyethylene liner, 11 mm thick, was inserted and checked to ensure the locking mechanism had engaged appropriately. The tourniquet was deflated and hemostasis was achieved. The wound was irrigated with normal saline.  One gram of vancomycin powder was placed in the surgical bed.  Capsular closure was performed with a #1 vicryl, subcutaneous fat closed with a 0 vicryl suture, then subcutaneous tissue closed with interrupted 2.0 vicryl suture. The skin was then closed with a 2.0 nylon and dermabond. A sterile dressing was applied.  The patient was awakened in the operating room and taken to recovery in stable condition. All sponge, needle, and instrument counts were correct at the end of the case.  Tawanna Cooler was necessary for opening, closing, retracting, limb positioning and overall facilitation and completion of the surgery.  Position: supine  Complications: none.  Time Out: performed   Drains/Packing: none  Estimated blood loss: minimal  Returned to Recovery Room: in good condition.   Antibiotics: yes   Mechanical VTE (DVT) Prophylaxis: sequential compression devices, TED thigh-high  Chemical VTE (DVT) Prophylaxis: resume eliquis POD 1  Fluid Replacement  Crystalloid: see anesthesia record Blood: none  FFP: none   Specimens Removed: 1 to pathology   Sponge and Instrument Count Correct? yes   PACU: portable radiograph - knee AP and Lateral   Plan/RTC: Return in 2 weeks for wound check.   Weight Bearing/Load Lower Extremity: full   Implant Name Type Inv.  Item Serial No. Manufacturer Lot No. LRB No. Used Action  CEMENT BONE REFOBACIN [id number] [place] - [id number] Cement CEMENT BONE REFOBACIN [id number] [place]  ZIMMER RECON(ORTH,TRAU,BIO,SG)  Right 2 Implanted  TIBIA STEM 5 DEG SZ E R KNEE - [id number] Knees TIBIA STEM 5 DEG SZ E R KNEE  ZIMMER RECON(ORTH,TRAU,BIO,SG) [id number] Right 1 Implanted  INSERT TIB ARTISURF SZ6-7 R 11 - [id number] Joint INSERT TIB ARTISURF SZ6-7 R 11  ZIMMER RECON(ORTH,TRAU,BIO,SG) [id number] Right 1 Implanted  COMP FEM CEMT PERS SZ7 RT - [id number] Joint COMP FEM CEMT PERS SZ7 RT  ZIMMER RECON(ORTH,TRAU,BIO,SG) [id number] Right 1 Implanted  STEM POLY PAT PLY 76M KNEE - [id number] Knees STEM POLY PAT PLY 76M KNEE  ZIMMER RECON(ORTH,TRAU,BIO,SG) [id number] Right 1 Implanted    N. Eduard Roux, MD [facility] 9:17 AM

## 2021-03-08 NOTE — H&P (Signed)
PREOPERATIVE H&P  Chief Complaint: right knee degenerative joint disease  HPI: Tonya [name] is a 75 y.o. female who presents for surgical treatment of right knee degenerative joint disease.  She denies any changes in medical history.  Past Medical History:  Diagnosis Date  . Allergy   . Dysrhythmia   . Hyperlipidemia   . Hypertension   . [name] disease    per her report from skin biopsy prior  . Osteoarthritis    knee, hips  . Plantar fasciitis    right foot  . Tobacco use disorder    Past Surgical History:  Procedure Laterality Date  . ABDOMINAL HYSTERECTOMY     partial  . CHOLECYSTECTOMY  2003  . COLONOSCOPY  2010  . WISDOM TOOTH EXTRACTION     Social History   Socioeconomic History  . Marital status: Single    Spouse name: Not on file  . Number of children: Not on file  . Years of education: Not on file  . Highest education level: Not on file  Occupational History  . Not on file  Tobacco Use  . Smoking status: Current Every Day Smoker    Packs/day: 1.00    Years: 50.00    Pack years: 50.00    Types: Cigarettes  . Smokeless tobacco: Never Used  Vaping Use  . Vaping Use: Never used  Substance and Sexual Activity  . Alcohol use: Not Currently    Alcohol/week: 0.0 standard drinks    Comment: occ  . Drug use: No  . Sexual activity: Never  Other Topics Concern  . Not on file  Social History Narrative  . Not on file   Social Determinants of Health   Financial Resource Strain: Not on file  Food Insecurity: Not on file  Transportation Needs: Not on file  Physical Activity: Not on file  Stress: Not on file  Social Connections: Not on file   Family History  Problem Relation Age of Onset  . Hypertension Mother   . Stroke Mother        multiple  . Heart disease Father        reportedly died of MI  . Pneumonia Brother        died of pneumonia  . Other Brother        died in surgery, reportedly due to lung disease  . Liver disease  Brother        died of NASH   Allergies  Allergen Reactions  . Penicillins Rash    Reaction : unknown  . Meloxicam     Other reaction(s): stomach upset   Prior to Admission medications   Medication Sig Start Date End Date Taking? Authorizing Provider  apixaban (ELIQUIS) 5 MG TABS tablet Take 1 tablet (5 mg total) by mouth 2 (two) times daily. 08/04/20  Yes O'Neal, Cassie Freer, MD  Apoaequorin (PREVAGEN) 10 MG CAPS Take 10 mg by mouth daily.   Yes [provider]  aspirin 81 MG chewable tablet Chew 1 tablet (81 mg total) by mouth daily. 01/14/20  Yes Furth, Cadence H, PA-C  azelastine (ASTELIN) 0.1 % nasal spray Place 1 spray into both nostrils 2 (two) times daily as needed. 01/28/21  Yes [provider]  cetirizine (ZYRTEC) 10 MG tablet Take 1 tablet (10 mg total) by mouth daily. Patient taking differently: Take 10 mg by mouth daily as needed for allergies. 05/29/15  Yes Midge Minium, MD  cholecalciferol (VITAMIN D) 25 MCG (1000 UNIT)  tablet Take 1,000 Units by mouth daily.   Yes [provider]  COLLAGEN PO Take 1,000 mg by mouth daily.   Yes [provider]  cycloSPORINE (RESTASIS) 0.05 % ophthalmic emulsion Place 2 drops into both eyes 2 (two) times daily.   Yes [provider]  docusate sodium (COLACE) 100 MG capsule Take 1 capsule (100 mg total) by mouth daily as needed. 03/03/21 03/03/22  Aundra Dubin, PA-C  fluticasone (FLONASE) 50 MCG/ACT nasal spray Place 2 sprays into both nostrils daily. Patient taking differently: Place 2 sprays into both nostrils daily as needed for allergies or rhinitis. 05/29/15  Yes Midge Minium, MD  latanoprost (XALATAN) 0.005 % ophthalmic solution Place 1 drop into both eyes at bedtime.   Yes [provider]  methocarbamol (ROBAXIN) 500 MG tablet Take 1 tablet (500 mg total) by mouth 2 (two) times daily as needed. 03/03/21   Aundra Dubin, PA-C  metoprolol succinate (TOPROL XL) 25 MG 24 hr  tablet Take 1 tablet (25 mg total) by mouth daily. 08/04/20 08/04/21 Yes O'Neal, Cassie Freer, MD  ondansetron (ZOFRAN) 4 MG tablet Take 1 tablet (4 mg total) by mouth every 8 (eight) hours as needed for nausea or vomiting. 03/03/21   Aundra Dubin, PA-C  oxyCODONE-acetaminophen (PERCOCET) 5-325 MG tablet Take 1-2 tablets by mouth every 6 (six) hours as needed. 03/03/21   Aundra Dubin, PA-C  simvastatin (ZOCOR) 40 MG tablet Take 1 tablet (40 mg total) by mouth daily. 10/30/20  Yes O'Neal, Cassie Freer, MD  valsartan-hydrochlorothiazide (DIOVAN-HCT) 320-12.5 MG tablet Take 1 tablet by mouth every morning. 06/23/20  Yes [provider]  HYDROcodone-acetaminophen (NORCO) 5-325 MG tablet Take 1-2 tablets by mouth every 6 (six) hours as needed for moderate pain. 03/04/21   Aundra Dubin, PA-C     Positive ROS: All other systems have been reviewed and were otherwise negative with the exception of those mentioned in the HPI and as above.  Physical Exam: General: Alert, no acute distress Cardiovascular: No pedal edema Respiratory: No cyanosis, no use of accessory musculature GI: abdomen soft Skin: No lesions in the area of chief complaint Neurologic: Sensation intact distally Psychiatric: Patient is competent for consent with normal mood and affect Lymphatic: no lymphedema  MUSCULOSKELETAL: exam stable  Assessment: right knee degenerative joint disease  Plan: Plan for Procedure(s): RIGHT TOTAL KNEE ARTHROPLASTY  The risks benefits and alternatives were discussed with the patient including but not limited to the risks of nonoperative treatment, versus surgical intervention including infection, bleeding, nerve injury,  blood clots, cardiopulmonary complications, morbidity, mortality, among others, and they were willing to proceed.   Preoperative templating of the joint replacement has been completed, documented, and submitted to the Operating Room personnel in order to optimize  intra-operative equipment management.   Eduard Roux, MD 03/08/2021 6:01 AM

## 2021-03-08 NOTE — Anesthesia Postprocedure Evaluation (Signed)
Anesthesia Post Note  Patient: BRIAH NARY  Procedure(s) Performed: RIGHT TOTAL KNEE ARTHROPLASTY (Right Knee)     Patient location during evaluation: PACU Anesthesia Type: Spinal Level of consciousness: oriented and awake and alert Pain management: pain level controlled Vital Signs Assessment: post-procedure vital signs reviewed and stable Respiratory status: spontaneous breathing, respiratory function stable and nonlabored ventilation Cardiovascular status: blood pressure returned to baseline and stable Postop Assessment: no headache, no backache, no apparent nausea or vomiting and spinal receding Anesthetic complications: no   No complications documented.  Last Vitals:  Vitals:   03/08/21 1155 03/08/21 1215  BP: 109/67 128/70  Pulse: (!) 46 (!) 51  Resp: 15 16  Temp: 36.7 C (!) 36.4 C  SpO2: 98% 97%    Last Pain:  Vitals:   03/08/21 1215  TempSrc: Oral  PainSc: 0-No pain                 Lidia Collum

## 2021-03-08 NOTE — Progress Notes (Signed)
Bedside shift report complete. Received patient awake,alert/orientedx4 and able to verbalize needs. NAD noted; respirations easy/even on room air. Continuous pulse ox in place. Dressing to RLE c/d/i; ice man and bone foam present. Movement/sensation to all extremities noted. Ted hose to bilateral lower extremities. Foley in place c/d/i. Whiteboard updated. All safety measures in place and personal belongings within reach.

## 2021-03-08 NOTE — Plan of Care (Signed)

## 2021-03-08 NOTE — Discharge Instructions (Signed)

## 2021-03-08 NOTE — Progress Notes (Signed)
Orthopedic Tech Progress Note Patient Details:  Tonya [name] 05/10/1946 [id number]  Ortho Devices Type of Ortho Device: Bone foam zero knee Ortho Device/Splint Location: Right Lower Extremity Ortho Device/Splint Interventions: Ordered,Application   Post Interventions Patient Tolerated: Well Instructions Provided: Adjustment of device,Care of device,Poper ambulation with device   Tammy Sours 03/08/2021, 6:24 PM

## 2021-03-08 NOTE — Anesthesia Procedure Notes (Signed)
Anesthesia Regional Block: Adductor canal block   Pre-Anesthetic Checklist: ,, timeout performed, Correct Patient, Correct Site, Correct Laterality, Correct Procedure, Correct Position, site marked, Risks and benefits discussed,  Surgical consent,  Pre-op evaluation,  At surgeon's request and post-op pain management  Laterality: Right  Prep: chloraprep       Needles:  Injection technique: Single-shot  Needle Type: Echogenic Stimulator Needle     Needle Length: 10cm  Needle Gauge: 20     Additional Needles:   Procedures:,,,, ultrasound used (permanent image in chart),,,,  Narrative:  Start time: 03/08/2021 6:58 AM End time: 03/08/2021 7:03 AM Injection made incrementally with aspirations every 5 mL.  Performed by: Personally  Anesthesiologist: Lidia Collum, MD  Additional Notes: Standard monitors applied. Skin prepped. Good needle visualization with ultrasound. Injection made in 5cc increments with no resistance to injection. Patient tolerated the procedure well.

## 2021-03-08 NOTE — Anesthesia Procedure Notes (Signed)
Spinal  Patient location during procedure: OR Reason for block: surgical anesthesia Staffing Performed: anesthesiologist  Anesthesiologist: Ikaika Showers E, MD Preanesthetic Checklist Completed: patient identified, IV checked, risks and benefits discussed, surgical consent, monitors and equipment checked, pre-op evaluation and timeout performed Spinal Block Patient position: sitting Prep: DuraPrep and site prepped and draped Patient monitoring: continuous pulse ox, blood pressure and heart rate Approach: midline Location: L3-4 Injection technique: single-shot Needle Needle type: Pencan  Needle gauge: 24 G Needle length: 9 cm Assessment Events: CSF return Additional Notes Functioning IV was confirmed and monitors were applied. Sterile prep and drape, including hand hygiene and sterile gloves were used. The patient was positioned and the spine was prepped. The skin was anesthetized with lidocaine.  Free flow of clear CSF was obtained prior to injecting local anesthetic into the CSF. The needle was carefully withdrawn. The patient tolerated the procedure well.     

## 2021-03-08 NOTE — Transfer of Care (Signed)
Immediate Anesthesia Transfer of Care Note  Patient: Tonya [name]  Procedure(s) Performed: RIGHT TOTAL KNEE ARTHROPLASTY (Right Knee)  Patient Location: PACU  Anesthesia Type:MAC and Spinal  Level of Consciousness: oriented, drowsy and patient cooperative  Airway & Oxygen Therapy: Patient Spontanous Breathing and Patient connected to face mask oxygen  Post-op Assessment: Report given to RN and Post -op Vital signs reviewed and stable  Post vital signs: Reviewed  Last Vitals:  Vitals Value Taken Time  BP    Temp    Pulse 62 03/08/21 0956  Resp 16 03/08/21 0956  SpO2 98 % 03/08/21 0956  Vitals shown include unvalidated device data.  Last Pain:  Vitals:   03/08/21 0618  TempSrc: Oral  PainSc:          Complications: No complications documented.

## 2021-03-08 NOTE — Anesthesia Procedure Notes (Signed)
Procedure Name: MAC Date/Time: 03/08/2021 7:40 AM Performed by: Jenne Campus, CRNA Pre-anesthesia Checklist: Patient identified, Emergency Drugs available, Suction available, Patient being monitored and Timeout performed Oxygen Delivery Method: Simple face mask

## 2021-03-08 NOTE — Plan of Care (Signed)

## 2021-03-08 NOTE — Plan of Care (Signed)
  Problem: Education: Goal: Knowledge of General Education information will improve Description: Including pain rating scale, medication(s)/side effects and non-pharmacologic comfort measures 03/08/2021 1726 by Phebe Colla, RN Outcome: Progressing 03/08/2021 1303 by Phebe Colla, RN Outcome: Progressing   Problem: Health Behavior/Discharge Planning: Goal: Ability to manage health-related needs will improve 03/08/2021 1726 by Phebe Colla, RN Outcome: Progressing 03/08/2021 1303 by Phebe Colla, RN Outcome: Progressing   Problem: Clinical Measurements: Goal: Ability to maintain clinical measurements within normal limits will improve 03/08/2021 1726 by Phebe Colla, RN Outcome: Progressing 03/08/2021 1303 by Phebe Colla, RN Outcome: Progressing   Problem: Activity: Goal: Risk for activity intolerance will decrease 03/08/2021 1726 by Phebe Colla, RN Outcome: Progressing 03/08/2021 1303 by Phebe Colla, RN Outcome: Progressing   Problem: Nutrition: Goal: Adequate nutrition will be maintained 03/08/2021 1726 by Phebe Colla, RN Outcome: Progressing 03/08/2021 1303 by Phebe Colla, RN Outcome: Progressing   Problem: Pain Managment: Goal: General experience of comfort will improve 03/08/2021 1726 by Phebe Colla, RN Outcome: Progressing 03/08/2021 1303 by Phebe Colla, RN Outcome: Progressing

## 2021-03-08 NOTE — Evaluation (Signed)
Physical Therapy Evaluation Patient Details Name: Tonya [name] MRN: [id number] DOB: 1946/05/25 Today's Date: 03/08/2021   History of Present Illness  Pt is a 75 y/o female s/p R TKA. PMH includes HTN and tobacco use.  Clinical Impression  Pt is s/p surgery above with deficits below. Requiring min A for bed mobility and min guard A to stand. Pt unable to put weight on RLE secondary to pain. Pt with nausea/vomiting following stand, so further mobility deferred. Reviewed knee precautions with pt. Will continue to follow acutely.     Follow Up Recommendations Follow surgeon's recommendation for DC plan and follow-up therapies;Supervision for mobility/OOB    Equipment Recommendations  None recommended by PT    Recommendations for Other Services       Precautions / Restrictions Precautions Precautions: Knee Precaution Booklet Issued: No Precaution Comments: Verbally reviewed knee precautions. Restrictions Weight Bearing Restrictions: Yes RLE Weight Bearing: Weight bearing as tolerated      Mobility  Bed Mobility Overal bed mobility: Needs Assistance Bed Mobility: Supine to Sit;Sit to Supine     Supine to sit: Min assist Sit to supine: Min assist   General bed mobility comments: Min A for RLE assist. Increased time required secondary to pain.    Transfers Overall transfer level: Needs assistance Equipment used: Rolling walker (2 wheeled) Transfers: Sit to/from Stand Sit to Stand: Min guard         General transfer comment: Min guard for safety from elevated bed height. Pt standing on LLE and did not have much weight through RLE. Pt having onset of nausea/vomiting following standing, so further mobility deferred.  Ambulation/Gait                Stairs            Wheelchair Mobility    Modified Rankin (Stroke Patients Only)       Balance Overall balance assessment: Needs assistance Sitting-balance support: No upper extremity supported;Feet  supported Sitting balance-Leahy Scale: Good     Standing balance support: Bilateral upper extremity supported Standing balance-Leahy Scale: Poor Standing balance comment: Reliant on BUE support                             Pertinent Vitals/Pain Pain Assessment: Faces Faces Pain Scale: Hurts even more Pain Location: R knee Pain Descriptors / Indicators: Aching;Operative site guarding Pain Intervention(s): Limited activity within patient's tolerance;Monitored during session;Repositioned    Home Living Family/patient expects to be discharged to:: Private residence Living Arrangements: Other relatives (granddaughter) Available Help at Discharge: Family Type of Home: House Home Access: Stairs to enter Entrance Stairs-Rails: Right Entrance Stairs-Number of Steps: 4 Home Layout: Two level Home Equipment: Environmental consultant - 2 wheels;Bedside commode      Prior Function Level of Independence: Independent               Hand Dominance        Extremity/Trunk Assessment   Upper Extremity Assessment Upper Extremity Assessment: Overall WFL for tasks assessed    Lower Extremity Assessment Lower Extremity Assessment: RLE deficits/detail RLE Deficits / Details: Deficits consistent with post op pain and weakness. Knee extensor lag noted.    Cervical / Trunk Assessment Cervical / Trunk Assessment: Normal  Communication   Communication: No difficulties  Cognition Arousal/Alertness: Suspect due to medications Behavior During Therapy: WFL for tasks assessed/performed Overall Cognitive Status: Within Functional Limits for tasks assessed  General Comments: Pt falling asleep easily. Likely secondary to pain medications.      General Comments      Exercises     Assessment/Plan    PT Assessment Patient needs continued PT services  PT Problem List Decreased strength;Decreased range of motion;Decreased activity  tolerance;Decreased balance;Decreased mobility;Decreased knowledge of use of DME;Decreased knowledge of precautions;Pain       PT Treatment Interventions DME instruction;Stair training;Gait training;Functional mobility training;Therapeutic activities;Therapeutic exercise;Balance training;Patient/family education    PT Goals (Current goals can be found in the Care Plan section)  Acute Rehab PT Goals Patient Stated Goal: to go home PT Goal Formulation: With patient Time For Goal Achievement: 03/22/21 Potential to Achieve Goals: Good    Frequency 7X/week   Barriers to discharge        Co-evaluation               AM-PAC PT "6 Clicks" Mobility  Outcome Measure Help needed turning from your back to your side while in a flat bed without using bedrails?: A Little Help needed moving from lying on your back to sitting on the side of a flat bed without using bedrails?: A Little Help needed moving to and from a bed to a chair (including a wheelchair)?: A Little Help needed standing up from a chair using your arms (e.g., wheelchair or bedside chair)?: A Lot Help needed to walk in hospital room?: A Lot Help needed climbing 3-5 steps with a railing? : A Lot 6 Click Score: 15    End of Session Equipment Utilized During Treatment: Gait belt Activity Tolerance: Treatment limited secondary to medical complications (Comment);Patient limited by pain (nausea/vomiting) Patient left: in bed;with call bell/phone within reach;with bed alarm set Nurse Communication: Mobility status PT Visit Diagnosis: Other abnormalities of gait and mobility (R26.89);Difficulty in walking, not elsewhere classified (R26.2);Pain Pain - Right/Left: Right Pain - part of body: Knee    Time: 9242-6834 PT Time Calculation (min) (ACUTE ONLY): 21 min   Charges:   PT Evaluation $PT Eval Low Complexity: 1 Low          Lou Miner, DPT  Acute Rehabilitation Services  Pager: [phone] Office: [phone]   Rudean Hitt 03/08/2021, 4:11 PM

## 2021-03-09 ENCOUNTER — Encounter (HOSPITAL_COMMUNITY): Payer: Self-pay | Admitting: Orthopaedic Surgery

## 2021-03-09 DIAGNOSIS — M1711 Unilateral primary osteoarthritis, right knee: Secondary | ICD-10-CM | POA: Diagnosis not present

## 2021-03-09 LAB — CBC
HCT: 34.3 % — ABNORMAL LOW (ref 36.0–46.0)
Hemoglobin: 11.1 g/dL — ABNORMAL LOW (ref 12.0–15.0)
MCH: 31.5 pg (ref 26.0–34.0)
MCHC: 32.4 g/dL (ref 30.0–36.0)
MCV: 97.4 fL (ref 80.0–100.0)
Platelets: 230 10*3/uL (ref 150–400)
RBC: 3.52 MIL/uL — ABNORMAL LOW (ref 3.87–5.11)
RDW: 13.2 % (ref 11.5–15.5)
WBC: 12 10*3/uL — ABNORMAL HIGH (ref 4.0–10.5)
nRBC: 0.2 % (ref 0.0–0.2)

## 2021-03-09 LAB — BASIC METABOLIC PANEL
Anion gap: 4 — ABNORMAL LOW (ref 5–15)
BUN: 23 mg/dL (ref 8–23)
CO2: 29 mmol/L (ref 22–32)
Calcium: 9.2 mg/dL (ref 8.9–10.3)
Chloride: 108 mmol/L (ref 98–111)
Creatinine, Ser: 0.92 mg/dL (ref 0.44–1.00)
GFR, Estimated: >60 mL/min (ref >60)
Glucose, Bld: 126 mg/dL — ABNORMAL HIGH (ref 70–99)
Potassium: 4.2 mmol/L (ref 3.5–5.1)
Sodium: 141 mmol/L (ref 135–145)

## 2021-03-09 NOTE — Progress Notes (Signed)
Discharge summary packet provided by swot nurse.Pt seen in room and all questions and conceerns were fully answered . NO complaints. Pt d/c to home as ordered. Pt's dtr is resposible for her ride back home.

## 2021-03-09 NOTE — Progress Notes (Addendum)
Subjective: 1 Day Post-Op Procedure(s) (LRB): RIGHT TOTAL KNEE ARTHROPLASTY (Right) Patient reports pain as mild.  A little slow to communicate with PT this am  Objective: Vital signs in last 24 hours: Temp:  [97.5 F (36.4 C)-98.4 F (36.9 C)] 98.3 F (36.8 C) (06/07 0700) Pulse Rate:  [46-65] 54 (06/07 0700) Resp:  [10-22] 17 (06/07 0700) BP: (90-144)/(52-102) 118/81 (06/07 0700) SpO2:  [93 %-100 %] 96 % (06/07 0700)  Intake/Output from previous day: 06/06 0701 - 06/07 0700 In: 1908.6 [P.O.:360; I.V.:1248.6; IV Piggyback:300] Out: 400 [Urine:350; Blood:50] Intake/Output this shift: No intake/output data recorded.  Recent Labs    03/09/21 0326  HGB 11.1*   Recent Labs    03/09/21 0326  WBC 12.0*  RBC 3.52*  HCT 34.3*  PLT 230   Recent Labs    03/09/21 0326  NA 141  K 4.2  CL 108  CO2 29  BUN 23  CREATININE 0.92  GLUCOSE 126*  CALCIUM 9.2   No results for input(s): LABPT, INR in the last 72 hours.  Neurologically intact Neurovascular intact Sensation intact distally Intact pulses distally Dorsiflexion/Plantar flexion intact Incision: scant drainage No cellulitis present Compartment soft   Assessment/Plan: 1 Day Post-Op Procedure(s) (LRB): RIGHT TOTAL KNEE ARTHROPLASTY (Right) Advance diet Up with therapy D/C IV fluids Discharge home with home health after second PT session as long as she progresses with mobilization and does well with stair training. Also, we need to make sure mentation returns to baseline WBAT RLE ABLA- mild and stable    Anticipated LOS equal to or greater than 2 midnights due to - Age 74 and older with one or more of the following:  - Obesity  - Expected need for hospital services (PT, OT, Nursing) required for safe  discharge  - Anticipated need for postoperative skilled nursing care or inpatient rehab  - Active co-morbidities: a-fibb OR   - Unanticipated findings during/Post Surgery: None  - Patient is a high risk  of re-admission due to: None    Aundra Dubin 03/09/2021, 8:12 AM

## 2021-03-09 NOTE — TOC Transition Note (Signed)
Transition of Care [facility]) - CM/SW Discharge Note   Patient Details  Name: LOUIS GAW MRN: [id number] Date of Birth: 1946-01-15  [facility] CM/SW Contact:  Milinda Antis, [place] Phone Number: 03/09/2021, 10:13 AM   Clinical Narrative:    Patient will DC to: Home with Home health Anticipated DC date: 03/09/2021 Transport by: daughter   Per MD patient ready for DC home with home health.  CSW spoke with the patient who was alert and oriented during the encounter.  The patient informed CSW that she already has a home health agency that will be coming to provide HHPT.  She also has all needed DME.  CSW verified the patient's address, phone number, and PCP.  The patient informed CSW that her cell phone number is [phone].  The patient's daughter will be transporting the patient home.  CSW spoke with Stacie at [facility] and verified that the patient is active with them and has received all needed DME.  CSW informed Lanetta Inch that patient should be discharging today.  CSW will sign off for now as social work intervention is no longer needed. Please consult [place] again if new needs arise.       Barriers to Discharge: Barriers Resolved   Patient Goals and CMS Choice   CMS Medicare.gov Compare Post Acute Care list provided to:: Patient Choice offered to / list presented to : Patient  Discharge Placement                       Discharge Plan and Services                          HH Arranged: PT [facility] Agency: [place] Date [place]: 03/09/21 Time [place]: 1013 Representative spoke with at [place]: [place] ([place]) Interventions     Readmission Risk Interventions No flowsheet data found.

## 2021-03-09 NOTE — Discharge Summary (Signed)
Patient ID: Tonya [name] MRN: [id number] DOB/AGE: 1945/10/16 75 y.o.  Admit date: 03/08/2021 Discharge date: 03/09/2021  Admission Diagnoses:  Principal Problem:   Primary osteoarthritis of right knee Active Problems:   Status post total right knee replacement   Discharge Diagnoses:  Same  Past Medical History:  Diagnosis Date  . Allergy   . Dysrhythmia   . Hyperlipidemia   . Hypertension   . [name] disease    per her report from skin biopsy prior  . Osteoarthritis    knee, hips  . Plantar fasciitis    right foot  . Tobacco use disorder     Surgeries: Procedure(s): RIGHT TOTAL KNEE ARTHROPLASTY on 03/08/2021   Consultants:   Discharged Condition: Improved  Hospital Course: Tonya [name] is an 75 y.o. female who was admitted 03/08/2021 for operative treatment ofPrimary osteoarthritis of right knee. Patient has severe unremitting pain that affects sleep, daily activities, and work/hobbies. After pre-op clearance the patient was taken to the operating room on 03/08/2021 and underwent  Procedure(s): RIGHT TOTAL KNEE ARTHROPLASTY.    Patient was given perioperative antibiotics:  Anti-infectives (From admission, onward)   Start     Dose/Rate Route Frequency Ordered Stop   03/08/21 1400  ceFAZolin (ANCEF) IVPB 2g/100 mL premix        2 g 200 mL/hr over 30 Minutes Intravenous Every 6 hours 03/08/21 1215 03/08/21 2107   03/08/21 0803  vancomycin (VANCOCIN) powder  Status:  Discontinued          As needed 03/08/21 0804 03/08/21 0950   03/08/21 0600  ceFAZolin (ANCEF) IVPB 2g/100 mL premix        2 g 200 mL/hr over 30 Minutes Intravenous On call to O.R. 03/08/21 0542 03/08/21 0755       Patient was given sequential compression devices, early ambulation, and chemoprophylaxis to prevent DVT.  Patient benefited maximally from hospital stay and there were no complications.    Recent vital signs:  Patient Vitals for the past 24 hrs:  BP Temp Temp src Pulse Resp SpO2   03/09/21 0700 118/81 98.3 F (36.8 C) Oral (!) 54 17 96 %  03/09/21 0504 (!) 107/52 97.7 F (36.5 C) Oral (!) 50 16 94 %  03/08/21 2354 (!) 115/56 97.8 F (36.6 C) Oral (!) 58 16 98 %  03/08/21 1956 (!) 130/58 98.4 F (36.9 C) Oral (!) 46 18 96 %  03/08/21 1532 132/66 97.7 F (36.5 C) Oral (!) 46 17 96 %  03/08/21 1437 (!) 143/66 97.8 F (36.6 C) Oral (!) 48 16 97 %  03/08/21 1215 128/70 (!) 97.5 F (36.4 C) Oral (!) 51 16 97 %  03/08/21 1155 109/67 98 F (36.7 C) -- (!) 46 15 98 %  03/08/21 1126 128/63 -- -- (!) 49 10 93 %  03/08/21 1100 (!) 90/57 -- -- (!) 48 14 100 %  03/08/21 1040 120/71 -- -- (!) 47 13 98 %  03/08/21 1025 114/66 -- -- (!) 48 13 100 %  03/08/21 1010 (!) 107/55 -- -- (!) 55 (!) 22 100 %  03/08/21 1005 (!) 106/58 -- -- (!) 49 12 98 %  03/08/21 0955 (!) 144/102 98.1 F (36.7 C) -- 65 17 97 %     Recent laboratory studies:  Recent Labs    03/09/21 0326  WBC 12.0*  HGB 11.1*  HCT 34.3*  PLT 230  NA 141  K 4.2  CL 108  CO2 29  BUN 23  CREATININE  0.92  GLUCOSE 126*  CALCIUM 9.2     Discharge Medications:   Allergies as of 03/09/2021      Reactions   Penicillins Rash   Reaction : unknown   Meloxicam    Other reaction(s): stomach upset      Medication List    STOP taking these medications   aspirin 81 MG chewable tablet   COLLAGEN PO   oxyCODONE-acetaminophen 5-325 MG tablet Commonly known as: Percocet     TAKE these medications   apixaban 5 MG Tabs tablet Commonly known as: ELIQUIS Take 1 tablet (5 mg total) by mouth 2 (two) times daily.   azelastine 0.1 % nasal spray Commonly known as: ASTELIN Place 1 spray into both nostrils 2 (two) times daily as needed.   cetirizine 10 MG tablet Commonly known as: ZYRTEC Take 1 tablet (10 mg total) by mouth daily. What changed:   when to take this  reasons to take this   cholecalciferol 25 MCG (1000 UNIT) tablet Commonly known as: VITAMIN D Take 1,000 Units by mouth daily.    cycloSPORINE 0.05 % ophthalmic emulsion Commonly known as: RESTASIS Place 2 drops into both eyes 2 (two) times daily.   docusate sodium 100 MG capsule Commonly known as: Colace Take 1 capsule (100 mg total) by mouth daily as needed.   fluticasone 50 MCG/ACT nasal spray Commonly known as: FLONASE Place 2 sprays into both nostrils daily. What changed:   when to take this  reasons to take this   HYDROcodone-acetaminophen 5-325 MG tablet Commonly known as: Norco Take 1-2 tablets by mouth every 6 (six) hours as needed for moderate pain.   latanoprost 0.005 % ophthalmic solution Commonly known as: XALATAN Place 1 drop into both eyes at bedtime.   methocarbamol 500 MG tablet Commonly known as: Robaxin Take 1 tablet (500 mg total) by mouth 2 (two) times daily as needed.   metoprolol succinate 25 MG 24 hr tablet Commonly known as: Toprol XL Take 1 tablet (25 mg total) by mouth daily.   ondansetron 4 MG tablet Commonly known as: Zofran Take 1 tablet (4 mg total) by mouth every 8 (eight) hours as needed for nausea or vomiting.   Prevagen 10 MG Caps Generic drug: Apoaequorin Take 10 mg by mouth daily.   simvastatin 40 MG tablet Commonly known as: ZOCOR Take 1 tablet (40 mg total) by mouth daily.   valsartan-hydrochlorothiazide 320-12.5 MG tablet Commonly known as: DIOVAN-HCT Take 1 tablet by mouth every morning.            Durable Medical Equipment  (From admission, onward)         Start     Ordered   03/08/21 1216  DME Walker rolling  Once       Question:  Patient needs a walker to treat with the following condition  Answer:  Total knee replacement status   03/08/21 1215   03/08/21 1216  DME 3 n 1  Once        03/08/21 1215   03/08/21 1216  DME Bedside commode  Once       Question:  Patient needs a bedside commode to treat with the following condition  Answer:  Total knee replacement status   03/08/21 1215          Diagnostic Studies: DG Chest 2  View  Result Date: 02/25/2021 CLINICAL DATA:  Pre-admission for right total knee arthroplasty. EXAM: CHEST - 2 VIEW COMPARISON:  January 12, 2020. FINDINGS: The heart  size and mediastinal contours are within normal limits. Both lungs are clear. The visualized skeletal structures are unremarkable. IMPRESSION: No active cardiopulmonary disease. Electronically Signed   By: Marijo Conception M.D.   On: 02/25/2021 11:36   DG Knee Right Port  Result Date: 03/08/2021 CLINICAL DATA:  Postoperative pain. EXAM: PORTABLE RIGHT KNEE - 1-2 VIEW COMPARISON:  December 01, 2020. FINDINGS: Status post right total knee arthroplasty. The femoral tibial components appear to be well situated. Expected postoperative changes are noted in the soft tissues anteriorly. IMPRESSION: Status post right total knee arthroplasty. Electronically Signed   By: Marijo Conception M.D.   On: 03/08/2021 10:24    Disposition: Discharge disposition: 01-Home or Self Care          Follow-up Information    Leandrew Koyanagi, MD. Schedule an appointment as soon as possible for a visit in 2 weeks.   Specialty: Orthopedic Surgery Contact information: [address] [place] [address] [phone]                Signed: Aundra Dubin 03/09/2021, 8:16 AM

## 2021-03-09 NOTE — Progress Notes (Signed)
Physical Therapy Treatment Patient Details Name: Tonya [name] MRN: [id number] DOB: 16-Jan-1946 Today's Date: 03/09/2021    History of Present Illness Pt is a 75 y/o female s/p R TKA. PMH includes HTN and tobacco use.    PT Comments    Pt seen for second session for further practice on stair negotiation and walker management. Pt remains to have decreased insight to deficits and safety with poor memory. Per Mendel Ryder, [place] this is her baseline. Pt reports dtr is coming to pick her up and her son is coming in from [place] to help her but she doesn't know when. Pt will need 24/7 supervision for safe d/c home. Acute PT to cont to follow.    Follow Up Recommendations  Follow surgeon's recommendation for DC plan and follow-up therapies;Supervision for mobility/OOB     Equipment Recommendations  None recommended by PT (has DME already)    Recommendations for Other Services       Precautions / Restrictions Precautions Precautions: Knee Precaution Booklet Issued: Yes (comment) Precaution Comments: reviewed precautions several times, no carry over Restrictions Weight Bearing Restrictions: Yes RLE Weight Bearing: Weight bearing as tolerated    Mobility  Bed Mobility Overal bed mobility: Needs Assistance Bed Mobility: Supine to Sit     Supine to sit: Supervision     General bed mobility comments: pt found walking around in room    Transfers Overall transfer level: Needs assistance Equipment used: Rolling walker (2 wheeled) Transfers: Sit to/from Stand Sit to Stand: Min guard         General transfer comment: pt continues to pull up on walker despite  verbal cues and walker falling towards her  Ambulation/Gait Ambulation/Gait assistance: Min guard Gait Distance (Feet): 150 Feet Assistive device: Rolling walker (2 wheeled) Gait Pattern/deviations: Step-through pattern;Decreased stride length Gait velocity: wfl Gait velocity interpretation: <1.31 ft/sec, indicative of  household ambulator General Gait Details: pt with fluid gait pattern however with significant R knee flexion, attempted to emphasize pushing knee back however pt couldn't comprehend   Stairs Stairs: Yes Stairs assistance: Min guard Stair Management: Two rails;Step to pattern;Forwards Number of Stairs: 6 General stair comments: pt with poor carry over of sequencing of "up with the good down with the bad" repeatedly trying to do reciprocal pattern   Wheelchair Mobility    Modified Rankin (Stroke Patients Only)       Balance Overall balance assessment: Needs assistance Sitting-balance support: No upper extremity supported Sitting balance-Leahy Scale: Good     Standing balance support: Bilateral upper extremity supported Standing balance-Leahy Scale: Poor Standing balance comment: reliant on RW, R knee buckles when she lets go                            Cognition Arousal/Alertness: Awake/alert Behavior During Therapy: Flat affect Overall Cognitive Status: Impaired/Different from baseline Area of Impairment: Memory;Following commands;Problem solving;Safety/judgement                     Memory: Decreased short-term memory (pt doesn't recall getting up with therapy yesterday and vomitting) Following Commands: Follows one step commands with increased time Safety/Judgement: Decreased awareness of safety;Decreased awareness of deficits   Problem Solving: Slow processing;Difficulty sequencing;Requires verbal cues;Requires tactile cues General Comments: pt found walking around room without walker, flexed at hips furniture walking, pt instructed not to get up by herself and to always use the walker, pt with no carry over  Exercises Total Joint Exercises Ankle Circles/Pumps: AROM;Both;10 reps Quad Sets: AROM;Right;10 reps;Supine Long Arc Quad: AROM;Right;10 reps;Seated Goniometric ROM: -5-93 degrees in sitting    General Comments General comments (skin  integrity, edema, etc.): VSS      Pertinent Vitals/Pain Pain Assessment: 0-10 Pain Score: 8  Pain Location: R knee Pain Descriptors / Indicators:  (stings) Pain Intervention(s): Patient requesting pain meds-RN notified    Home Living                      Prior Function            PT Goals (current goals can now be found in the care plan section) Acute Rehab PT Goals Patient Stated Goal: to go home PT Goal Formulation: With patient Time For Goal Achievement: 03/22/21 Potential to Achieve Goals: Good Progress towards PT goals: Progressing toward goals    Frequency    7X/week      PT Plan Current plan remains appropriate    Co-evaluation              AM-PAC PT "6 Clicks" Mobility   Outcome Measure  Help needed turning from your back to your side while in a flat bed without using bedrails?: None Help needed moving from lying on your back to sitting on the side of a flat bed without using bedrails?: None Help needed moving to and from a bed to a chair (including a wheelchair)?: A Little Help needed standing up from a chair using your arms (e.g., wheelchair or bedside chair)?: A Little Help needed to walk in hospital room?: A Little Help needed climbing 3-5 steps with a railing? : A Little 6 Click Score: 20    End of Session Equipment Utilized During Treatment: Gait belt Activity Tolerance: Patient tolerated treatment well Patient left: with call bell/phone within reach (sitting EOB) Nurse Communication: Mobility status PT Visit Diagnosis: Other abnormalities of gait and mobility (R26.89);Difficulty in walking, not elsewhere classified (R26.2);Pain Pain - Right/Left: Right Pain - part of body: Knee     Time: 1031-5945 PT Time Calculation (min) (ACUTE ONLY): 15 min  Charges:  $Gait Training: 8-22 mins $Therapeutic Exercise: 8-22 mins                     Kittie Plater, PT, DPT Acute Rehabilitation Services Pager #: [phone] Office #:  [phone]    Berline Lopes 03/09/2021, 2:11 PM

## 2021-03-09 NOTE — Progress Notes (Signed)
Physical Therapy Treatment Patient Details Name: Tonya [name] MRN: [id number] DOB: 10-30-1945 Today's Date: 03/09/2021    History of Present Illness Pt is a 75 y/o female s/p R TKA. PMH includes HTN and tobacco use.    PT Comments    Pt progressing towards goals and had no n/v this session, with minimal R knee pain. Pt tolerating both knee extension and flexion well, achieving 90 deg of AROM to R knee. Pt however demonstrating impaired cognition and decreased insight to safety. Pt doesn't recall working with PT yesterday and states she only has her 59yo granddaughter home with her, when asked if the granddaughter was staying home alone she stated "yeah I guess". Pt unable to comprehend/recall stair sequencing and repeatedly kept taking a hand off the walker and R knee would buckle. Pt unsafe to be home alone from cognitive stand point. Acute PT to return later today for second session.   Follow Up Recommendations  Follow surgeon's recommendation for DC plan and follow-up therapies;Supervision for mobility/OOB     Equipment Recommendations  None recommended by PT (has DME already)    Recommendations for Other Services       Precautions / Restrictions Precautions Precautions: Knee Precaution Booklet Issued: Yes (comment) Precaution Comments: reviewed precautions several times, no carry over Restrictions Weight Bearing Restrictions: Yes RLE Weight Bearing: Weight bearing as tolerated    Mobility  Bed Mobility Overal bed mobility: Needs Assistance Bed Mobility: Supine to Sit     Supine to sit: Supervision     General bed mobility comments: pt brought self up to long sit and was able to bring bilat LEs off EOB without assist    Transfers Overall transfer level: Needs assistance Equipment used: Rolling walker (2 wheeled) Transfers: Sit to/from Stand Sit to Stand: Min guard         General transfer comment: verbal cues for safe hand placement, no carry over despite  freq cues  Ambulation/Gait Ambulation/Gait assistance: Min assist Gait Distance (Feet): 150 Feet Assistive device: Rolling walker (2 wheeled) Gait Pattern/deviations: Step-through pattern;Decreased stride length Gait velocity: wfl for surgical candidate Gait velocity interpretation: <1.31 ft/sec, indicative of household ambulator General Gait Details: initially pt was step gait pattern requiring max directional verbal cues and assist for walker management, pt repeated kept stepping out past front of walker despite max verbal cues. Pt then progressed to fluid step through gait pattern, pt repeatedly kept taking hands of RW and R knee would buckle requiring minA to prevent fall.   Stairs Stairs: Yes Stairs assistance: Min assist Stair Management: One rail Right;Sideways (also forward with bilat HR) Number of Stairs: 6 General stair comments: completed stair negotiation with R hand rail sideway to mimic home entrance and then bilat hand rail to mimic flight upstairs, pt unable to comprehend or recall sequencing "up with the good, down with the bad" will trial again next session   Wheelchair Mobility    Modified Rankin (Stroke Patients Only)       Balance Overall balance assessment: Needs assistance Sitting-balance support: No upper extremity supported Sitting balance-Leahy Scale: Good     Standing balance support: Bilateral upper extremity supported Standing balance-Leahy Scale: Poor Standing balance comment: reliant on RW, R knee buckles when she lets go                            Cognition Arousal/Alertness: Awake/alert Behavior During Therapy: Flat affect Overall Cognitive Status: Impaired/Different from baseline Area  of Impairment: Memory;Following commands;Problem solving                     Memory: Decreased short-term memory (pt doesn't recall getting up with therapy yesterday and vomitting) Following Commands: Follows one step commands with  increased time     Problem Solving: Slow processing;Difficulty sequencing;Requires verbal cues;Requires tactile cues General Comments: pt unable to grasp "up with the good, down with the bad" sequencing for stair negotiation requiring max, freq verbal cues and tactile correction, pt reports her 55yo granddaughter is who stays with her but doesn't know who is watching over her or seems to be bothered by it      Exercises Total Joint Exercises Ankle Circles/Pumps: AROM;Both;10 reps Quad Sets: AROM;Right;10 reps;Supine Long Arc Quad: AROM;Right;10 reps;Seated Goniometric ROM: -5-93 degrees in sitting    General Comments General comments (skin integrity, edema, etc.): incision with noted blood on dressing, circled by RN, mild edema in R LE      Pertinent Vitals/Pain Pain Assessment: 0-10 Pain Score: 3  Pain Location: R knee Pain Descriptors / Indicators: Operative site guarding Pain Intervention(s): Monitored during session    Home Living                      Prior Function            PT Goals (current goals can now be found in the care plan section) Progress towards PT goals: Progressing toward goals    Frequency    7X/week      PT Plan Current plan remains appropriate    Co-evaluation              AM-PAC PT "6 Clicks" Mobility   Outcome Measure  Help needed turning from your back to your side while in a flat bed without using bedrails?: None Help needed moving from lying on your back to sitting on the side of a flat bed without using bedrails?: None Help needed moving to and from a bed to a chair (including a wheelchair)?: A Little Help needed standing up from a chair using your arms (e.g., wheelchair or bedside chair)?: A Little Help needed to walk in hospital room?: A Little Help needed climbing 3-5 steps with a railing? : A Little 6 Click Score: 20    End of Session Equipment Utilized During Treatment: Gait belt Activity Tolerance: Patient  tolerated treatment well Patient left: in chair;with call bell/phone within reach;with chair alarm set Nurse Communication: Mobility status PT Visit Diagnosis: Other abnormalities of gait and mobility (R26.89);Difficulty in walking, not elsewhere classified (R26.2);Pain Pain - Right/Left: Right Pain - part of body: Knee     Time: 6761-9509 PT Time Calculation (min) (ACUTE ONLY): 35 min  Charges:  $Gait Training: 8-22 mins $Therapeutic Exercise: 8-22 mins                     Kittie Plater, PT, DPT Acute Rehabilitation Services Pager #: [phone] Office #: [phone]    Berline Lopes 03/09/2021, 12:00 PM

## 2021-03-10 ENCOUNTER — Telehealth: Payer: Self-pay | Admitting: *Deleted

## 2021-03-10 NOTE — Telephone Encounter (Signed)
Tonya [name] with [facility] called. Patient still refusing to let therapy come out this week. She has declined for every day this week. He's following up from a call from yesterday. Thanks.

## 2021-03-10 NOTE — Telephone Encounter (Signed)
ok 

## 2021-03-12 ENCOUNTER — Telehealth: Payer: Self-pay

## 2021-03-12 NOTE — Telephone Encounter (Signed)
Per Mendel Ryder, you do not need any antibiotics

## 2021-03-12 NOTE — Telephone Encounter (Signed)
Does not need any

## 2021-03-12 NOTE — Telephone Encounter (Signed)
Pt called asking if she needed antibiotics

## 2021-03-23 ENCOUNTER — Ambulatory Visit (INDEPENDENT_AMBULATORY_CARE_PROVIDER_SITE_OTHER): Payer: Medicare HMO | Admitting: Physician Assistant

## 2021-03-23 ENCOUNTER — Other Ambulatory Visit: Payer: Self-pay

## 2021-03-23 ENCOUNTER — Encounter: Payer: Self-pay | Admitting: Orthopaedic Surgery

## 2021-03-23 VITALS — Ht 65.5 in | Wt 163.0 lb

## 2021-03-23 DIAGNOSIS — Z96651 Presence of right artificial knee joint: Secondary | ICD-10-CM

## 2021-03-23 MED ORDER — OXYCODONE-ACETAMINOPHEN 5-325 MG PO TABS: 1.0000 | ORAL_TABLET | Freq: Three times a day (TID) | ORAL | 0 refills | Status: DC | PRN

## 2021-03-23 NOTE — Progress Notes (Signed)
Post-Op Visit Note   Patient: Tonya [name]           Date of Birth: October 09, 1945           MRN: [id number] Visit Date: 03/23/2021 PCP: Alroy Dust, L.Marlou Sa, MD   Assessment & Plan:  Chief Complaint:  Chief Complaint  Patient presents with   Right Knee - Follow-up    Right TKA 03/08/2021   Visit Diagnoses:  1. S/P total knee replacement, right     Plan: Patient is a pleasant 75 year old female who comes in today 2 weeks out right total knee replacement, 03/08/2021.  She has been doing fairly well with home health physical therapy but notes a fair amount of pain which is not relieved with Norco.  She is ambulating with a walker at times but primarily unassisted at home.  Of note, she is only taking 1 Eliquis per day.  She has not been taking the additional baby aspirin once daily.  Examination of her right knee reveals a fully healed surgical incision with nylon sutures in place.  No evidence of infection or cellulitis.  Calf is soft nontender.  She is neurovascular intact distally.  Today, sutures were removed and Steri-Strips applied.  We have discussed the need for her to start taking a baby aspirin once daily for another 4 weeks.  I have agreed to call in Percocet as I think she would benefit from a stronger medication at this point.  We will also try and extend her home health physical therapy for another 2 weeks as she does not have a mode of transportation to get to her therapy visits.  I will also go ahead and put in an internal referral for outpatient physical therapy once home health is finished.  Dental prophylaxis reinforced.  Follow-up with [place] in 4 weeks time for repeat evaluation and 2 view x-rays of the right knee.  Call with concerns or questions in the meantime.  Follow-Up Instructions: Return in about 4 weeks (around 04/20/2021).   Orders:  No orders of the defined types were placed in this encounter.  No orders of the defined types were placed in this  encounter.   Imaging: No new imaging  PMFS History: Patient Active Problem List   Diagnosis Date Noted   Primary osteoarthritis of right knee 03/08/2021   Status post total right knee replacement 03/08/2021   Lactic acidosis 01/13/2020   Elevated troponin 01/13/2020   Demand ischemia ([place]) 01/13/2020   AKI (acute kidney injury) ([place]) 01/13/2020   Atrial fibrillation ([place]) 01/12/2020   Physical exam 07/02/2015   Eustachian tube dysfunction 05/29/2015   Hyperlipidemia 06/04/2013   PVCs (premature ventricular contractions) 06/04/2013   Snoring 11/08/2011   Sleep disturbance 11/08/2011   Need for prophylactic vaccination and inoculation against influenza 11/08/2011   Essential hypertension, benign 07/13/2011   Tobacco use disorder 07/13/2011   Past Medical History:  Diagnosis Date   Allergy    Dysrhythmia    Hyperlipidemia    Hypertension    [name] disease    per her report from skin biopsy prior   Osteoarthritis    knee, hips   Plantar fasciitis    right foot   Tobacco use disorder     Family History  Problem Relation Age of Onset   Hypertension Mother    Stroke Mother        multiple   Heart disease Father        reportedly died of MI   Pneumonia Brother  died of pneumonia   Other Brother        died in surgery, reportedly due to lung disease   Liver disease Brother        died of NASH    Past Surgical History:  Procedure Laterality Date   ABDOMINAL HYSTERECTOMY     partial   CHOLECYSTECTOMY  2003   COLONOSCOPY  2010   TOTAL KNEE ARTHROPLASTY Right 03/08/2021   Procedure: RIGHT TOTAL KNEE ARTHROPLASTY;  Surgeon: Leandrew Koyanagi, MD;  Location: [facility];  Service: Orthopedics;  Laterality: Right;   WISDOM TOOTH EXTRACTION     Social History   Occupational History   Not on file  Tobacco Use   Smoking status: Every Day    Packs/day: 1.00    Years: 50.00    Pack years: 50.00    Types: Cigarettes   Smokeless tobacco: Never  Vaping Use    Vaping Use: Never used  Substance and Sexual Activity   Alcohol use: Not Currently    Alcohol/week: 0.0 standard drinks    Comment: occ   Drug use: No   Sexual activity: Never

## 2021-03-26 ENCOUNTER — Telehealth: Payer: Self-pay | Admitting: Orthopaedic Surgery

## 2021-03-26 NOTE — Telephone Encounter (Signed)
Pt was called and advised and stated understanding 

## 2021-03-26 NOTE — Telephone Encounter (Signed)
After she completes home health PT we will have her do outpatient PT at our office.

## 2021-03-26 NOTE — Telephone Encounter (Signed)
Please advise 

## 2021-03-26 NOTE — Telephone Encounter (Signed)
Patient calling to confirm if she needs to continue in home P.T. or come to her P.T. at [facility] as since she has an upcoming appt for here next week. Home Health P.T. told patient that Dr. Erlinda Hong wants patient to continue working with them so she did not know what the doctor wants her to do. The best call back number is [phone].

## 2021-03-31 ENCOUNTER — Ambulatory Visit: Payer: Medicare HMO | Admitting: Physical Therapy

## 2021-04-12 ENCOUNTER — Encounter: Payer: Self-pay | Admitting: Rehabilitative and Restorative Service Providers"

## 2021-04-12 ENCOUNTER — Other Ambulatory Visit: Payer: Self-pay

## 2021-04-12 ENCOUNTER — Ambulatory Visit: Payer: Medicare HMO | Admitting: Rehabilitative and Restorative Service Providers"

## 2021-04-12 DIAGNOSIS — M6281 Muscle weakness (generalized): Secondary | ICD-10-CM

## 2021-04-12 DIAGNOSIS — R2689 Other abnormalities of gait and mobility: Secondary | ICD-10-CM

## 2021-04-12 DIAGNOSIS — M25561 Pain in right knee: Secondary | ICD-10-CM | POA: Diagnosis not present

## 2021-04-12 DIAGNOSIS — R2681 Unsteadiness on feet: Secondary | ICD-10-CM

## 2021-04-12 NOTE — Therapy (Signed)
[facility] [address] [place], [place], [address] Phone: [phone]   Fax:  [phone]  Physical Therapy Evaluation  Patient Details  Name: Tonya [name] MRN: [id number] Date of Birth: 08/18/1946 Referring Provider (PT): [place], [place]   Encounter Date: 04/12/2021   PT End of Session - 04/12/21 1100     Visit Number 1    Number of Visits 16    Date for PT Re-Evaluation 06/07/21    Authorization Type [employer] MCR    Progress Note Due on Visit 10    PT Start Time 1014    PT Stop Time 1059    PT Time Calculation (min) 45 min    Activity Tolerance Patient tolerated treatment well    Behavior During Therapy [facility] for tasks assessed/performed             Past Medical History:  Diagnosis Date   Allergy    Dysrhythmia    Hyperlipidemia    Hypertension    [name] disease    per her report from skin biopsy prior   Osteoarthritis    knee, hips   Plantar fasciitis    right foot   Tobacco use disorder     Past Surgical History:  Procedure Laterality Date   ABDOMINAL HYSTERECTOMY     partial   CHOLECYSTECTOMY  2003   COLONOSCOPY  2010   TOTAL KNEE ARTHROPLASTY Right 03/08/2021   Procedure: RIGHT TOTAL KNEE ARTHROPLASTY;  Surgeon: Leandrew Koyanagi, MD;  Location: [facility];  Service: Orthopedics;  Laterality: Right;   WISDOM TOOTH EXTRACTION      There were no vitals filed for this visit.    Subjective Assessment - 04/12/21 1022     Subjective HHPT finished 7/822. I have been hurting really bad this weekend.    Patient is accompained by: Family member   daughter   Pertinent History R TKR 03/08/21, R plantar fasciitis    Limitations Sitting;House hold activities;Lifting;Standing;Walking    How long can you sit comfortably? indeterminate amount of time    How long can you stand comfortably? 10 min    How long can you walk comfortably? 15 min    Patient Stated Goals to be able to walk without pain    Currently in Pain? Yes     Pain Score 8     Pain Location Knee    Pain Orientation Right;Anterior    Pain Descriptors / Indicators Sharp;Throbbing    Pain Type Acute pain    Pain Radiating Towards n/a    Pain Onset 1 to 4 weeks ago    Pain Frequency Constant    Aggravating Factors  standing    Pain Relieving Factors moving    Effect of Pain on Daily Activities pt deals with pain with activities    Multiple Pain Sites No                [facility] PT Assessment - 04/12/21 0001       Assessment   Medical Diagnosis R TKR    Referring Provider (PT) Venida Jarvis, PA-C    Onset Date/Surgical Date 03/08/21    Hand Dominance Right    Next MD Visit TBD    Prior Therapy HHPT      Precautions   Precautions None      Restrictions   Weight Bearing Restrictions No      Balance Screen   Has the patient fallen in the past 6 months No    Has the patient  had a decrease in activity level because of a fear of falling?  No    Is the patient reluctant to leave their home because of a fear of falling?  No      Home Ecologist residence      Prior Function   Level of Independence Independent with basic ADLs      Cognition   Overall Cognitive Status Within Functional Limits for tasks assessed      Sensation   Light Touch Appears Intact      Coordination   Gross Motor Movements are Fluid and Coordinated Yes      Posture/Postural Control   Posture Comments no significant concerns, rounded shoulders      ROM / Strength   AROM / PROM / Strength AROM;Strength      AROM   Overall AROM Comments at rest, R knee -26 degrees ext; -17 degrees with quad set; R knee flex 107, no extension lag with SLR      Strength   Overall Strength Comments L hip flex 3/5, all others 4+/5. R hip flex 2+/5, all others 4-/5.      Palpation   Palpation comment scar mobility decreased peripatella and distally cross frictionally and up/down      Ambulation/Gait   Gait Comments decreased stance and heel to  toe gait on R LE; pivots too quick                        Objective measurements completed on examination: See above findings.               PT Education - 04/12/21 1100     Education Details discussed HEP compliance with  HHPT exercises for now, discussed straight adjustable cane benefit vs pronged straight cane, discussed home health care assessment for bathroom modifications and getting in touch with prior [facility] agency; discussed [employer] for home health assessment if home health agency declined. PT also discussed decreasing cadence of pivot with gait due to unsteadiness with turns. Pt stated she has always turned quickly; PT demonstrated on herself how pt is increasing risk of torquing knee with quick pivot and of increasing falls risk. Pt agreed to decrease turn velocity. Also discussed sleeping with a pillow between knees due to pt being a side sleeper.    Person(s) Educated Patient    Methods Explanation    Comprehension Verbalized understanding              PT Short Term Goals - 04/12/21 1219       PT SHORT TERM GOAL #1   Title Pt will be indep with HEP    Baseline not issued at eval    Time 4    Period Weeks    Status New    Target Date 05/10/21      PT SHORT TERM GOAL #2   Title Pt will be able to stand x 20 min with 50% less difficulty    Baseline 10 min    Time 4    Period Weeks    Status New    Target Date 05/10/21      PT SHORT TERM GOAL #3   Title Pt will report improved R knee pain to </=4/10 pain with functional mobility    Baseline up to 8/10    Time 4    Period Weeks    Status New    Target Date 05/10/21  PT Long Term Goals - 04/12/21 1215       PT LONG TERM GOAL #1   Title Pt will demo improved R knee ext at rest </=-10 degrees to assist with more normalized gait    Baseline at rest, R knee -26 degrees ext    Time 8    Period Weeks    Status New    Target Date 06/07/21      PT  LONG TERM GOAL #2   Title Pt will demo improved R knee flexion to >/= 115 degrees to assist with more normalized transfers    Baseline R knee flex 107    Time 8    Period Weeks    Status New    Target Date 06/07/21      PT LONG TERM GOAL #3   Title Pt will demo improved R LE strength >/=1/2 MMT grade to assist with improved functional mobility    Baseline R hip flex 2+/5, all others 4-/5.    Time 8    Period Weeks    Status New    Target Date 06/07/21      PT LONG TERM GOAL #4   Title Pt will be able to ambulate with LRAD/no AD with improved heel to toe gait x 30 min to shop at the grocery store    Baseline 15 min with minimal heel to toe gait    Time 8    Period Weeks    Status New    Target Date 06/07/21                    Plan - 04/12/21 1223     Clinical Impression Statement Pt presents to PT s/p R TKR 03/08/21. Pt had HHPT but only did exercises when therapists was present; she was not compliant with performing HEP. PT discussed importance of performing HEP and not just using housework as means to work on motion. PT also discussed small window of time prior to loss of available motion after knee replacement. Pt/daughter stated understanding. Pt amb without assistive device and is unsteady with turns with turns being performed too quickly. Pt demos decreased R knee AROM and strength and lack of heel toe gait with  mobility. Pt to look at buying cane (see pt education section) but PT stated having outpatient discuss how to use cane with pt prior to usage for safety. Pt would benefit from PT for therex for ROM and strengthening, GT, Stair training, and manual therapy/modalities as needed. Pt to bring in [place] next visit.    Personal Factors and Comorbidities Comorbidity 1    Comorbidities PVCs, afib, R plantar fasciitis    Examination-Activity Limitations Bathing;Hygiene/Grooming;Stairs;Locomotion Level;Stand;Transfers    Examination-Participation Restrictions  Shop;Community Activity    Stability/Clinical Decision Making Stable/Uncomplicated    Clinical Decision Making Low    Rehab Potential Good    PT Frequency 2x / week    PT Duration 8 weeks    PT Treatment/Interventions ADLs/Self Care Home Management;Cryotherapy;Electrical Stimulation;DME Instruction;Gait training;Stair training;Balance training;Therapeutic exercise;Therapeutic activities;Functional mobility training;Neuromuscular re-education;Patient/family education;Manual techniques;Scar mobilization;Passive range of motion;Dry needling;Vasopneumatic Device;Taping    PT Next Visit Plan Review HHPT, did pt buy cane, work on R knee AROM and strengthening    Consulted and Agree with Plan of Care Patient;Family member/caregiver    Family Member Consulted daughter             Patient will benefit from skilled therapeutic intervention in order to improve the following deficits  and impairments:  Abnormal gait, Decreased balance, Decreased endurance, Decreased mobility, Difficulty walking, Decreased scar mobility, Decreased range of motion, Decreased activity tolerance, Decreased strength, Impaired flexibility, Pain  Visit Diagnosis: Acute pain of right knee  Muscle weakness (generalized)  Other abnormalities of gait and mobility  Unsteadiness on feet     Problem List Patient Active Problem List   Diagnosis Date Noted   Primary osteoarthritis of right knee 03/08/2021   Status post total right knee replacement 03/08/2021   Lactic acidosis 01/13/2020   Elevated troponin 01/13/2020   Demand ischemia ([place]) 01/13/2020   AKI (acute kidney injury) ([place]) 01/13/2020   Atrial fibrillation ([place]) 01/12/2020   Physical exam 07/02/2015   Eustachian tube dysfunction 05/29/2015   Hyperlipidemia 06/04/2013   PVCs (premature ventricular contractions) 06/04/2013   Snoring 11/08/2011   Sleep disturbance 11/08/2011   Need for prophylactic vaccination and inoculation against influenza 11/08/2011    Essential hypertension, benign 07/13/2011   Tobacco use disorder 07/13/2011    America Brown, PT, DPT 04/12/2021, 12:34 PM  [facility] [address] [place], [place], [address] Phone: [phone]   Fax:  [phone]  Name: Tonya [name] MRN: [id number] Date of Birth: 01/02/1946

## 2021-04-16 ENCOUNTER — Other Ambulatory Visit: Payer: Self-pay

## 2021-04-16 ENCOUNTER — Ambulatory Visit: Payer: Medicare HMO | Admitting: Rehabilitative and Restorative Service Providers"

## 2021-04-16 ENCOUNTER — Encounter: Payer: Self-pay | Admitting: Rehabilitative and Restorative Service Providers"

## 2021-04-16 DIAGNOSIS — M6281 Muscle weakness (generalized): Secondary | ICD-10-CM

## 2021-04-16 DIAGNOSIS — R262 Difficulty in walking, not elsewhere classified: Secondary | ICD-10-CM | POA: Diagnosis not present

## 2021-04-16 DIAGNOSIS — M25661 Stiffness of right knee, not elsewhere classified: Secondary | ICD-10-CM

## 2021-04-16 DIAGNOSIS — G8929 Other chronic pain: Secondary | ICD-10-CM

## 2021-04-16 DIAGNOSIS — M25561 Pain in right knee: Secondary | ICD-10-CM | POA: Diagnosis not present

## 2021-04-16 DIAGNOSIS — R600 Localized edema: Secondary | ICD-10-CM

## 2021-04-16 NOTE — Therapy (Signed)
[facility] [address] [place], [place], [address] Phone: [phone]   Fax:  [phone]  Physical Therapy Treatment  Patient Details  Name: Tonya [name] MRN: [id number] Date of Birth: [birth year] Referring Provider (PT): [place], [place]   Encounter Date: 04/16/2021   PT End of Session - 04/16/21 1012     Visit Number 2    Number of Visits 16    Date for PT Re-Evaluation 06/07/21    Authorization Type [employer] MCR    Progress Note Due on Visit 10    PT Start Time 0930    PT Stop Time 1018    PT Time Calculation (min) 48 min    Activity Tolerance Patient tolerated treatment well;No increased pain    Behavior During Therapy WFL for tasks assessed/performed             Past Medical History:  Diagnosis Date   Allergy    Dysrhythmia    Hyperlipidemia    Hypertension    [name] disease    per her report from skin biopsy prior   Osteoarthritis    knee, hips   Plantar fasciitis    right foot   Tobacco use disorder     Past Surgical History:  Procedure Laterality Date   ABDOMINAL HYSTERECTOMY     partial   CHOLECYSTECTOMY  2003   COLONOSCOPY  2010   TOTAL KNEE ARTHROPLASTY Right 03/08/2021   Procedure: RIGHT TOTAL KNEE ARTHROPLASTY;  Surgeon: Leandrew Koyanagi, MD;  Location: [place];  Service: Orthopedics;  Laterality: Right;   WISDOM TOOTH EXTRACTION      There were no vitals filed for this visit.   Subjective Assessment - 04/16/21 0937     Subjective Tonya [name] is not taking pain medication.  She reports early HEP compliance.    Patient is accompained by: Family member   daughter   Pertinent History R TKR 03/08/21, R plantar fasciitis    Limitations Sitting;House hold activities;Lifting;Standing;Walking    How long can you sit comfortably? indeterminate amount of time    How long can you stand comfortably? 10 min    How long can you walk comfortably? 15 min    Patient Stated Goals to be able to walk without pain     Currently in Pain? Yes    Pain Score 3     Pain Location Knee    Pain Orientation Right    Pain Descriptors / Indicators Tightness;Sore;Aching    Pain Type Surgical pain;Chronic pain    Pain Radiating Towards NA    Pain Onset More than a month ago    Pain Frequency Intermittent    Aggravating Factors  Sleeping is interrupted    Pain Relieving Factors Exercises and movement    Effect of Pain on Daily Activities Limits standing and walking endurance and affects sleep    Multiple Pain Sites No                [facility] PT Assessment - 04/16/21 0001       AROM   Overall AROM Comments R knee extension -15 and flexion 118 degrees                           [facility] Adult PT Treatment/Exercise - 04/16/21 0001       Exercises   Exercises Knee/Hip      Knee/Hip Exercises: Stretches   Other Knee/Hip Stretches Seated knee extension stretch (sit on high/low table  with foot in a chair-do not externally rotate the hip/foot-and provide self-overpressure above the knee into extension) 3 minutes      Knee/Hip Exercises: Aerobic   Recumbent Bike Seat 7 for 8 minutes      Knee/Hip Exercises: Machines for Strengthening   Total Gym Leg Press 75# slow eccentrics full extension to stretch in flexion 15X 2 sets      Knee/Hip Exercises: Supine   Quad Sets Strengthening;Both;3 sets;10 reps;Limitations    Quad Sets Limitations 5 seconds (toes back, press knees down, tighten thighs)      Modalities   Modalities Vasopneumatic      Vasopneumatic   Number Minutes Vasopneumatic  10 minutes    Vasopnuematic Location  Knee    Vasopneumatic Pressure Medium    Vasopneumatic Temperature  34*                    PT Education - 04/16/21 1011     Education Details Talked about the focus on extension AROM, quadriceps strength and edema control.  Reviewed and provided a HEP.    Person(s) Educated Patient    Methods Explanation;Demonstration;Tactile cues;Verbal cues;Handout     Comprehension Verbal cues required;Need further instruction;Returned demonstration;Verbalized understanding;Tactile cues required              PT Short Term Goals - 04/16/21 1012       PT SHORT TERM GOAL #1   Title Pt will be indep with HEP    Baseline Issued visit 2    Time 4    Period Weeks    Status New    Target Date 05/10/21      PT SHORT TERM GOAL #2   Title Pt will be able to stand x 20 min with 50% less difficulty    Baseline 10 min    Time 4    Period Weeks    Status New    Target Date 05/10/21      PT SHORT TERM GOAL #3   Title Pt will report improved R knee pain to </=4/10 pain with functional mobility    Baseline up to 8/10    Time 4    Period Weeks    Status New    Target Date 05/10/21               PT Long Term Goals - 04/12/21 1215       PT LONG TERM GOAL #1   Title Pt will demo improved R knee ext at rest </=-10 degrees to assist with more normalized gait    Baseline at rest, R knee -26 degrees ext    Time 8    Period Weeks    Status New    Target Date 06/07/21      PT LONG TERM GOAL #2   Title Pt will demo improved R knee flexion to >/= 115 degrees to assist with more normalized transfers    Baseline R knee flex 107    Time 8    Period Weeks    Status New    Target Date 06/07/21      PT LONG TERM GOAL #3   Title Pt will demo improved R LE strength >/=1/2 MMT grade to assist with improved functional mobility    Baseline R hip flex 2+/5, all others 4-/5.    Time 8    Period Weeks    Status New    Target Date 06/07/21      PT LONG TERM  GOAL #4   Title Pt will be able to ambulate with LRAD/no AD with improved heel to toe gait x 30 min to shop at the grocery store    Baseline 15 min with minimal heel to toe gait    Time 8    Period Weeks    Status New    Target Date 06/07/21                   Plan - 04/16/21 1013     Clinical Impression Statement Tonya [name] is making good early progress with her post-TKA  rehabilitation.  -15 to 118 degrees for AROM, thus extension AROM and strength are the primary focus of her early rehabilitation.  Both are improved from evaluation.    Personal Factors and Comorbidities Comorbidity 1    Comorbidities PVCs, afib, R plantar fasciitis    Examination-Activity Limitations Bathing;Hygiene/Grooming;Stairs;Locomotion Level;Stand;Transfers    Examination-Participation Restrictions Shop;Community Activity    Stability/Clinical Decision Making Stable/Uncomplicated    Rehab Potential Good    PT Frequency 2x / week    PT Duration 8 weeks    PT Treatment/Interventions ADLs/Self Care Home Management;Cryotherapy;Electrical Stimulation;DME Instruction;Gait training;Stair training;Balance training;Therapeutic exercise;Therapeutic activities;Functional mobility training;Neuromuscular re-education;Patient/family education;Manual techniques;Scar mobilization;Passive range of motion;Dry needling;Vasopneumatic Device;Taping    PT Next Visit Plan Knee extension AROM and strength    PT Home Exercise Plan Access Code: [email or web address]    Consulted and Agree with Plan of Care Patient    Family Member Consulted --             Patient will benefit from skilled therapeutic intervention in order to improve the following deficits and impairments:  Abnormal gait, Decreased balance, Decreased endurance, Decreased mobility, Difficulty walking, Decreased scar mobility, Decreased range of motion, Decreased activity tolerance, Decreased strength, Impaired flexibility, Pain  Visit Diagnosis: Difficulty in walking, not elsewhere classified  Muscle weakness (generalized)  Stiffness of right knee, not elsewhere classified  Chronic pain of right knee  Localized edema     Problem List Patient Active Problem List   Diagnosis Date Noted   Primary osteoarthritis of right knee 03/08/2021   Status post total right knee replacement 03/08/2021   Lactic acidosis 01/13/2020   Elevated troponin  01/13/2020   Demand ischemia ([place]) 01/13/2020   AKI (acute kidney injury) ([place]) 01/13/2020   Atrial fibrillation ([place]) 01/12/2020   Physical exam 07/02/2015   Eustachian tube dysfunction 05/29/2015   Hyperlipidemia 06/04/2013   PVCs (premature ventricular contractions) 06/04/2013   Snoring 11/08/2011   Sleep disturbance 11/08/2011   Need for prophylactic vaccination and inoculation against influenza 11/08/2011   Essential hypertension, benign 07/13/2011   Tobacco use disorder 07/13/2011    Farley Ly PT, MPT 04/16/2021, 4:35 PM  [facility] [address] [place], [place], [address] Phone: [phone]   Fax:  [phone]  Name: Tonya [name] MRN: [id number] Date of Birth: Aug 10, 1946

## 2021-04-16 NOTE — Patient Instructions (Signed)
Access Code: [email or web address] URL: [email or web address] Date: 04/16/2021 Prepared by: Vista Mink  Exercises Supine Quadricep Sets - 3-5 x daily - 7 x weekly - 2-3 sets - 10 reps - 5 second hold Seated Passive Knee Extension - 3-5 x daily - 7 x weekly - 1 sets - 1 reps - 3 minutes hold

## 2021-04-19 ENCOUNTER — Other Ambulatory Visit: Payer: Self-pay

## 2021-04-19 ENCOUNTER — Ambulatory Visit (INDEPENDENT_AMBULATORY_CARE_PROVIDER_SITE_OTHER): Payer: Medicare HMO | Admitting: Physical Therapy

## 2021-04-19 DIAGNOSIS — M25661 Stiffness of right knee, not elsewhere classified: Secondary | ICD-10-CM | POA: Diagnosis not present

## 2021-04-19 DIAGNOSIS — M25561 Pain in right knee: Secondary | ICD-10-CM

## 2021-04-19 DIAGNOSIS — R262 Difficulty in walking, not elsewhere classified: Secondary | ICD-10-CM

## 2021-04-19 DIAGNOSIS — R2681 Unsteadiness on feet: Secondary | ICD-10-CM

## 2021-04-19 DIAGNOSIS — G8929 Other chronic pain: Secondary | ICD-10-CM

## 2021-04-19 DIAGNOSIS — M6281 Muscle weakness (generalized): Secondary | ICD-10-CM | POA: Diagnosis not present

## 2021-04-19 DIAGNOSIS — R600 Localized edema: Secondary | ICD-10-CM

## 2021-04-19 DIAGNOSIS — R2689 Other abnormalities of gait and mobility: Secondary | ICD-10-CM

## 2021-04-19 NOTE — Therapy (Signed)
[facility] [address] [place], [place], [address] Phone: [phone]   Fax:  [phone]  Physical Therapy Treatment  Patient Details  Name: Tonya [name] MRN: [id number] Date of Birth: Aug 25, 1946 Referring Provider (PT): Dwana Melena PA-C   Encounter Date: 04/19/2021   PT End of Session - 04/19/21 1217     Visit Number 3    Number of Visits 16    Date for PT Re-Evaluation 06/07/21    Authorization Type Aetna MCR    Progress Note Due on Visit 10    PT Start Time 3875    PT Stop Time 1228    PT Time Calculation (min) 46 min    Activity Tolerance Patient tolerated treatment well;No increased pain    Behavior During Therapy WFL for tasks assessed/performed             Past Medical History:  Diagnosis Date   Allergy    Dysrhythmia    Hyperlipidemia    Hypertension    [name] disease    per her report from skin biopsy prior   Osteoarthritis    knee, hips   Plantar fasciitis    right foot   Tobacco use disorder     Past Surgical History:  Procedure Laterality Date   ABDOMINAL HYSTERECTOMY     partial   CHOLECYSTECTOMY  2003   COLONOSCOPY  2010   TOTAL KNEE ARTHROPLASTY Right 03/08/2021   Procedure: RIGHT TOTAL KNEE ARTHROPLASTY;  Surgeon: Leandrew Koyanagi, MD;  Location: [place];  Service: Orthopedics;  Laterality: Right;   WISDOM TOOTH EXTRACTION      There were no vitals filed for this visit.   Subjective Assessment - 04/19/21 1139     Subjective Pt arriving today reporting no pain at present, but reported this morning her pain was high and her knee was swollen.    Pertinent History R TKR 03/08/21, R plantar fasciitis    Limitations Sitting;House hold activities;Lifting;Standing;Walking    How long can you sit comfortably? indeterminate amount of time    Patient Stated Goals to be able to walk without pain    Currently in Pain? No/denies                [facility] PT Assessment - 04/19/21 0001        Assessment   Medical Diagnosis R TKA    Referring Provider (PT) Dwana Melena PA-C    Onset Date/Surgical Date 03/08/21      ROM / Strength   AROM / PROM / Strength PROM      AROM   AROM Assessment Site Knee    Right/Left Knee Right    Right Knee Extension -10    Right Knee Flexion 116      PROM   PROM Assessment Site Knee    Right/Left Knee Right    Right Knee Extension -6    Right Knee Flexion 120                           [facility] Adult PT Treatment/Exercise - 04/19/21 0001       Exercises   Exercises Knee/Hip      Knee/Hip Exercises: Stretches   Active Hamstring Stretch Right;2 reps;30 seconds    Gastroc Stretch 2 reps;20 seconds    Gastroc Stretch Limitations slant board    Other Knee/Hip Stretches Tailgate using L LE to push right knee into flexion and holding 10 seconds x 10 reps  Knee/Hip Exercises: Aerobic   Recumbent Bike seat 7, L4 x 10 minutes      Knee/Hip Exercises: Machines for Strengthening   Total Gym Leg Press bilateral LE's: 100# 3x10, R LE only: 50# 2x10      Knee/Hip Exercises: Standing   Terminal Knee Extension Strengthening;Right;Limitations;Theraband;1 set;15 reps    Theraband Level (Terminal Knee Extension) Level 3 (Green)    Terminal Knee Extension Limitations Pt needed constant verbal cues for technique      Knee/Hip Exercises: Seated   Long Arc Quad Strengthening;Right;2 sets;10 reps    Long Arc Quad Weight 2 lbs.    Sit to Sand 10 reps;with UE support      Knee/Hip Exercises: Supine   Quad Sets AROM;Right;10 reps    Quad Sets Limitations overpressure by therapist holding 5 seconds each    Bridges Strengthening;1 set;10 reps    Bridges Limitations holding 5 seconds    Straight Leg Raises Strengthening;Right;2 sets;10 reps      Knee/Hip Exercises: Prone   Other Prone Exercises instructed in prone knee hangs to add to her HEP.      Modalities   Modalities Vasopneumatic      Vasopneumatic   Number Minutes  Vasopneumatic  10 minutes    Vasopnuematic Location  Knee    Vasopneumatic Pressure Medium    Vasopneumatic Temperature  34      Manual Therapy   Manual therapy comments PROM R knee flexion/extension                      PT Short Term Goals - 04/19/21 1222       PT SHORT TERM GOAL #1   Title Pt will be indep with HEP    Baseline Issued visit 2    Status On-going      PT SHORT TERM GOAL #2   Title Pt will be able to stand x 20 min with 50% less difficulty    Status On-going      PT SHORT TERM GOAL #3   Title Pt will report improved R knee pain to </=4/10 pain with functional mobility    Status On-going               PT Long Term Goals - 04/19/21 1222       PT LONG TERM GOAL #1   Title Pt will demo improved R knee ext at rest </=-10 degrees to assist with more normalized gait    Status On-going      PT LONG TERM GOAL #2   Title Pt will demo improved R knee flexion to >/= 115 degrees to assist with more normalized transfers    Baseline active R knee flexion 116 on 04/19/2021    Status Achieved      PT LONG TERM GOAL #3   Title Pt will demo improved R LE strength >/=1/2 MMT grade to assist with improved functional mobility    Status On-going      PT LONG TERM GOAL #4   Title Pt will be able to ambulate with LRAD/no AD with improved heel to toe gait x 30 min to shop at the grocery store    Status On-going                   Plan - 04/19/21 1218     Clinical Impression Statement Pt s/p right TKA with no pain upon arrival. Pt still reporting pain and swelling when she woke up this morning.  Pt also reporting intermittent pain with certain activities throughout her day. Pt's active ROM measured today -10-116 degrees. Continue skilled PT focusing on improved active range and strengthening to maximize functional mobility.    Personal Factors and Comorbidities Comorbidity 1    Examination-Activity Limitations Bathing;Hygiene/Grooming;Stairs;Locomotion  Level;Stand;Transfers    Examination-Participation Restrictions Shop;Community Activity    Stability/Clinical Decision Making Stable/Uncomplicated    Rehab Potential Good    PT Frequency 2x / week    PT Duration 8 weeks    PT Treatment/Interventions ADLs/Self Care Home Management;Cryotherapy;Electrical Stimulation;DME Instruction;Gait training;Stair training;Balance training;Therapeutic exercise;Therapeutic activities;Functional mobility training;Neuromuscular re-education;Patient/family education;Manual techniques;Scar mobilization;Passive range of motion;Dry needling;Vasopneumatic Device;Taping    PT Next Visit Plan ROM and strengthending, gait training and balance    PT Home Exercise Plan Access Code: [email or web address]    Consulted and Agree with Plan of Care Patient             Patient will benefit from skilled therapeutic intervention in order to improve the following deficits and impairments:  Abnormal gait, Decreased balance, Decreased endurance, Decreased mobility, Difficulty walking, Decreased scar mobility, Decreased range of motion, Decreased activity tolerance, Decreased strength, Impaired flexibility, Pain  Visit Diagnosis: Difficulty in walking, not elsewhere classified  Muscle weakness (generalized)  Stiffness of right knee, not elsewhere classified  Chronic pain of right knee  Localized edema  Acute pain of right knee  Other abnormalities of gait and mobility  Unsteadiness on feet     Problem List Patient Active Problem List   Diagnosis Date Noted   Primary osteoarthritis of right knee 03/08/2021   Status post total right knee replacement 03/08/2021   Lactic acidosis 01/13/2020   Elevated troponin 01/13/2020   Demand ischemia ([place]) 01/13/2020   AKI (acute kidney injury) ([place]) 01/13/2020   Atrial fibrillation ([place]) 01/12/2020   Physical exam 07/02/2015   Eustachian tube dysfunction 05/29/2015   Hyperlipidemia 06/04/2013   PVCs (premature ventricular  contractions) 06/04/2013   Snoring 11/08/2011   Sleep disturbance 11/08/2011   Need for prophylactic vaccination and inoculation against influenza 11/08/2011   Essential hypertension, benign 07/13/2011   Tobacco use disorder 07/13/2011    Oretha Caprice, PT, MPT 04/19/2021, 12:26 PM  [facility] [address] [place], [place], [address] Phone: [phone]   Fax:  [phone]  Name: Tonya [name] MRN: [id number] Date of Birth: 05/20/1946

## 2021-04-21 ENCOUNTER — Encounter: Payer: Medicare HMO | Admitting: Physical Therapy

## 2021-04-23 ENCOUNTER — Ambulatory Visit (INDEPENDENT_AMBULATORY_CARE_PROVIDER_SITE_OTHER): Payer: Medicare HMO

## 2021-04-23 ENCOUNTER — Ambulatory Visit (INDEPENDENT_AMBULATORY_CARE_PROVIDER_SITE_OTHER): Payer: Medicare HMO | Admitting: Orthopaedic Surgery

## 2021-04-23 ENCOUNTER — Other Ambulatory Visit: Payer: Self-pay

## 2021-04-23 DIAGNOSIS — Z96651 Presence of right artificial knee joint: Secondary | ICD-10-CM

## 2021-04-23 NOTE — Progress Notes (Signed)
Post-Op Visit Note   Patient: Tonya [name]           Date of Birth: [birth year]           MRN: [id number] Visit Date: 04/23/2021 PCP: Alroy Dust, L.Marlou Sa, MD   Assessment & Plan:  Chief Complaint:  Chief Complaint  Patient presents with   Right Knee - Pain, Routine Post Op   Visit Diagnoses:  1. S/P total knee replacement, right     Plan: Patient is a pleasant 75 year old female who comes in today 6 weeks out right total knee replacement 03/08/2021.  She has been doing well.  She has been in some pain and is taking occasional over-the-counter pain medication as well as oxycodone.  She is in physical therapy making great progress.  Overall doing well.  Examination of her right knee reveals a fully healed surgical scar without complication.  Calf soft nontender.  She stable valgus varus stress.  Range of motion 0 to 120 degrees.  At this point, she will wean from physical therapy when ready.  Follow-up with [place] in 6 weeks time for recheck.  Dental prophylaxis reinforced.  Call with concerns or questions.   Follow-Up Instructions: Return in about 6 weeks (around 06/04/2021).   Orders:  Orders Placed This Encounter  Procedures   XR Knee 1-2 Views Right   No orders of the defined types were placed in this encounter.   Imaging: XR Knee 1-2 Views Right  Result Date: 04/23/2021 Well-seated prosthesis without complication   PMFS History: Patient Active Problem List   Diagnosis Date Noted   Primary osteoarthritis of right knee 03/08/2021   Status post total right knee replacement 03/08/2021   Lactic acidosis 01/13/2020   Elevated troponin 01/13/2020   Demand ischemia ([place]) 01/13/2020   AKI (acute kidney injury) ([place]) 01/13/2020   Atrial fibrillation ([place]) 01/12/2020   Physical exam 07/02/2015   Eustachian tube dysfunction 05/29/2015   Hyperlipidemia 06/04/2013   PVCs (premature ventricular contractions) 06/04/2013   Snoring 11/08/2011   Sleep disturbance 11/08/2011   Need for  prophylactic vaccination and inoculation against influenza 11/08/2011   Essential hypertension, benign 07/13/2011   Tobacco use disorder 07/13/2011   Past Medical History:  Diagnosis Date   Allergy    Dysrhythmia    Hyperlipidemia    Hypertension    [name] disease    per her report from skin biopsy prior   Osteoarthritis    knee, hips   Plantar fasciitis    right foot   Tobacco use disorder     Family History  Problem Relation Age of Onset   Hypertension Mother    Stroke Mother        multiple   Heart disease Father        reportedly died of MI   Pneumonia Brother        died of pneumonia   Other Brother        died in surgery, reportedly due to lung disease   Liver disease Brother        died of NASH    Past Surgical History:  Procedure Laterality Date   ABDOMINAL HYSTERECTOMY     partial   CHOLECYSTECTOMY  2003   COLONOSCOPY  2010   TOTAL KNEE ARTHROPLASTY Right 03/08/2021   Procedure: RIGHT TOTAL KNEE ARTHROPLASTY;  Surgeon: Leandrew Koyanagi, MD;  Location: [place];  Service: Orthopedics;  Laterality: Right;   WISDOM TOOTH EXTRACTION     Social History   Occupational  History   Not on file  Tobacco Use   Smoking status: Every Day    Packs/day: 1.00    Years: 50.00    Pack years: 50.00    Types: Cigarettes   Smokeless tobacco: Never  Vaping Use   Vaping Use: Never used  Substance and Sexual Activity   Alcohol use: Not Currently    Alcohol/week: 0.0 standard drinks    Comment: occ   Drug use: No   Sexual activity: Never

## 2021-04-26 ENCOUNTER — Other Ambulatory Visit: Payer: Self-pay

## 2021-04-26 ENCOUNTER — Ambulatory Visit: Payer: Medicare HMO | Admitting: Physical Therapy

## 2021-04-26 ENCOUNTER — Encounter: Payer: Self-pay | Admitting: Physical Therapy

## 2021-04-26 DIAGNOSIS — M25561 Pain in right knee: Secondary | ICD-10-CM

## 2021-04-26 DIAGNOSIS — R262 Difficulty in walking, not elsewhere classified: Secondary | ICD-10-CM | POA: Diagnosis not present

## 2021-04-26 DIAGNOSIS — M25661 Stiffness of right knee, not elsewhere classified: Secondary | ICD-10-CM | POA: Diagnosis not present

## 2021-04-26 DIAGNOSIS — M6281 Muscle weakness (generalized): Secondary | ICD-10-CM | POA: Diagnosis not present

## 2021-04-26 DIAGNOSIS — G8929 Other chronic pain: Secondary | ICD-10-CM

## 2021-04-26 DIAGNOSIS — R2689 Other abnormalities of gait and mobility: Secondary | ICD-10-CM

## 2021-04-26 DIAGNOSIS — R600 Localized edema: Secondary | ICD-10-CM

## 2021-04-26 NOTE — Therapy (Signed)
[facility] [address] [place], [place], [address] Phone: [phone]   Fax:  [phone]  Physical Therapy Treatment  Patient Details  Name: Tonya [name] MRN: [id number] Date of Birth: Apr 29, 1946 Referring Provider (PT): Dwana Melena PA-C   Encounter Date: 04/26/2021   PT End of Session - 04/26/21 1152     Visit Number 4    Number of Visits 16    Date for PT Re-Evaluation 06/07/21    Authorization Type Aet[employer] MCR    Progress Note Due on Visit 10    PT Start Time [id number]    PT Stop Time 1230    PT Time Calculation (min) 45 min    Activity Tolerance Patient tolerated treatment well;No increased pain    Behavior During Therapy WFL for tasks assessed/performed             Past Medical History:  Diagnosis Date   Allergy    Dysrhythmia    Hyperlipidemia    Hypertension    [name] disease    per her report from skin biopsy prior   Osteoarthritis    knee, hips   Plantar fasciitis    right foot   Tobacco use disorder     Past Surgical History:  Procedure Laterality Date   ABDOMINAL HYSTERECTOMY     partial   CHOLECYSTECTOMY  2003   COLONOSCOPY  2010   TOTAL KNEE ARTHROPLASTY Right 03/08/2021   Procedure: RIGHT TOTAL KNEE ARTHROPLASTY;  Surgeon: Leandrew Koyanagi, MD;  Location: [place];  Service: Orthopedics;  Laterality: Right;   WISDOM TOOTH EXTRACTION      There were no vitals filed for this visit.   Subjective Assessment - 04/26/21 1151     Subjective Pt arriving reproting stiffness and no pain.    Pertinent History R TKR 03/08/21, R plantar fasciitis    Limitations Sitting;House hold activities;Lifting;Standing;Walking    How long can you sit comfortably? indeterminate amount of time    Patient Stated Goals to be able to walk without pain    Currently in Pain? No/denies                [facility] PT Assessment - 04/26/21 0001       Assessment   Medical Diagnosis R TKA    Referring Provider (PT) Dwana Melena PA-C    Onset Date/Surgical Date 03/08/21      AROM   AROM Assessment Site Knee    Right/Left Knee Right    Right Knee Extension -8    Right Knee Flexion 120      PROM   PROM Assessment Site Knee    Right/Left Knee Right    Right Knee Extension -6    Right Knee Flexion 122                           [facility] Adult PT Treatment/Exercise - 04/26/21 0001       Exercises   Exercises Knee/Hip      Knee/Hip Exercises: Stretches   Gastroc Stretch 2 reps;20 seconds    Gastroc Stretch Limitations slant board    Other Knee/Hip Stretches Tailgate using L LE to push right knee into flexion and holding 10 seconds x 10 reps      Knee/Hip Exercises: Aerobic   Recumbent Bike L4 x 10 minutes, seat at 7      Knee/Hip Exercises: Machines for Strengthening   Total Gym Leg Press bilateral LE's: 100# 3x10,  R LE only: 50# 2x10      Knee/Hip Exercises: Standing   Terminal Knee Extension Strengthening;Right;Limitations;Theraband;1 set;15 reps    Theraband Level (Terminal Knee Extension) Level 3 (Green)      Knee/Hip Exercises: Seated   Long Arc Quad Strengthening;Right;2 sets;10 reps    Long Arc Quad Weight 3 lbs.    Sit to [employer] 10 reps;with UE support      Knee/Hip Exercises: Supine   Straight Leg Raises Strengthening;Right;2 sets;10 reps      Modalities   Modalities Vasopneumatic      Vasopneumatic   Number Minutes Vasopneumatic  10 minutes    Vasopnuematic Location  Knee    Vasopneumatic Pressure Medium    Vasopneumatic Temperature  34                      PT Short Term Goals - 04/26/21 1202       PT SHORT TERM GOAL #1   Title Pt will be indep with HEP    Status On-going      PT SHORT TERM GOAL #2   Title Pt will be able to stand x 20 min with 50% less difficulty    Baseline Pt stating she can stand 20 minutes, but her knee feels heavy    Status On-going      PT SHORT TERM GOAL #3   Title Pt will report improved R knee pain to </=4/10 pain  with functional mobility    Status On-going               PT Long Term Goals - 04/26/21 1203       PT LONG TERM GOAL #1   Title Pt will demo improved R knee ext at rest </=-10 degrees to assist with more normalized gait    Status On-going      PT LONG TERM GOAL #2   Title Pt will demo improved R knee flexion to >/= 115 degrees to assist with more normalized transfers    Status Achieved      PT LONG TERM GOAL #3   Title Pt will demo improved R LE strength >/=1/2 MMT grade to assist with improved functional mobility    Status On-going      PT LONG TERM GOAL #4   Title Pt will be able to ambulate with LRAD/no AD with improved heel to toe gait x 30 min to shop at the grocery store    Status On-going                   Plan - 04/26/21 1153     Clinical Impression Statement Pt reporting no pain today upon arrival only stiffness in her right knee. Pt tolerating exercises well. Pt still with limited extension by -8 degrees actively. Pt stating she has been doing the prone knee hangs off her bed. Flexion range measure today at 120 degrees actively. Cotninue skilled PT to progress ROM and overall strengthening.    Personal Factors and Comorbidities Comorbidity 1    Examination-Activity Limitations Bathing;Hygiene/Grooming;Stairs;Locomotion Level;Stand;Transfers    Examination-Participation Restrictions Shop;Community Activity    Stability/Clinical Decision Making Stable/Uncomplicated    Rehab Potential Good    PT Frequency 2x / week    PT Duration 8 weeks    PT Treatment/Interventions ADLs/Self Care Home Management;Cryotherapy;Electrical Stimulation;DME Instruction;Gait training;Stair training;Balance training;Therapeutic exercise;Therapeutic activities;Functional mobility training;Neuromuscular re-education;Patient/family education;Manual techniques;Scar mobilization;Passive range of motion;Dry needling;Vasopneumatic Device;Taping    PT Next Visit Plan ROM and strengthening,  gait training and balance    PT Home Exercise Plan Access Code: [email or web address]    Consulted and Agree with Plan of Care Patient             Patient will benefit from skilled therapeutic intervention in order to improve the following deficits and impairments:  Abnormal gait, Decreased balance, Decreased endurance, Decreased mobility, Difficulty walking, Decreased scar mobility, Decreased range of motion, Decreased activity tolerance, Decreased strength, Impaired flexibility, Pain  Visit Diagnosis: Difficulty in walking, not elsewhere classified  Muscle weakness (generalized)  Stiffness of right knee, not elsewhere classified  Chronic pain of right knee  Localized edema  Acute pain of right knee  Other abnormalities of gait and mobility     Problem List Patient Active Problem List   Diagnosis Date Noted   Primary osteoarthritis of right knee 03/08/2021   Status post total right knee replacement 03/08/2021   Lactic acidosis 01/13/2020   Elevated troponin 01/13/2020   Demand ischemia ([place]) 01/13/2020   AKI (acute kidney injury) ([place]) 01/13/2020   Atrial fibrillation ([place]) 01/12/2020   Physical exam 07/02/2015   Eustachian tube dysfunction 05/29/2015   Hyperlipidemia 06/04/2013   PVCs (premature ventricular contractions) 06/04/2013   Snoring 11/08/2011   Sleep disturbance 11/08/2011   Need for prophylactic vaccination and inoculation against influenza 11/08/2011   Essential hypertension, benign 07/13/2011   Tobacco use disorder 07/13/2011    Oretha Caprice, PT, MPT 04/26/2021, 12:18 PM  [facility] [address] [place], [place], [address] Phone: [phone]   Fax:  [phone]  Name: Tonya [name] MRN: [id number] Date of Birth: 29-Jul-1946

## 2021-04-28 ENCOUNTER — Ambulatory Visit: Payer: Medicare HMO | Admitting: Physical Therapy

## 2021-04-28 ENCOUNTER — Encounter: Payer: Self-pay | Admitting: Physical Therapy

## 2021-04-28 ENCOUNTER — Other Ambulatory Visit: Payer: Self-pay

## 2021-04-28 DIAGNOSIS — G8929 Other chronic pain: Secondary | ICD-10-CM

## 2021-04-28 DIAGNOSIS — R2681 Unsteadiness on feet: Secondary | ICD-10-CM

## 2021-04-28 DIAGNOSIS — R2689 Other abnormalities of gait and mobility: Secondary | ICD-10-CM

## 2021-04-28 DIAGNOSIS — R262 Difficulty in walking, not elsewhere classified: Secondary | ICD-10-CM | POA: Diagnosis not present

## 2021-04-28 DIAGNOSIS — M6281 Muscle weakness (generalized): Secondary | ICD-10-CM

## 2021-04-28 DIAGNOSIS — M25661 Stiffness of right knee, not elsewhere classified: Secondary | ICD-10-CM

## 2021-04-28 DIAGNOSIS — M25561 Pain in right knee: Secondary | ICD-10-CM

## 2021-04-28 DIAGNOSIS — R600 Localized edema: Secondary | ICD-10-CM

## 2021-04-28 NOTE — Therapy (Signed)
[facility] [address] [place], [place], [address] Phone: [phone]   Fax:  [phone]  Physical Therapy Treatment  Patient Details  Name: Tonya [name] MRN: [id number] Date of Birth: 1946/07/07 Referring Provider (PT): Dwana Melena PA-C   Encounter Date: 04/28/2021   PT End of Session - 04/28/21 0938     Visit Number 5    Number of Visits 16    Date for PT Re-Evaluation 06/07/21    Authorization Type Aetna MCR    Progress Note Due on Visit 10    PT Start Time 0935    PT Stop Time 6644    PT Time Calculation (min) 48 min    Activity Tolerance Patient tolerated treatment well;No increased pain    Behavior During Therapy WFL for tasks assessed/performed             Past Medical History:  Diagnosis Date   Allergy    Dysrhythmia    Hyperlipidemia    Hypertension    [name] disease    per her report from skin biopsy prior   Osteoarthritis    knee, hips   Plantar fasciitis    right foot   Tobacco use disorder     Past Surgical History:  Procedure Laterality Date   ABDOMINAL HYSTERECTOMY     partial   CHOLECYSTECTOMY  2003   COLONOSCOPY  2010   TOTAL KNEE ARTHROPLASTY Right 03/08/2021   Procedure: RIGHT TOTAL KNEE ARTHROPLASTY;  Surgeon: Leandrew Koyanagi, MD;  Location: [place];  Service: Orthopedics;  Laterality: Right;   WISDOM TOOTH EXTRACTION      There were no vitals filed for this visit.   Subjective Assessment - 04/28/21 0942     Subjective Pt arriving stating she needs to take a break from therapy next week due to family coming into town to stay.    Pertinent History R TKR 03/08/21, R plantar fasciitis    Limitations Sitting;House hold activities;Lifting;Standing;Walking    How long can you sit comfortably? indeterminate amount of time    Patient Stated Goals to be able to walk without pain    Currently in Pain? No/denies                [facility] PT Assessment - 04/28/21 0001       Assessment    Medical Diagnosis R TKA    Referring Provider (PT) Dwana Melena PA-C    Onset Date/Surgical Date 03/08/21      AROM   AROM Assessment Site Knee    Right/Left Knee Right    Right Knee Extension -8    Right Knee Flexion 120      PROM   PROM Assessment Site Knee    Right/Left Knee Right    Right Knee Extension -5    Right Knee Flexion 122                           [facility] Adult PT Treatment/Exercise - 04/28/21 0001       Exercises   Exercises Knee/Hip      Knee/Hip Exercises: Aerobic   Recumbent Bike L4 x 8 minutes      Knee/Hip Exercises: Machines for Strengthening   Cybex Knee Extension Rt LE only 5# 2x10    Cybex Knee Flexion Rt LE only: 15# 2x10    Total Gym Leg Press bilateral LE's: 100# 3x10, R LE only: 50# 2x10      Knee/Hip Exercises: Seated  Long Arc [employer] Strengthening;Right;2 sets;10 reps    Long Arc [employer] 4 lbs.      Knee/Hip Exercises: Prone   Prone Knee Hang Weights (lbs) 5 minutes with 5#      Modalities   Modalities Vasopneumatic      Vasopneumatic   Number Minutes Vasopneumatic  10 minutes    Vasopnuematic Location  Knee    Vasopneumatic Pressure Medium    Vasopneumatic Temperature  34                      PT Short Term Goals - 04/28/21 0950       PT SHORT TERM GOAL #1   Title Pt will be indep with HEP    Status Achieved      PT SHORT TERM GOAL #2   Title Pt will be able to stand x 20 min with 50% less difficulty    Status Partially Met      PT SHORT TERM GOAL #3   Title Pt will report improved R knee pain to </=4/10 pain with functional mobility    Baseline pt reporting pain </= 4/10 with ADL's over the last week.    Status Achieved               PT Long Term Goals - 04/26/21 1203       PT LONG TERM GOAL #1   Title Pt will demo improved R knee ext at rest </=-10 degrees to assist with more normalized gait    Status On-going      PT LONG TERM GOAL #2   Title Pt will demo improved R knee  flexion to >/= 115 degrees to assist with more normalized transfers    Status Achieved      PT LONG TERM GOAL #3   Title Pt will demo improved R LE strength >/=1/2 MMT grade to assist with improved functional mobility    Status On-going      PT LONG TERM GOAL #4   Title Pt will be able to ambulate with LRAD/no AD with improved heel to toe gait x 30 min to shop at the grocery store    Status On-going                   Plan - 04/28/21 0945     Clinical Impression Statement Pt repoting no pain upon arrival. Pt requesting to take next week off from therapy due to family coming into town. Pt encourged to continue her HEP. Still focusing on extension exercises. Pt's active flexion to 120 degrees. Continue skilled PT to maximize function.    Personal Factors and Comorbidities Comorbidity 1    Comorbidities PVCs, afib, R plantar fasciitis    Examination-Activity Limitations Bathing;Hygiene/Grooming;Stairs;Locomotion Level;Stand;Transfers    Examination-Participation Restrictions Shop;Community Activity    Stability/Clinical Decision Making Stable/Uncomplicated    Rehab Potential Good    PT Frequency 2x / week    PT Duration 8 weeks    PT Treatment/Interventions ADLs/Self Care Home Management;Cryotherapy;Electrical Stimulation;DME Instruction;Gait training;Stair training;Balance training;Therapeutic exercise;Therapeutic activities;Functional mobility training;Neuromuscular re-education;Patient/family education;Manual techniques;Scar mobilization;Passive range of motion;Dry needling;Vasopneumatic Device;Taping    PT Next Visit Plan ROM with extension focus and strengthening, gait training and balance    PT Home Exercise Plan Access Code: [email or web address]    Consulted and Agree with Plan of Care Patient             Patient will benefit from skilled therapeutic intervention in order to improve the  following deficits and impairments:  Abnormal gait, Decreased balance, Decreased endurance,  Decreased mobility, Difficulty walking, Decreased scar mobility, Decreased range of motion, Decreased activity tolerance, Decreased strength, Impaired flexibility, Pain  Visit Diagnosis: Difficulty in walking, not elsewhere classified  Muscle weakness (generalized)  Stiffness of right knee, not elsewhere classified  Chronic pain of right knee  Localized edema  Acute pain of right knee  Other abnormalities of gait and mobility  Unsteadiness on feet     Problem List Patient Active Problem List   Diagnosis Date Noted   Primary osteoarthritis of right knee 03/08/2021   Status post total right knee replacement 03/08/2021   Lactic acidosis 01/13/2020   Elevated troponin 01/13/2020   Demand ischemia ([place]) 01/13/2020   AKI (acute kidney injury) ([place]) 01/13/2020   Atrial fibrillation ([place]) 01/12/2020   Physical exam 07/02/2015   Eustachian tube dysfunction 05/29/2015   Hyperlipidemia 06/04/2013   PVCs (premature ventricular contractions) 06/04/2013   Snoring 11/08/2011   Sleep disturbance 11/08/2011   Need for prophylactic vaccination and inoculation against influenza 11/08/2011   Essential hypertension, benign 07/13/2011   Tobacco use disorder 07/13/2011    Oretha Caprice, PT, MPT 04/28/2021, 10:11 AM  [facility] Physical Therapy [address] [place], [place], [address] Phone: [phone]   Fax:  [phone]  Name: Tonya [name] MRN: [id number] Date of Birth: 1946/05/08

## 2021-05-03 ENCOUNTER — Encounter: Payer: Medicare HMO | Admitting: Physical Therapy

## 2021-05-05 ENCOUNTER — Encounter: Payer: Medicare HMO | Admitting: Physical Therapy

## 2021-05-11 ENCOUNTER — Ambulatory Visit: Payer: Medicare HMO | Admitting: Physical Therapy

## 2021-05-11 ENCOUNTER — Telehealth: Payer: Self-pay | Admitting: Orthopaedic Surgery

## 2021-05-11 ENCOUNTER — Other Ambulatory Visit: Payer: Self-pay

## 2021-05-11 ENCOUNTER — Encounter: Payer: Self-pay | Admitting: Physical Therapy

## 2021-05-11 DIAGNOSIS — R2681 Unsteadiness on feet: Secondary | ICD-10-CM

## 2021-05-11 DIAGNOSIS — M25661 Stiffness of right knee, not elsewhere classified: Secondary | ICD-10-CM

## 2021-05-11 DIAGNOSIS — R262 Difficulty in walking, not elsewhere classified: Secondary | ICD-10-CM | POA: Diagnosis not present

## 2021-05-11 DIAGNOSIS — M6281 Muscle weakness (generalized): Secondary | ICD-10-CM

## 2021-05-11 DIAGNOSIS — R2689 Other abnormalities of gait and mobility: Secondary | ICD-10-CM

## 2021-05-11 DIAGNOSIS — R600 Localized edema: Secondary | ICD-10-CM

## 2021-05-11 DIAGNOSIS — M25561 Pain in right knee: Secondary | ICD-10-CM | POA: Diagnosis not present

## 2021-05-11 DIAGNOSIS — G8929 Other chronic pain: Secondary | ICD-10-CM

## 2021-05-11 NOTE — Telephone Encounter (Signed)
Pt came up here and was asking for a note to to extend her daughters leave. She needs to extend it for physical therapy. [place] states she will fax it to the post office.   CB [phone]

## 2021-05-11 NOTE — Therapy (Signed)
[facility] [address] [place], [place], [address] Phone: [phone]   Fax:  [phone]  Physical Therapy Treatment  Patient Details  Name: Tonya [name] MRN: [id number] Date of Birth: 08/08/1946 Referring Provider (PT): Dwana Melena PA-C   Encounter Date: 05/11/2021   PT End of Session - 05/11/21 1350     Visit Number 6    Number of Visits 16    Date for PT Re-Evaluation 06/07/21    Authorization Type Aetna MCR    Progress Note Due on Visit 10    PT Start Time [id number]    PT Stop Time 1430    PT Time Calculation (min) 42 min    Activity Tolerance Patient tolerated treatment well;No increased pain    Behavior During Therapy WFL for tasks assessed/performed             Past Medical History:  Diagnosis Date   Allergy    Dysrhythmia    Hyperlipidemia    Hypertension    [name] disease    per her report from skin biopsy prior   Osteoarthritis    knee, hips   Plantar fasciitis    right foot   Tobacco use disorder     Past Surgical History:  Procedure Laterality Date   ABDOMINAL HYSTERECTOMY     partial   CHOLECYSTECTOMY  2003   COLONOSCOPY  2010   TOTAL KNEE ARTHROPLASTY Right 03/08/2021   Procedure: RIGHT TOTAL KNEE ARTHROPLASTY;  Surgeon: Leandrew Koyanagi, MD;  Location: [place];  Service: Orthopedics;  Laterality: Right;   WISDOM TOOTH EXTRACTION      There were no vitals filed for this visit.   Subjective Assessment - 05/11/21 1356     Subjective Pt arriving with her daughter today. Pt's daughter stating she is going back to work on 05/24/2021 and will not be able to transport her mother to therapy.    Patient is accompained by: Family member    Pertinent History R TKR 03/08/21, R plantar fasciitis    Limitations Sitting;House hold activities;Lifting;Standing;Walking    How long can you sit comfortably? indeterminate amount of time    Patient Stated Goals to be able to walk without pain    Currently in Pain?  No/denies    Pain Score 0-No pain                [facility] PT Assessment - 05/11/21 0001       Assessment   Medical Diagnosis R TKA    Referring Provider (PT) Dwana Melena PA-C    Onset Date/Surgical Date 03/08/21      AROM   AROM Assessment Site Knee    Right/Left Knee Right    Right Knee Extension -6    Right Knee Flexion 122      PROM   PROM Assessment Site Knee    Right/Left Knee Right    Right Knee Extension -5    Right Knee Flexion 125                           [facility] Adult PT Treatment/Exercise - 05/11/21 0001       Exercises   Exercises Knee/Hip      Knee/Hip Exercises: Stretches   Active Hamstring Stretch Right;3 reps;20 seconds      Knee/Hip Exercises: Machines for Strengthening   Cybex Knee Extension Rt LE only 5# 2x10    Cybex Knee Flexion Rt LE only: 15# 2x10  Total Gym Leg Press bilateral LE's: 100# 3x10, R LE only: 50# 2x10      Knee/Hip Exercises: Supine   Straight Leg Raises Right;Strengthening;2 sets;10 reps      Manual Therapy   Manual therapy comments extension with over pressure                      PT Short Term Goals - 05/11/21 1422       PT SHORT TERM GOAL #1   Title Pt will be indep with HEP    Status Achieved      PT SHORT TERM GOAL #2   Title Pt will be able to stand x 20 min with 50% less difficulty    Status Achieved      PT SHORT TERM GOAL #3   Title Pt will report improved R knee pain to </=4/10 pain with functional mobility    Status Achieved               PT Long Term Goals - 04/26/21 1203       PT LONG TERM GOAL #1   Title Pt will demo improved R knee ext at rest </=-10 degrees to assist with more normalized gait    Status On-going      PT LONG TERM GOAL #2   Title Pt will demo improved R knee flexion to >/= 115 degrees to assist with more normalized transfers    Status Achieved      PT LONG TERM GOAL #3   Title Pt will demo improved R LE strength >/=1/2 MMT grade to  assist with improved functional mobility    Status On-going      PT LONG TERM GOAL #4   Title Pt will be able to ambulate with LRAD/no AD with improved heel to toe gait x 30 min to shop at the grocery store    Status On-going                   Plan - 05/11/21 1419     Clinical Impression Statement Pt arriving to therapy after a week off from therapy. Pt with AROM 6-122 degrees and PROM 5-125 degrees. Pt with Baker's Cyst in right knee. Physical therapy to continue to focus on overall functional mobility and strengthening.    Personal Factors and Comorbidities Comorbidity 1    Comorbidities PVCs, afib, R plantar fasciitis    Examination-Activity Limitations Bathing;Hygiene/Grooming;Stairs;Locomotion Level;Stand;Transfers    Examination-Participation Restrictions Shop;Community Activity    Stability/Clinical Decision Making Stable/Uncomplicated    Rehab Potential Good    PT Frequency 2x / week    PT Duration 8 weeks    PT Treatment/Interventions ADLs/Self Care Home Management;Cryotherapy;Electrical Stimulation;DME Instruction;Gait training;Stair training;Balance training;Therapeutic exercise;Therapeutic activities;Functional mobility training;Neuromuscular re-education;Patient/family education;Manual techniques;Scar mobilization;Passive range of motion;Dry needling;Vasopneumatic Device;Taping    PT Next Visit Plan ROM with extension focus and strengthening, gait training and balance, strengthening    PT Home Exercise Plan Access Code: [email or web address]    Consulted and Agree with Plan of Care Patient    Family Member Consulted daughter             Patient will benefit from skilled therapeutic intervention in order to improve the following deficits and impairments:  Abnormal gait, Decreased balance, Decreased endurance, Decreased mobility, Difficulty walking, Decreased scar mobility, Decreased range of motion, Decreased activity tolerance, Decreased strength, Impaired flexibility,  Pain  Visit Diagnosis: Difficulty in walking, not elsewhere classified  Muscle weakness (generalized)  Stiffness of  right knee, not elsewhere classified  Chronic pain of right knee  Localized edema  Acute pain of right knee  Other abnormalities of gait and mobility  Unsteadiness on feet     Problem List Patient Active Problem List   Diagnosis Date Noted   Primary osteoarthritis of right knee 03/08/2021   Status post total right knee replacement 03/08/2021   Lactic acidosis 01/13/2020   Elevated troponin 01/13/2020   Demand ischemia ([place]) 01/13/2020   AKI (acute kidney injury) ([place]) 01/13/2020   Atrial fibrillation ([place]) 01/12/2020   Physical exam 07/02/2015   Eustachian tube dysfunction 05/29/2015   Hyperlipidemia 06/04/2013   PVCs (premature ventricular contractions) 06/04/2013   Snoring 11/08/2011   Sleep disturbance 11/08/2011   Need for prophylactic vaccination and inoculation against influenza 11/08/2011   Essential hypertension, benign 07/13/2011   Tobacco use disorder 07/13/2011    Oretha Caprice, PT, MPT 05/11/2021, 2:25 PM  [facility] Therapy [address] [place], [place], [address] Phone: [phone]   Fax:  [phone]  Name: Tonya [name] MRN: [id number] Date of Birth: 02/04/1946

## 2021-05-12 NOTE — Telephone Encounter (Signed)
Sent mychart msg.

## 2021-05-12 NOTE — Telephone Encounter (Signed)
How long does she need it to be extended?

## 2021-05-12 NOTE — Telephone Encounter (Signed)
Not sure if this is appropriate as surgery was on 6/6.  What do you think, xu?

## 2021-05-14 NOTE — Telephone Encounter (Signed)
Note written and let patient know

## 2021-05-14 NOTE — Telephone Encounter (Signed)
yes

## 2021-05-14 NOTE — Telephone Encounter (Signed)
She will ask daughter and call us back.

## 2021-05-17 ENCOUNTER — Other Ambulatory Visit: Payer: Self-pay

## 2021-05-17 ENCOUNTER — Ambulatory Visit: Payer: Medicare HMO | Admitting: Physical Therapy

## 2021-05-17 DIAGNOSIS — M25561 Pain in right knee: Secondary | ICD-10-CM

## 2021-05-17 DIAGNOSIS — R262 Difficulty in walking, not elsewhere classified: Secondary | ICD-10-CM | POA: Diagnosis not present

## 2021-05-17 DIAGNOSIS — G8929 Other chronic pain: Secondary | ICD-10-CM

## 2021-05-17 DIAGNOSIS — M6281 Muscle weakness (generalized): Secondary | ICD-10-CM | POA: Diagnosis not present

## 2021-05-17 DIAGNOSIS — M25661 Stiffness of right knee, not elsewhere classified: Secondary | ICD-10-CM | POA: Diagnosis not present

## 2021-05-17 DIAGNOSIS — R600 Localized edema: Secondary | ICD-10-CM

## 2021-05-17 NOTE — Therapy (Signed)
[facility] Physical Therapy [address] [place], [place], [address] Phone: [phone]   Fax:  [phone]  Physical Therapy Treatment  Patient Details  Name: Tonya [name] MRN: [id number] Date of Birth: December 07, 1945 Referring Provider (PT): Dwana Melena PA-C   Encounter Date: 05/17/2021   PT End of Session - 05/17/21 1501     Visit Number 7    Number of Visits 16    Date for PT Re-Evaluation 06/07/21    Authorization Type Aetna MCR    Progress Note Due on Visit 10    PT Start Time 1430    PT Stop Time 1508    PT Time Calculation (min) 38 min    Activity Tolerance Patient tolerated treatment well;No increased pain    Behavior During Therapy WFL for tasks assessed/performed             Past Medical History:  Diagnosis Date   Allergy    Dysrhythmia    Hyperlipidemia    Hypertension    [name] disease    per her report from skin biopsy prior   Osteoarthritis    knee, hips   Plantar fasciitis    right foot   Tobacco use disorder     Past Surgical History:  Procedure Laterality Date   ABDOMINAL HYSTERECTOMY     partial   CHOLECYSTECTOMY  2003   COLONOSCOPY  2010   TOTAL KNEE ARTHROPLASTY Right 03/08/2021   Procedure: RIGHT TOTAL KNEE ARTHROPLASTY;  Surgeon: Leandrew Koyanagi, MD;  Location: [place];  Service: Orthopedics;  Laterality: Right;   WISDOM TOOTH EXTRACTION      There were no vitals filed for this visit.   Subjective Assessment - 05/17/21 1439     Subjective She denies knee pain upon arrival today    Patient is accompained by: Family member    Pertinent History R TKR 03/08/21, R plantar fasciitis    Limitations Sitting;House hold activities;Lifting;Standing;Walking    How long can you sit comfortably? indeterminate amount of time    How long can you stand comfortably? 10 min    How long can you walk comfortably? 15 min    Patient Stated Goals to be able to walk without pain    Pain Onset More than a month ago               [facility] Adult PT Treatment/Exercise - 05/17/21 0001       Knee/Hip Exercises: Stretches   Gastroc Stretch Both;3 reps;30 seconds    Other Knee/Hip Stretches supine TKE stretch 5 min with 5#      Knee/Hip Exercises: Aerobic   Recumbent Bike L3 X 6 min      Knee/Hip Exercises: Machines for Strengthening   Cybex Knee Extension Rt LE only 5# 3x10    Cybex Knee Flexion Rt LE only: 15# 3x10      Knee/Hip Exercises: Standing   Lateral Step Up Right;15 reps;Hand Hold: 2;Step Height: 6"    Forward Step Up Right;15 reps;Hand Hold: 2;Step Height: 6"    Other Standing Knee Exercises TRX squats 2X10      Knee/Hip Exercises: Supine   Straight Leg Raises Right;Strengthening;2 sets;10 reps      Manual Therapy   Manual therapy comments Rt knee PROM, extension with over pressure and extension mobs and manual H.S. stretching                      PT Short Term Goals - 05/11/21 1422  PT SHORT TERM GOAL #1   Title Pt will be indep with HEP    Status Achieved      PT SHORT TERM GOAL #2   Title Pt will be able to stand x 20 min with 50% less difficulty    Status Achieved      PT SHORT TERM GOAL #3   Title Pt will report improved R knee pain to </=4/10 pain with functional mobility    Status Achieved               PT Long Term Goals - 04/26/21 1203       PT LONG TERM GOAL #1   Title Pt will demo improved R knee ext at rest </=-10 degrees to assist with more normalized gait    Status On-going      PT LONG TERM GOAL #2   Title Pt will demo improved R knee flexion to >/= 115 degrees to assist with more normalized transfers    Status Achieved      PT LONG TERM GOAL #3   Title Pt will demo improved R LE strength >/=1/2 MMT grade to assist with improved functional mobility    Status On-going      PT LONG TERM GOAL #4   Title Pt will be able to ambulate with LRAD/no AD with improved heel to toe gait x 30 min to shop at the grocery store    Status On-going                    Plan - 05/17/21 1501     Clinical Impression Statement Continued to progress her overall knee strength and extension ROM. She is doing excellent with flexion ROM. We will continue to focus on strength and extension ROM as able    Personal Factors and Comorbidities Comorbidity 1    Comorbidities PVCs, afib, R plantar fasciitis    Examination-Activity Limitations Bathing;Hygiene/Grooming;Stairs;Locomotion Level;Stand;Transfers    Examination-Participation Restrictions Shop;Community Activity    Stability/Clinical Decision Making Stable/Uncomplicated    Rehab Potential Good    PT Frequency 2x / week    PT Duration 8 weeks    PT Treatment/Interventions ADLs/Self Care Home Management;Cryotherapy;Electrical Stimulation;DME Instruction;Gait training;Stair training;Balance training;Therapeutic exercise;Therapeutic activities;Functional mobility training;Neuromuscular re-education;Patient/family education;Manual techniques;Scar mobilization;Passive range of motion;Dry needling;Vasopneumatic Device;Taping    PT Next Visit Plan ROM with extension focus and strengthening, gait training and balance, strengthening    PT Home Exercise Plan Access Code: [email or web address]    Consulted and Agree with Plan of Care Patient    Family Member Consulted daughter             Patient will benefit from skilled therapeutic intervention in order to improve the following deficits and impairments:  Abnormal gait, Decreased balance, Decreased endurance, Decreased mobility, Difficulty walking, Decreased scar mobility, Decreased range of motion, Decreased activity tolerance, Decreased strength, Impaired flexibility, Pain  Visit Diagnosis: Difficulty in walking, not elsewhere classified  Muscle weakness (generalized)  Stiffness of right knee, not elsewhere classified  Chronic pain of right knee  Localized edema     Problem List Patient Active Problem List   Diagnosis Date Noted   Primary  osteoarthritis of right knee 03/08/2021   Status post total right knee replacement 03/08/2021   Lactic acidosis 01/13/2020   Elevated troponin 01/13/2020   Demand ischemia ([place]) 01/13/2020   AKI (acute kidney injury) ([place]) 01/13/2020   Atrial fibrillation ([place]) 01/12/2020   Physical exam 07/02/2015   Eustachian tube dysfunction 05/29/2015  Hyperlipidemia 06/04/2013   PVCs (premature ventricular contractions) 06/04/2013   Snoring 11/08/2011   Sleep disturbance 11/08/2011   Need for prophylactic vaccination and inoculation against influenza 11/08/2011   Essential hypertension, benign 07/13/2011   Tobacco use disorder 07/13/2011    Tonya [name] 05/17/2021, 3:02 PM  [facility] Physical Therapy [address] [place], [place], [address] Phone: [phone]   Fax:  [phone]  Name: Tonya [name] MRN: [id number] Date of Birth: 1946-08-03

## 2021-05-18 ENCOUNTER — Encounter: Payer: Medicare HMO | Admitting: Physical Therapy

## 2021-05-21 ENCOUNTER — Telehealth: Payer: Self-pay | Admitting: Orthopaedic Surgery

## 2021-05-21 NOTE — Telephone Encounter (Signed)
Spoke with patient. Faxed last note stating that she will be out until 06/14/21 to fax [phone] as patient requested.

## 2021-05-21 NOTE — Telephone Encounter (Signed)
Patient's daughter called. Wants to make sure that the forms has her name and not the patient's name on the FMLA forms. Her call back number is [phone]

## 2021-05-21 NOTE — Telephone Encounter (Signed)
Pt's daughter Ishmael Holter  called need a call back right away. Please call pt as soon as possible concerning her FMLA paperwork due date 05/24/21. Phone is [phone].

## 2021-05-24 ENCOUNTER — Telehealth: Payer: Self-pay | Admitting: Orthopaedic Surgery

## 2021-05-24 NOTE — Telephone Encounter (Signed)
Pts daughter [place] called stating her FMLA paperwork she had filled out was missing some info and she wanted to have her letter rewritten and faxed.   EIN# [id number] FMLA Case # [id number]  [place] would like a CB when this has been fixed with both numbers and wants to make sure her name is listed on the [facility] paperwork.   Fax# [phone]

## 2021-05-24 NOTE — Telephone Encounter (Signed)
Pt called requesting a handicap placard. Pt is asking for a phone call when placard. Pt phone number is [phone].

## 2021-05-25 ENCOUNTER — Encounter: Payer: Self-pay | Admitting: Physical Therapy

## 2021-05-25 ENCOUNTER — Ambulatory Visit: Payer: Medicare HMO | Admitting: Physical Therapy

## 2021-05-25 ENCOUNTER — Other Ambulatory Visit: Payer: Self-pay

## 2021-05-25 DIAGNOSIS — M6281 Muscle weakness (generalized): Secondary | ICD-10-CM | POA: Diagnosis not present

## 2021-05-25 DIAGNOSIS — R600 Localized edema: Secondary | ICD-10-CM | POA: Diagnosis not present

## 2021-05-25 DIAGNOSIS — M25661 Stiffness of right knee, not elsewhere classified: Secondary | ICD-10-CM

## 2021-05-25 DIAGNOSIS — M25561 Pain in right knee: Secondary | ICD-10-CM | POA: Diagnosis not present

## 2021-05-25 NOTE — Telephone Encounter (Signed)
Patient aware.

## 2021-05-25 NOTE — Therapy (Signed)
[facility] Physical Therapy [address] [place], [place], [address] Phone: [phone]   Fax:  [phone]  Physical Therapy Treatment  Patient Details  Name: Tonya [name] MRN: [id number] Date of Birth: 1946-02-04 Referring Provider (PT): Dwana Melena PA-C   Encounter Date: 05/25/2021   PT End of Session - 05/25/21 1142     Visit Number 8    Number of Visits 16    Date for PT Re-Evaluation 06/07/21    Authorization Type [employer] MCR    Progress Note Due on Visit 10    PT Start Time [id number]    PT Stop Time 1226    PT Time Calculation (min) 41 min    Activity Tolerance Patient tolerated treatment well;No increased pain    Behavior During Therapy WFL for tasks assessed/performed             Past Medical History:  Diagnosis Date   Allergy    Dysrhythmia    Hyperlipidemia    Hypertension    [name] disease    per her report from skin biopsy prior   Osteoarthritis    knee, hips   Plantar fasciitis    right foot   Tobacco use disorder     Past Surgical History:  Procedure Laterality Date   ABDOMINAL HYSTERECTOMY     partial   CHOLECYSTECTOMY  2003   COLONOSCOPY  2010   TOTAL KNEE ARTHROPLASTY Right 03/08/2021   Procedure: RIGHT TOTAL KNEE ARTHROPLASTY;  Surgeon: Leandrew Koyanagi, MD;  Location: [place];  Service: Orthopedics;  Laterality: Right;   WISDOM TOOTH EXTRACTION      There were no vitals filed for this visit.   Subjective Assessment - 05/25/21 1144     Subjective She did not do exercises because she was cleaning her house.  She had to take more breaks than prior to knee replacement. Her knee seems tighter.    Patient is accompained by: Family member    Pertinent History R TKR 03/08/21, R plantar fasciitis    Limitations Sitting;House hold activities;Lifting;Standing;Walking    How long can you sit comfortably? indeterminate amount of time    How long can you stand comfortably? 10 min    How long can you walk comfortably? 15  min    Patient Stated Goals to be able to walk without pain    Currently in Pain? Yes    Pain Score 4     Pain Location Knee    Pain Orientation Right;Anterior    Pain Descriptors / Indicators Tightness    Pain Type Acute pain    Pain Onset More than a month ago    Pain Frequency Intermittent    Aggravating Factors  leaving knee in one position too long    Pain Relieving Factors moving knee                               [facility] Adult PT Treatment/Exercise - 05/25/21 1145       Knee/Hip Exercises: Stretches   Gastroc Stretch Both;3 reps;30 seconds    Other Knee/Hip Stretches supine TKE stretch 5 min with 5#      Knee/Hip Exercises: Aerobic   Recumbent Bike L3 X 6 min seat 5      Knee/Hip Exercises: Machines for Strengthening   Cybex Knee Extension Rt LE only 5# 3x10    Cybex Knee Flexion Rt LE only: 15# 3x10      Knee/Hip  Exercises: Standing   Lateral Step Up Both;15 reps;Hand Hold: 2;Step Height: 6"    Lateral Step Up Limitations demo &verbal cues on technique    Forward Step Up Both;15 reps;Hand Hold: 2;Step Height: 6"    Forward Step Up Limitations demo &verbal cues on technique    Other Standing Knee Exercises TRX squats 2X10      Knee/Hip Exercises: Supine   Straight Leg Raises Right;Strengthening;2 sets;10 reps      Manual Therapy   Manual therapy comments Rt knee PROM, extension with over pressure and extension mobs and manual H.S. stretching                      PT Short Term Goals - 05/11/21 1422       PT SHORT TERM GOAL #1   Title Pt will be indep with HEP    Status Achieved      PT SHORT TERM GOAL #2   Title Pt will be able to stand x 20 min with 50% less difficulty    Status Achieved      PT SHORT TERM GOAL #3   Title Pt will report improved R knee pain to </=4/10 pain with functional mobility    Status Achieved               PT Long Term Goals - 04/26/21 1203       PT LONG TERM GOAL #1   Title Pt will demo  improved R knee ext at rest </=-10 degrees to assist with more normalized gait    Status On-going      PT LONG TERM GOAL #2   Title Pt will demo improved R knee flexion to >/= 115 degrees to assist with more normalized transfers    Status Achieved      PT LONG TERM GOAL #3   Title Pt will demo improved R LE strength >/=1/2 MMT grade to assist with improved functional mobility    Status On-going      PT LONG TERM GOAL #4   Title Pt will be able to ambulate with LRAD/no AD with improved heel to toe gait x 30 min to shop at the grocery store    Status On-going                   Plan - 05/25/21 1143     Clinical Impression Statement PT instructed patient how compliance with HEP will help her improve which she verbalized understanding.  She improved step up & down with instruction in technique which should carryover to curbs & stairs.    Personal Factors and Comorbidities Comorbidity 1    Comorbidities PVCs, afib, R plantar fasciitis    Examination-Activity Limitations Bathing;Hygiene/Grooming;Stairs;Locomotion Level;Stand;Transfers    Examination-Participation Restrictions Shop;Community Activity    Stability/Clinical Decision Making Stable/Uncomplicated    Rehab Potential Good    PT Frequency 2x / week    PT Duration 8 weeks    PT Treatment/Interventions ADLs/Self Care Home Management;Cryotherapy;Electrical Stimulation;DME Instruction;Gait training;Stair training;Balance training;Therapeutic exercise;Therapeutic activities;Functional mobility training;Neuromuscular re-education;Patient/family education;Manual techniques;Scar mobilization;Passive range of motion;Dry needling;Vasopneumatic Device;Taping    PT Next Visit Plan continue to focus on ROM with extension focus and strengthening, gait training and balance, strengthening    PT Home Exercise Plan Access Code: [email or web address]    Consulted and Agree with Plan of Care Patient    Family Member Consulted daughter              Patient will benefit  from skilled therapeutic intervention in order to improve the following deficits and impairments:  Abnormal gait, Decreased balance, Decreased endurance, Decreased mobility, Difficulty walking, Decreased scar mobility, Decreased range of motion, Decreased activity tolerance, Decreased strength, Impaired flexibility, Pain  Visit Diagnosis: Muscle weakness (generalized)  Stiffness of right knee, not elsewhere classified  Localized edema  Acute pain of right knee     Problem List Patient Active Problem List   Diagnosis Date Noted   Primary osteoarthritis of right knee 03/08/2021   Status post total right knee replacement 03/08/2021   Lactic acidosis 01/13/2020   Elevated troponin 01/13/2020   Demand ischemia ([place]) 01/13/2020   AKI (acute kidney injury) ([place]) 01/13/2020   Atrial fibrillation ([place]) 01/12/2020   Physical exam 07/02/2015   Eustachian tube dysfunction 05/29/2015   Hyperlipidemia 06/04/2013   PVCs (premature ventricular contractions) 06/04/2013   Snoring 11/08/2011   Sleep disturbance 11/08/2011   Need for prophylactic vaccination and inoculation against influenza 11/08/2011   Essential hypertension, benign 07/13/2011   Tobacco use disorder 07/13/2011    Jamey Reas, PT, DPT 05/25/2021, 12:54 PM  [facility] Physical Therapy [address] [place], [place], [address] Phone: [phone]   Fax:  [phone]  Name: Tonya [name] MRN: [id number] Date of Birth: Jan 11, 1946

## 2021-05-25 NOTE — Telephone Encounter (Signed)
Handicap form approved and ready for pick up

## 2021-06-01 ENCOUNTER — Other Ambulatory Visit: Payer: Self-pay

## 2021-06-01 ENCOUNTER — Encounter: Payer: Self-pay | Admitting: Physical Therapy

## 2021-06-01 ENCOUNTER — Ambulatory Visit: Payer: Medicare HMO | Admitting: Physical Therapy

## 2021-06-01 DIAGNOSIS — M25661 Stiffness of right knee, not elsewhere classified: Secondary | ICD-10-CM | POA: Diagnosis not present

## 2021-06-01 DIAGNOSIS — R2689 Other abnormalities of gait and mobility: Secondary | ICD-10-CM

## 2021-06-01 DIAGNOSIS — R262 Difficulty in walking, not elsewhere classified: Secondary | ICD-10-CM

## 2021-06-01 DIAGNOSIS — R600 Localized edema: Secondary | ICD-10-CM | POA: Diagnosis not present

## 2021-06-01 DIAGNOSIS — R2681 Unsteadiness on feet: Secondary | ICD-10-CM

## 2021-06-01 DIAGNOSIS — G8929 Other chronic pain: Secondary | ICD-10-CM

## 2021-06-01 DIAGNOSIS — M6281 Muscle weakness (generalized): Secondary | ICD-10-CM

## 2021-06-01 DIAGNOSIS — M25561 Pain in right knee: Secondary | ICD-10-CM | POA: Diagnosis not present

## 2021-06-02 NOTE — Therapy (Signed)
[facility] Physical Therapy [address] [place], [place], [address] Phone: [phone]   Fax:  [phone]  Physical Therapy Treatment  Patient Details  Name: Tonya [name] MRN: [id number] Date of Birth: 02-28-1946 Referring Provider (PT): Dwana Melena PA-C   Encounter Date: 06/01/2021   PT End of Session - 06/01/21 1354     Visit Number 9    Number of Visits 16    Date for PT Re-Evaluation 06/07/21    Authorization Type Aetna MCR    Progress Note Due on Visit 10    PT Start Time [id number]    PT Stop Time 1438    PT Time Calculation (min) 53 min    Activity Tolerance Patient tolerated treatment well;No increased pain    Behavior During Therapy WFL for tasks assessed/performed             Past Medical History:  Diagnosis Date   Allergy    Dysrhythmia    Hyperlipidemia    Hypertension    [name] disease    per her report from skin biopsy prior   Osteoarthritis    knee, hips   Plantar fasciitis    right foot   Tobacco use disorder     Past Surgical History:  Procedure Laterality Date   ABDOMINAL HYSTERECTOMY     partial   CHOLECYSTECTOMY  2003   COLONOSCOPY  2010   TOTAL KNEE ARTHROPLASTY Right 03/08/2021   Procedure: RIGHT TOTAL KNEE ARTHROPLASTY;  Surgeon: Leandrew Koyanagi, MD;  Location: [place];  Service: Orthopedics;  Laterality: Right;   WISDOM TOOTH EXTRACTION      There were no vitals filed for this visit.   Subjective Assessment - 06/01/21 1345     Subjective she questioned need antibiodic prior to dental cleaning.  (PT recommended consulting her cardiologist with this question) She has been doing her exercises.    Patient is accompained by: Family member    Pertinent History R TKR 03/08/21, R plantar fasciitis    Limitations Sitting;House hold activities;Lifting;Standing;Walking    How long can you sit comfortably? indeterminate amount of time    How long can you stand comfortably? 10 min    How long can you walk  comfortably? 15 min    Patient Stated Goals to be able to walk without pain    Currently in Pain? No/denies   stiffness in right knee   Pain Onset More than a month ago                [facility] PT Assessment - 06/01/21 1345       Assessment   Medical Diagnosis R TKA    Referring Provider (PT) Dwana Melena PA-C    Onset Date/Surgical Date 03/08/21      AROM   AROM Assessment Site Knee    Right/Left Knee Right    Right Knee Extension -5    Right Knee Flexion 123                           [facility] Adult PT Treatment/Exercise - 06/01/21 1345       Knee/Hip Exercises: Stretches   Gastroc Stretch Both;30 seconds;2 reps    Gastroc Stretch Limitations standing on step with heel depression    Other Knee/Hip Stretches --      Knee/Hip Exercises: Aerobic   Recumbent Bike L3 X 8 min seat 8 on [employer] with      Knee/Hip Exercises: Machines for Strengthening  Cybex Knee Extension --    Cybex Knee Flexion --    Total Gym Leg Press bilateral LE's: 106# 15 reps 2 sets, R LE only: 56# 15 reps 2 sets      Knee/Hip Exercises: Standing   Terminal Knee Extension Strengthening;Right;3 sets;15 reps;Theraband    Theraband Level (Terminal Knee Extension) Level 4 (Blue)    Terminal Knee Extension Limitations 1st set bilateral stance, 2nd set initial contact & 3rd set terminal stance.    Lateral Step Up Both;5 reps;Hand Hold: 2;Step Height: 6"    Lateral Step Up Limitations demo &verbal cues on technique    Forward Step Up Both;5 reps;Hand Hold: 2;Step Height: 6"    Forward Step Up Limitations demo &verbal cues on technique    Other Standing Knee Exercises TRX squats 2X10      Knee/Hip Exercises: Supine   Straight Leg Raises Right;Strengthening;2 sets;10 reps      Knee/Hip Exercises: Prone   Hamstring Curl 2 sets;10 reps    Hamstring Curl Limitations 5#    Prone Knee Hang 3 minutes    Prone Knee Hang Weights (lbs) 5      Vasopneumatic   Number Minutes Vasopneumatic  10  minutes    Vasopnuematic Location  Knee    Vasopneumatic Pressure Medium    Vasopneumatic Temperature  34      Manual Therapy   Manual therapy comments Rt knee PROM, extension with over pressure and extension mobs and manual H.S. stretching                      PT Short Term Goals - 05/11/21 1422       PT SHORT TERM GOAL #1   Title Pt will be indep with HEP    Status Achieved      PT SHORT TERM GOAL #2   Title Pt will be able to stand x 20 min with 50% less difficulty    Status Achieved      PT SHORT TERM GOAL #3   Title Pt will report improved R knee pain to </=4/10 pain with functional mobility    Status Achieved               PT Long Term Goals - 04/26/21 1203       PT LONG TERM GOAL #1   Title Pt will demo improved R knee ext at rest </=-10 degrees to assist with more normalized gait    Status On-going      PT LONG TERM GOAL #2   Title Pt will demo improved R knee flexion to >/= 115 degrees to assist with more normalized transfers    Status Achieved      PT LONG TERM GOAL #3   Title Pt will demo improved R LE strength >/=1/2 MMT grade to assist with improved functional mobility    Status On-going      PT LONG TERM GOAL #4   Title Pt will be able to ambulate with LRAD/no AD with improved heel to toe gait x 30 min to shop at the grocery store    Status On-going                   Plan - 06/01/21 1354     Clinical Impression Statement Patient's AROM of right knee continues to slightly improve.  PT worked on [employer] & range which she improved.    Personal Factors and Comorbidities Comorbidity 1    Comorbidities PVCs,  afib, R plantar fasciitis    Examination-Activity Limitations Bathing;Hygiene/Grooming;Stairs;Locomotion Level;Stand;Transfers    Examination-Participation Restrictions Shop;Community Activity    Stability/Clinical Decision Making Stable/Uncomplicated    Rehab Potential Good    PT Frequency 2x / week    PT  Duration 8 weeks    PT Treatment/Interventions ADLs/Self Care Home Management;Cryotherapy;Electrical Stimulation;DME Instruction;Gait training;Stair training;Balance training;Therapeutic exercise;Therapeutic activities;Functional mobility training;Neuromuscular re-education;Patient/family education;Manual techniques;Scar mobilization;Passive range of motion;Dry needling;Vasopneumatic Device;Taping    PT Next Visit Plan check LTGs & discharge    PT Home Exercise Plan Access Code: [email or web address]    Consulted and Agree with Plan of Care Patient    Family Member Consulted daughter             Patient will benefit from skilled therapeutic intervention in order to improve the following deficits and impairments:  Abnormal gait, Decreased balance, Decreased endurance, Decreased mobility, Difficulty walking, Decreased scar mobility, Decreased range of motion, Decreased activity tolerance, Decreased strength, Impaired flexibility, Pain  Visit Diagnosis: Stiffness of right knee, not elsewhere classified  Muscle weakness (generalized)  Localized edema  Acute pain of right knee  Difficulty in walking, not elsewhere classified  Chronic pain of right knee  Other abnormalities of gait and mobility  Unsteadiness on feet     Problem List Patient Active Problem List   Diagnosis Date Noted   Primary osteoarthritis of right knee 03/08/2021   Status post total right knee replacement 03/08/2021   Lactic acidosis 01/13/2020   Elevated troponin 01/13/2020   Demand ischemia ([place]) 01/13/2020   AKI (acute kidney injury) ([place]) 01/13/2020   Atrial fibrillation ([place]) 01/12/2020   Physical exam 07/02/2015   Eustachian tube dysfunction 05/29/2015   Hyperlipidemia 06/04/2013   PVCs (premature ventricular contractions) 06/04/2013   Snoring 11/08/2011   Sleep disturbance 11/08/2011   Need for prophylactic vaccination and inoculation against influenza 11/08/2011   Essential hypertension, benign 07/13/2011    Tobacco use disorder 07/13/2011    Jamey Reas, PT, DPT 06/02/2021, 5:53 AM  [facility] Physical Therapy [address] [place], [place], [address] Phone: [phone]   Fax:  [phone]  Name: LANASIA PYTLIK MRN: [id number] Date of Birth: Oct 04, 1945

## 2021-06-09 ENCOUNTER — Ambulatory Visit: Payer: Medicare HMO | Admitting: Rehabilitative and Restorative Service Providers"

## 2021-06-09 ENCOUNTER — Other Ambulatory Visit: Payer: Self-pay

## 2021-06-09 ENCOUNTER — Encounter: Payer: Self-pay | Admitting: Rehabilitative and Restorative Service Providers"

## 2021-06-09 DIAGNOSIS — G8929 Other chronic pain: Secondary | ICD-10-CM

## 2021-06-09 DIAGNOSIS — M25661 Stiffness of right knee, not elsewhere classified: Secondary | ICD-10-CM

## 2021-06-09 DIAGNOSIS — R600 Localized edema: Secondary | ICD-10-CM | POA: Diagnosis not present

## 2021-06-09 DIAGNOSIS — R262 Difficulty in walking, not elsewhere classified: Secondary | ICD-10-CM | POA: Diagnosis not present

## 2021-06-09 DIAGNOSIS — M25561 Pain in right knee: Secondary | ICD-10-CM | POA: Diagnosis not present

## 2021-06-09 DIAGNOSIS — M6281 Muscle weakness (generalized): Secondary | ICD-10-CM

## 2021-06-09 NOTE — Therapy (Addendum)
[facility] [address] [place], [place], [address] Phone: [phone]   Fax:  [phone]  PHYSICAL THERAPY DISCHARGE SUMMARY  Visits from Start of Care: 10  Current functional level related to goals / functional outcomes: See below   Remaining deficits: See below   Education / Equipment: HEP   Patient agrees to discharge. Patient goals were partially met. Patient is being discharged due to the physician's request.  Tonya [name], PT, DPT 07/07/2021   Physical Therapy Treatment/Progress Note  Patient Details  Name: Tonya [name] MRN: [id number] Date of Birth: 1946/04/30 Referring Provider (PT): Dwana Melena PA-C  Progress Note Reporting Period 04/12/2021 to 06/09/2021  See note below for Objective Data and Assessment of Progress/Goals.     Encounter Date: 06/09/2021   PT End of Session - 06/09/21 1746     Visit Number 10    Number of Visits 16    Date for PT Re-Evaluation 07/07/21    Authorization Type [employer] MCR    Progress Note Due on Visit 20    PT Start Time 1100    PT Stop Time 1141    PT Time Calculation (min) 41 min    Activity Tolerance Patient tolerated treatment well;No increased pain    Behavior During Therapy WFL for tasks assessed/performed             Past Medical History:  Diagnosis Date   Allergy    Dysrhythmia    Hyperlipidemia    Hypertension    [name] disease    per her report from skin biopsy prior   Osteoarthritis    knee, hips   Plantar fasciitis    right foot   Tobacco use disorder     Past Surgical History:  Procedure Laterality Date   ABDOMINAL HYSTERECTOMY     partial   CHOLECYSTECTOMY  2003   COLONOSCOPY  2010   TOTAL KNEE ARTHROPLASTY Right 03/08/2021   Procedure: RIGHT TOTAL KNEE ARTHROPLASTY;  Surgeon: Leandrew Koyanagi, MD;  Location: [facility];  Service: Orthopedics;  Laterality: Right;   WISDOM TOOTH EXTRACTION      There were no vitals filed for this visit.    Subjective Assessment - 06/09/21 1108     Subjective Tonya [name] reports she is no longer taking pain medication for the knee.  She can sleep well and feels as if she is walking good.  She is a bit beat up today after working late on her house and getting kicked in her knee accidentally by her grandson.    Patient is accompained by: Family member    Pertinent History R TKR 03/08/21, R plantar fasciitis    Limitations Sitting;House hold activities;Lifting;Standing;Walking    How long can you sit comfortably? No problem but some start-up stiffness    How long can you stand comfortably? Hours    How long can you walk comfortably? Community distances but not walking for exercise    Patient Stated Goals to be able to walk without pain    Currently in Pain? Yes    Pain Score 3     Pain Location Knee    Pain Orientation Right    Pain Descriptors / Indicators Tightness;Sore    Pain Type Acute pain    Pain Radiating Towards NA    Pain Onset More than a month ago    Pain Frequency Intermittent    Aggravating Factors  Overuse    Pain Relieving Factors Change of position and rest PRN  Effect of Pain on Daily Activities Doing pretty good    Multiple Pain Sites No                [facility] PT Assessment - 06/09/21 0001       Observation/Other Assessments   Focus on Therapeutic Outcomes (FOTO)  69 (Goal 60, was 38)      ROM / Strength   AROM / PROM / Strength AROM      AROM   Overall AROM  Deficits    AROM Assessment Site Knee    Right/Left Knee Right    Right Knee Extension -6    Right Knee Flexion 128      Strength   Overall Strength Deficits    Strength Assessment Site Knee    Right/Left Knee Left;Right    Right Knee Extension --   50.1 pounds   Left Knee Extension --   55.8 pounds                          [facility] Adult PT Treatment/Exercise - 06/09/21 0001       Exercises   Exercises Knee/Hip      Knee/Hip Exercises: Stretches   Other Knee/Hip Stretches Seated  knee extension stretch (sit on high/low table with foot in a chair-do not externally rotate the hip/foot-and provide self-overpressure above the knee into extension) 3 minutes    Other Knee/Hip Stretches Prone knee extension stretch with 1# weight on R heel for 3 minutes (X2)      Knee/Hip Exercises: Aerobic   Recumbent Bike Seat 7 for 8 minutes      Knee/Hip Exercises: Supine   Quad Sets Strengthening;Both;10 reps;Limitations    Quad Sets Limitations 5 seconds (toes back, press knees down and tighten thighs)                     PT Education - 06/09/21 1744     Education Details Reviewed exam findings and HEP focused on extension AROM (6 degree deficit).    Person(s) Educated Patient    Methods Explanation;Demonstration;Tactile cues;Verbal cues;Handout    Comprehension Verbal cues required;Need further instruction;Returned demonstration;Verbalized understanding;Tactile cues required              PT Short Term Goals - 05/11/21 1422       PT SHORT TERM GOAL #1   Title Pt will be indep with HEP    Status Achieved      PT SHORT TERM GOAL #2   Title Pt will be able to stand x 20 min with 50% less difficulty    Status Achieved      PT SHORT TERM GOAL #3   Title Pt will report improved R knee pain to </=4/10 pain with functional mobility    Status Achieved               PT Long Term Goals - 06/09/21 1745       PT LONG TERM GOAL #1   Title Pt will demo improved R knee ext at rest </=-10 degrees to assist with more normalized gait    Baseline lacking 6 degrees 06/09/2021    Time 4    Period Weeks    Status On-going    Target Date 07/07/21      PT LONG TERM GOAL #2   Title Pt will demo improved R knee flexion to >/= 115 degrees to assist with more normalized transfers    Baseline  126 degrees    Status Achieved      PT LONG TERM GOAL #3   Title Pt will demo improved R LE strength >/=1/2 MMT grade to assist with improved functional mobility    Baseline  90% R quadriceps strength vs L.    Status Achieved      PT LONG TERM GOAL #4   Title Pt will be able to ambulate with LRAD/no AD with improved heel to toe gait x 30 min to shop at the grocery store    Status Achieved                   Plan - 06/09/21 1746     Clinical Impression Statement Anysha is doing great with her new R knee.  Flexion AROM is 128 degrees.  R quadriceps strength is 90% of the uninvolved L.  Of some concern is she lacks 6 degrees of knee extension AROM.  Her HEP is focused on this (3 exercises, all extension).  I would like to follow-up with her in a month as Sophiea is independent with her exercises but still lacks the 6 degrees of extension.  Lacking 3 degrees or less of extension AROM is acceptable and I anticipate she will have this with compliance with the recommended HEP.    Personal Factors and Comorbidities Comorbidity 1    Comorbidities PVCs, afib, R plantar fasciitis    Examination-Activity Limitations Bathing;Hygiene/Grooming;Stairs;Locomotion Level;Stand;Transfers    Examination-Participation Restrictions Shop;Community Activity    Stability/Clinical Decision Making Stable/Uncomplicated    Rehab Potential Good    PT Frequency One time visit    PT Duration 4 weeks    PT Treatment/Interventions ADLs/Self Care Home Management;Cryotherapy;Electrical Stimulation;DME Instruction;Gait training;Stair training;Balance training;Therapeutic exercise;Therapeutic activities;Functional mobility training;Neuromuscular re-education;Patient/family education;Manual techniques;Scar mobilization;Passive range of motion;Dry needling;Vasopneumatic Device;Taping    PT Next Visit Plan Check extension AROM and modify HEP as needed    PT Home Exercise Plan Access Code: [email or web address]    Consulted and Agree with Plan of Care Patient    Family Member Consulted daughter             Patient will benefit from skilled therapeutic intervention in order to improve the following  deficits and impairments:  Abnormal gait, Decreased balance, Decreased endurance, Decreased mobility, Difficulty walking, Decreased scar mobility, Decreased range of motion, Decreased activity tolerance, Decreased strength, Impaired flexibility, Pain  Visit Diagnosis: Difficulty in walking, not elsewhere classified  Muscle weakness (generalized)  Localized edema  Chronic pain of right knee  Stiffness of right knee, not elsewhere classified     Problem List Patient Active Problem List   Diagnosis Date Noted   Primary osteoarthritis of right knee 03/08/2021   Status post total right knee replacement 03/08/2021   Lactic acidosis 01/13/2020   Elevated troponin 01/13/2020   Demand ischemia ([place]) 01/13/2020   AKI (acute kidney injury) ([place]) 01/13/2020   Atrial fibrillation ([place]) 01/12/2020   Physical exam 07/02/2015   Eustachian tube dysfunction 05/29/2015   Hyperlipidemia 06/04/2013   PVCs (premature ventricular contractions) 06/04/2013   Snoring 11/08/2011   Sleep disturbance 11/08/2011   Need for prophylactic vaccination and inoculation against influenza 11/08/2011   Essential hypertension, benign 07/13/2011   Tobacco use disorder 07/13/2011    Farley Ly, PT, MPT 06/09/2021, 5:51 PM  [facility] [address] [place], [place], [address] Phone: [phone]   Fax:  [phone]  Name: CORINE SOLORIO MRN: [id number] Date of Birth: [birth year]

## 2021-06-09 NOTE — Patient Instructions (Signed)
Access Code: [email or web address] URL: [email or web address] Date: 06/09/2021 Prepared by: Vista Mink  Exercises Supine Quadricep Sets - 3-5 x daily - 7 x weekly - 2-3 sets - 10 reps - 5 second hold Seated Passive Knee Extension - 3-5 x daily - 7 x weekly - 1 sets - 1 reps - 3 minutes hold Prone Knee Extension with Ankle Weight - 2 x daily - 7 x weekly - 1 sets - 1 reps - 3-5 minutes hold

## 2021-06-10 ENCOUNTER — Encounter: Payer: Self-pay | Admitting: Orthopaedic Surgery

## 2021-06-10 ENCOUNTER — Ambulatory Visit: Payer: Medicare HMO | Admitting: Orthopaedic Surgery

## 2021-06-10 VITALS — Ht 65.5 in | Wt 163.0 lb

## 2021-06-10 DIAGNOSIS — Z96651 Presence of right artificial knee joint: Secondary | ICD-10-CM

## 2021-06-10 MED ORDER — AMOXICILLIN 500 MG PO CAPS: 2000.0000 mg | ORAL_CAPSULE | Freq: Once | ORAL | 6 refills | Status: AC

## 2021-06-10 NOTE — Progress Notes (Signed)
Post-Op Visit Note   Patient: Tonya [name]           Date of Birth: 04/15/1946           MRN: [id number] Visit Date: 06/10/2021 PCP: Alroy Dust, L.Marlou Sa, MD   Assessment & Plan:  Chief Complaint:  Chief Complaint  Patient presents with   Right Knee - Follow-up    Right total knee arthroplasty 03/08/2021   Visit Diagnoses:  1. Status post total right knee replacement     Plan: [name] is 12 weeks status post right total knee replacement.  She feels that she is doing pretty well.  She is getting physical therapy once a week and very pleased.  Right knee shows a fully healed surgical scar.  She lacks about 5 to 8 degrees of full extension but her flexion is almost 130 degrees.  Stable to varus valgus.  Mild swelling.  No evidence of infection.  Overall Tonya [name] is doing really well and she is very pleased as well.  She will continue to work on getting full extension which I think she will get ultimately.  Her flexion is already excellent.  Dental prophylaxis reinforced.  Recheck in 3 months with two-view x-rays of the right knee.  Follow-Up Instructions: Return in about 3 months (around 09/09/2021).   Orders:  No orders of the defined types were placed in this encounter.  Meds ordered this encounter  Medications   amoxicillin (AMOXIL) 500 MG capsule    Sig: Take 4 capsules (2,000 mg total) by mouth once for 1 dose.    Dispense:  4 capsule    Refill:  6    Imaging: No results found.  PMFS History: Patient Active Problem List   Diagnosis Date Noted   Primary osteoarthritis of right knee 03/08/2021   Status post total right knee replacement 03/08/2021   Lactic acidosis 01/13/2020   Elevated troponin 01/13/2020   Demand ischemia ([place]) 01/13/2020   AKI (acute kidney injury) ([place]) 01/13/2020   Atrial fibrillation ([place]) 01/12/2020   Physical exam 07/02/2015   Eustachian tube dysfunction 05/29/2015   Hyperlipidemia 06/04/2013   PVCs (premature ventricular contractions)  06/04/2013   Snoring 11/08/2011   Sleep disturbance 11/08/2011   Need for prophylactic vaccination and inoculation against influenza 11/08/2011   Essential hypertension, benign 07/13/2011   Tobacco use disorder 07/13/2011   Past Medical History:  Diagnosis Date   Allergy    Dysrhythmia    Hyperlipidemia    Hypertension    [name] disease    per her report from skin biopsy prior   Osteoarthritis    knee, hips   Plantar fasciitis    right foot   Tobacco use disorder     Family History  Problem Relation Age of Onset   Hypertension Mother    Stroke Mother        multiple   Heart disease Father        reportedly died of MI   Pneumonia Brother        died of pneumonia   Other Brother        died in surgery, reportedly due to lung disease   Liver disease Brother        died of NASH    Past Surgical History:  Procedure Laterality Date   ABDOMINAL HYSTERECTOMY     partial   CHOLECYSTECTOMY  2003   COLONOSCOPY  2010   TOTAL KNEE ARTHROPLASTY Right 03/08/2021   Procedure: RIGHT TOTAL KNEE ARTHROPLASTY;  Surgeon:  Leandrew Koyanagi, MD;  Location: [facility];  Service: Orthopedics;  Laterality: Right;   WISDOM TOOTH EXTRACTION     Social History   Occupational History   Not on file  Tobacco Use   Smoking status: Every Day    Packs/day: 1.00    Years: 50.00    Pack years: 50.00    Types: Cigarettes   Smokeless tobacco: Never  Vaping Use   Vaping Use: Never used  Substance and Sexual Activity   Alcohol use: Not Currently    Alcohol/week: 0.0 standard drinks    Comment: occ   Drug use: No   Sexual activity: Never

## 2021-06-14 ENCOUNTER — Other Ambulatory Visit: Payer: Self-pay | Admitting: Physician Assistant

## 2021-06-14 ENCOUNTER — Telehealth: Payer: Self-pay | Admitting: Orthopaedic Surgery

## 2021-06-14 MED ORDER — CLINDAMYCIN HCL 300 MG PO CAPS: ORAL_CAPSULE | ORAL | 2 refills | Status: DC

## 2021-06-14 NOTE — Telephone Encounter (Signed)
Pt called requesting a call back. Pt need pre meds before her dental appt on Thursday 9/15. Please call pt about this matter at [phone]. Send to pharmacy on file

## 2021-06-14 NOTE — Telephone Encounter (Signed)
Sent in

## 2021-06-15 NOTE — Telephone Encounter (Signed)
Called patient no answer. Rx sent into pharm.

## 2021-07-08 ENCOUNTER — Encounter: Payer: Medicare HMO | Admitting: Rehabilitative and Restorative Service Providers"

## 2021-07-29 ENCOUNTER — Encounter: Payer: Self-pay | Admitting: Rehabilitative and Restorative Service Providers"

## 2021-07-29 ENCOUNTER — Ambulatory Visit: Payer: Medicare HMO | Admitting: Rehabilitative and Restorative Service Providers"

## 2021-07-29 ENCOUNTER — Other Ambulatory Visit: Payer: Self-pay

## 2021-07-29 DIAGNOSIS — M6281 Muscle weakness (generalized): Secondary | ICD-10-CM

## 2021-07-29 DIAGNOSIS — M25661 Stiffness of right knee, not elsewhere classified: Secondary | ICD-10-CM | POA: Diagnosis not present

## 2021-07-29 DIAGNOSIS — R600 Localized edema: Secondary | ICD-10-CM

## 2021-07-29 DIAGNOSIS — R262 Difficulty in walking, not elsewhere classified: Secondary | ICD-10-CM | POA: Diagnosis not present

## 2021-07-29 NOTE — Therapy (Signed)
[name] [address] [place], [place], [address] Phone: [phone]   Fax:  [phone]  Physical Therapy Treatment/DC  Patient Details  Name: Tonya [name] MRN: [id number] Date of Birth: 08-Jul-1946 Referring Provider (PT): Dwana Melena PA-C   Encounter Date: 07/29/2021   PT End of Session - 07/29/21 1609     Visit Number 11    Number of Visits 16    Date for PT Re-Evaluation 07/07/21    Authorization Type Aetna MCR    Progress Note Due on Visit 20    PT Start Time 1146    PT Stop Time 1231    PT Time Calculation (min) 45 min    Activity Tolerance Patient tolerated treatment well;No increased pain    Behavior During Therapy WFL for tasks assessed/performed             Past Medical History:  Diagnosis Date   Allergy    Dysrhythmia    Hyperlipidemia    Hypertension    [name] disease    per her report from skin biopsy prior   Osteoarthritis    knee, hips   Plantar fasciitis    right foot   Tobacco use disorder     Past Surgical History:  Procedure Laterality Date   ABDOMINAL HYSTERECTOMY     partial   CHOLECYSTECTOMY  2003   COLONOSCOPY  2010   TOTAL KNEE ARTHROPLASTY Right 03/08/2021   Procedure: RIGHT TOTAL KNEE ARTHROPLASTY;  Surgeon: Leandrew Koyanagi, MD;  Location: [facility];  Service: Orthopedics;  Laterality: Right;   WISDOM TOOTH EXTRACTION      There were no vitals filed for this visit.   Subjective Assessment - 07/29/21 1157     Subjective Tonya [name] reports working very hard on her R knee extension AROM since her last visit.    Patient is accompained by: Family member    Pertinent History R TKR 03/08/21, R plantar fasciitis    Limitations Sitting;House hold activities;Lifting;Standing;Walking    How long can you sit comfortably? No problem but some start-up stiffness    How long can you stand comfortably? Hours    How long can you walk comfortably? Community distances but not walking for exercise     Patient Stated Goals to be able to walk without pain    Currently in Pain? No/denies    Pain Score 0-No pain    Pain Location Knee    Pain Orientation Right    Pain Descriptors / Indicators Tightness    Pain Radiating Towards NA    Pain Onset More than a month ago    Pain Frequency Occasional    Aggravating Factors  Overuse    Pain Relieving Factors Rest and exercises    Effect of Pain on Daily Activities Edema with overuse    Multiple Pain Sites No                [facility] PT Assessment - 07/29/21 0001       Assessment   Medical Diagnosis R TKA    Referring Provider (PT) Dwana Melena PA-C    Onset Date/Surgical Date 03/08/21      Observation/Other Assessments   Focus on Therapeutic Outcomes (FOTO)  69 (Goal 60, was 38)      ROM / Strength   AROM / PROM / Strength AROM      AROM   Overall AROM  Deficits    AROM Assessment Site Knee    Right/Left Knee Right  Right Knee Extension -3    Right Knee Flexion 128                           [facility] Adult PT Treatment/Exercise - 07/29/21 0001       Neuro Re-ed    Neuro Re-ed Details  Single leg stance 10X 10 seconds each leg (plus breaks to re-set and between reps)      Exercises   Exercises Knee/Hip      Knee/Hip Exercises: Stretches   Other Knee/Hip Stretches Seated knee extension stretch (sit on high/low table with foot in a chair-do not externally rotate the hip/foot-and provide self-overpressure above the knee into extension) 3 minutes    Other Knee/Hip Stretches Prone knee extension stretch with 1# weight on R heel for 3 minutes (X2)      Knee/Hip Exercises: Seated   Sit to Sand 10 reps;without UE support;Other (comment)      Knee/Hip Exercises: Supine   Quad Sets Strengthening;Both;10 reps;Limitations   slow eccentrics   Quad Sets Limitations 5 seconds (toes back, press knees down and tighten thighs)                     PT Education - 07/29/21 1608     Education Details  Reviewed HEP, RA findings and answered questions about long-term HEP.    Person(s) Educated Patient    Methods Explanation;Demonstration;Tactile cues;Verbal cues;Handout    Comprehension Verbalized understanding;Tactile cues required;Need further instruction;Returned demonstration;Verbal cues required                 PT Long Term Goals - 07/29/21 1609       PT LONG TERM GOAL #1   Title Pt will demo improved R knee ext at rest </=-10 degrees to assist with more normalized gait    Baseline lacking 3 degrees 07/29/2021    Time 4    Period Weeks    Status Achieved      PT LONG TERM GOAL #2   Title Pt will demo improved R knee flexion to >/= 115 degrees to assist with more normalized transfers    Baseline 126 degrees    Status Achieved      PT LONG TERM GOAL #3   Title Pt will demo improved R LE strength >/=1/2 MMT grade to assist with improved functional mobility    Baseline 90% R quadriceps strength vs L.    Status Achieved      PT LONG TERM GOAL #4   Title Pt will be able to ambulate with LRAD/no AD with improved heel to toe gait x 30 min to shop at the grocery store    [place] - 07/29/21 1610     Clinical Impression Statement Tonya [name] has improved her R knee extension AROM since her last visit ~ 6 weeks ago.  Extension is now lacking 3 degrees (was 6 degrees 6 weeks ago).  Edema and Baker's cyst are certainly contributing factors.  With continued long-term HEP compliance, AROM, strength and self-reported function will continue to improve.  She appears ready to DC from supervised PT.    Personal Factors and Comorbidities Comorbidity 1    Comorbidities PVCs, afib, R plantar fasciitis    Examination-Activity Limitations Bathing;Hygiene/Grooming;Stairs;Locomotion Level;Stand;Transfers    Examination-Participation Restrictions Shop;Community Activity    Stability/Clinical Decision Making Stable/Uncomplicated  Rehab Potential Good     PT Frequency Other (comment)    PT Duration Other (comment)   DC   PT Treatment/Interventions ADLs/Self Care Home Management;Cryotherapy;Electrical Stimulation;DME Instruction;Gait training;Stair training;Balance training;Therapeutic exercise;Therapeutic activities;Functional mobility training;Neuromuscular re-education;Patient/family education;Manual techniques;Scar mobilization;Passive range of motion;Dry needling;Vasopneumatic Device;Taping    PT Next Visit Plan DC today    PT Home Exercise Plan Access Code: [email or web address]    [email or web address] and Agree with Plan of Care Patient             Patient will benefit from skilled therapeutic intervention in order to improve the following deficits and impairments:  Abnormal gait, Decreased balance, Decreased endurance, Decreased mobility, Difficulty walking, Decreased scar mobility, Decreased range of motion, Decreased activity tolerance, Decreased strength, Impaired flexibility, Pain  Visit Diagnosis: Difficulty in walking, not elsewhere classified  Muscle weakness (generalized)  Localized edema  Stiffness of right knee, not elsewhere classified     Problem List Patient Active Problem List   Diagnosis Date Noted   Primary osteoarthritis of right knee 03/08/2021   Status post total right knee replacement 03/08/2021   Lactic acidosis 01/13/2020   Elevated troponin 01/13/2020   Demand ischemia ([place]) 01/13/2020   AKI (acute kidney injury) ([place]) 01/13/2020   Atrial fibrillation ([place]) 01/12/2020   Physical exam 07/02/2015   Eustachian tube dysfunction 05/29/2015   Hyperlipidemia 06/04/2013   PVCs (premature ventricular contractions) 06/04/2013   Snoring 11/08/2011   Sleep disturbance 11/08/2011   Need for prophylactic vaccination and inoculation against influenza 11/08/2011   Essential hypertension, benign 07/13/2011   Tobacco use disorder 07/13/2011    Farley Ly, PT, MPT 07/29/2021, 4:13 PM  [facility] [address] [place], [place], [address] Phone: [phone]   Fax:  [phone]  Name: Tonya [name] MRN: [id number] Date of Birth: Jan 03, 1946

## 2021-07-29 NOTE — Patient Instructions (Signed)
Access Code: [email or web address] URL: [email or web address] Date: 07/29/2021 Prepared by: Vista Mink  Exercises Supine Quadricep Sets - 1-2 x daily - 7 x weekly - 3 sets - 10 reps - 5 second hold Seated Passive Knee Extension - 2 x daily - 7 x weekly - 1 sets - 1 reps - 3 minutes hold Prone Knee Extension with Ankle Weight - 2 x daily - 7 x weekly - 1 sets - 1 reps - 3-5 minutes hold Sit to Stand with Armchair - 1 x daily - 3 x weekly - 1 sets - 10 reps Single Leg Stance - 1 x daily - 7 x weekly - 1 sets - 5 reps - 10 seconds hold

## 2021-09-05 ENCOUNTER — Other Ambulatory Visit: Payer: Self-pay | Admitting: Cardiovascular Disease

## 2021-09-06 ENCOUNTER — Other Ambulatory Visit: Payer: Self-pay

## 2021-09-16 ENCOUNTER — Other Ambulatory Visit: Payer: Self-pay | Admitting: Cardiovascular Disease

## 2021-10-27 ENCOUNTER — Telehealth: Payer: Self-pay | Admitting: Orthopaedic Surgery

## 2021-10-27 NOTE — Telephone Encounter (Signed)
Called patient to advise on message. She does have to take abxs for 2 years after su prior  to procedures/cleanings.

## 2021-10-27 NOTE — Telephone Encounter (Signed)
Pt calling to clarify with the nurse if she needs to take an antibiotic before every dental cleaning if she has had surg with Dr. Erlinda Hong. The best call back number is [phone].

## 2021-11-05 ENCOUNTER — Other Ambulatory Visit: Payer: Self-pay | Admitting: Obstetrics and Gynecology

## 2021-11-05 DIAGNOSIS — R928 Other abnormal and inconclusive findings on diagnostic imaging of breast: Secondary | ICD-10-CM

## 2021-11-15 NOTE — Progress Notes (Signed)
Cardiology Office Note:   Date:  11/17/2021  NAME:  Tonya [name]    MRN: [id number] DOB:  September 16, 1946   PCP:  Alroy Dust, L.Marlou Sa, MD  Cardiologist:  Evalina Field, MD  Electrophysiologist:  None   Referring MD: Aurea Graff.Marlou Sa, MD   Chief Complaint  Patient presents with   Follow-up        History of Present Illness:   Tonya [name] is a 76 y.o. female with a hx of non-obstructive CAD, pAF, HTN, HLD, tobacco abuse who presents follow-up.  She reports she overall is doing well.  Denies any chest pain or trouble breathing.  Not exercising.  Dealing with her sister's estate.  Her sister passed in 2021.  Still having lots of issues with that.  No A-fib episodes.  No rapid heartbeat sensation.  Blood pressure 122/62.  Her most recent cholesterol is not at goal.  We discussed increasing her Lipitor to 40 mg daily.  She is okay to do this.  No symptoms concerning for angina.  All of her other risk factors are well controlled.  Denies any symptoms in office.  Problem List 1. Atrial fibrillation, paroxysmal  -diagnosed 01/13/2020 -mild troponin elevation due to small vessel dz -CHADSVASC=4 -dx with dehydration/lactic acidosis 2. Non-obstructive CAD -25-49% LAD/LCX -50-69% mRCA -negative FFR  -CAC score 1022 3. HTN 4. Tobacco abuse  5. HLD  -T chol 156, HDL 40, LDL 94, triglycerides 118 6. RBBB/LAFB  Past Medical History: Past Medical History:  Diagnosis Date   Allergy    Dysrhythmia    Hyperlipidemia    Hypertension    [name] disease    per her report from skin biopsy prior   Osteoarthritis    knee, hips   Plantar fasciitis    right foot   Tobacco use disorder     Past Surgical History: Past Surgical History:  Procedure Laterality Date   ABDOMINAL HYSTERECTOMY     partial   CHOLECYSTECTOMY  2003   COLONOSCOPY  2010   TOTAL KNEE ARTHROPLASTY Right 03/08/2021   Procedure: RIGHT TOTAL KNEE ARTHROPLASTY;  Surgeon: Leandrew Koyanagi, MD;  Location: [employer];   Service: Orthopedics;  Laterality: Right;   WISDOM TOOTH EXTRACTION      Current Medications: No outpatient medications have been marked as taking for the 11/17/21 encounter (Office Visit) with O'Neal, Cassie Freer, MD.     Allergies:    Penicillins and Meloxicam   Social History: Social History   Socioeconomic History   Marital status: Single    Spouse name: Not on file   Number of children: Not on file   Years of education: Not on file   Highest education level: Not on file  Occupational History   Not on file  Tobacco Use   Smoking status: Every Day    Packs/day: 1.00    Years: 50.00    Pack years: 50.00    Types: Cigarettes   Smokeless tobacco: Never  Vaping Use   Vaping Use: Never used  Substance and Sexual Activity   Alcohol use: Not Currently    Alcohol/week: 0.0 standard drinks    Comment: occ   Drug use: No   Sexual activity: Never  Other Topics Concern   Not on file  Social History Narrative   Not on file   Social Determinants of Health   Financial Resource Strain: Not on file  Food Insecurity: Not on file  Transportation Needs: Not on file  Physical Activity: Not on file  Stress: Not on file  Social Connections: Not on file     Family History: The patient's family history includes Heart disease in her father; Hypertension in her mother; Liver disease in her brother; Other in her brother; Pneumonia in her brother; Stroke in her mother.  ROS:   All other ROS reviewed and negative. Pertinent positives noted in the HPI.     EKGs/Labs/Other Studies Reviewed:   The following studies were personally reviewed by me today:  TTE 01/13/2020  1. Left ventricular ejection fraction, by estimation, is 50 to 55%. The  left ventricle has low normal function. The left ventricle has no regional  wall motion abnormalities. Left ventricular diastolic parameters are  consistent with Grade I diastolic  dysfunction (impaired relaxation).   2. Right ventricular  systolic function is normal. The right ventricular  size is normal. There is moderately elevated pulmonary artery systolic  pressure.   3. Left atrial size was mildly dilated.   4. The mitral valve is normal in structure. Mild mitral valve  regurgitation. No evidence of mitral stenosis.   5. Tricuspid valve regurgitation is mild to moderate.   6. The aortic valve is normal in structure. Aortic valve regurgitation is  trivial. No aortic stenosis is present.   7. The inferior vena cava is dilated in size with <50% respiratory  variability, suggesting right atrial pressure of 15 mmHg.   CCTA 07/09/2019 IMPRESSION: 1. Coronary calcium score of 1022. This was 97th percentile for age and sex matched control.   2. Normal coronary origin with right dominance.   3. Mild calcified CAD in the LAD and LCX (25-49%) that is non-obstructive.   4. Likely mild calcified CAD in the mRCA that is non-obstructive but cannot exclude a moderate stenosis.   5. Moderate aortic atherosclerosis.  Recent Labs: 02/25/2021: ALT 17 03/09/2021: BUN 23; Creatinine, Ser 0.92; Hemoglobin 11.1; Platelets 230; Potassium 4.2; Sodium 141   Recent Lipid Panel    Component Value Date/Time   CHOL 115 01/13/2020 0426   TRIG 90 01/13/2020 0426   HDL 31 (L) 01/13/2020 0426   CHOLHDL 3.7 01/13/2020 0426   VLDL 18 01/13/2020 0426   LDLCALC 66 01/13/2020 0426    Physical Exam:   VS:  BP 122/62    Pulse 81    Ht 5\' 5"  (1.651 m)    Wt 152 lb 9.6 oz (69.2 kg)    SpO2 99%    BMI 25.39 kg/m    Wt Readings from Last 3 Encounters:  11/17/21 152 lb 9.6 oz (69.2 kg)  06/10/21 163 lb (73.9 kg)  03/23/21 163 lb (73.9 kg)    General: Well nourished, well developed, in no acute distress Head: Atraumatic, normal size  Eyes: PEERLA, EOMI  Neck: Supple, no JVD Endocrine: No thryomegaly Cardiac: Normal S1, S2; RRR; no murmurs, rubs, or gallops Lungs: Clear to auscultation bilaterally, no wheezing, rhonchi or rales  Abd: Soft,  nontender, no hepatomegaly  Ext: No edema, pulses 2+ Musculoskeletal: No deformities, BUE and BLE strength normal and equal Skin: Warm and dry, no rashes   Neuro: Alert and oriented to person, place, time, and situation, CNII-XII grossly intact, no focal deficits  Psych: Normal mood and affect   ASSESSMENT:   Tonya [name] is a 76 y.o. female who presents for the following: 1. Coronary artery disease involving native coronary artery of native heart without angina pectoris   2. Mixed hyperlipidemia   3. Paroxysmal atrial fibrillation (HCC)   4. Acquired thrombophilia (  [place])   5. RBBB   6. LAFB (left anterior fascicular block)     PLAN:   1. Coronary artery disease involving native coronary artery of native heart without angina pectoris 2. Mixed hyperlipidemia -History of nonobstructive CAD on coronary CTA in the past.  She has small vessel disease.  FFR's were negative for any epicardial stenoses.  No symptoms of angina.  Not on aspirin as she is on Eliquis. -She is on Lipitor 20 mg daily.  Most recent LDL cholesterol not at goal.  94.  We will increase her Lipitor to 40 mg daily.  This should get her value to an acceptable level. -She is not exercising.  Have asked her to become more active.  She is going through a lot with her sister's estate.  Suspect she will become more active after this. -She is on a beta-blocker for A-fib.  This also treats her CAD.  Echo shows normal LV function.  3. Paroxysmal atrial fibrillation (HCC) 4. Acquired thrombophilia ([place]) -History of paroxysmal atrial fibrillation.  No further recurrence.  CHA2DS2-VASc score of 5.  She will continue Eliquis 5 mg twice daily.  No major bleeding episodes reported.  She is on metoprolol succinate 25 mg daily.  No recurrence of A-fib.  5. RBBB 6. LAFB (left anterior fascicular block) -No symptoms of dizziness or lightheadedness.  Tolerating beta-blocker well.  We will keep a close eye on this.  Echo shows normal LV  function.  Disposition: Return in about 1 year (around 11/17/2022).  Medication Adjustments/Labs and Tests Ordered: Current medicines are reviewed at length with the patient today.  Concerns regarding medicines are outlined above.  No orders of the defined types were placed in this encounter.  Meds ordered this encounter  Medications   atorvastatin (LIPITOR) 40 MG tablet    Sig: Take 1 tablet (40 mg total) by mouth daily.    Dispense:  90 tablet    Refill:  1    Patient Instructions  Medication Instructions:  Increase Lipitor to 40 mg daily   *If you need a refill on your cardiac medications before your next appointment, please call your pharmacy*  Follow-Up: At [facility], you and your health needs are our priority.  As part of our continuing mission to provide you with exceptional heart care, we have created designated Provider Care Teams.  These Care Teams include your primary Cardiologist (physician) and Advanced Practice Providers (APPs -  Physician Assistants and Nurse Practitioners) who all work together to provide you with the care you need, when you need it.  We recommend signing up for the patient portal called "MyChart".  Sign up information is provided on this After Visit Summary.  MyChart is used to connect with patients for Virtual Visits (Telemedicine).  Patients are able to view lab/test results, encounter notes, upcoming appointments, etc.  Non-urgent messages can be sent to your provider as well.   To learn more about what you can do with MyChart, go to [email or web address].    Your next appointment:   12 month(s)  The format for your next appointment:   In Person  Provider:   Eleonore Chiquito, MD or Sande Rives, PA-C, or Almyra Deforest, PA-C{      Time Spent with Patient: I have spent a total of 35 minutes with patient reviewing hospital notes, telemetry, EKGs, labs and examining the patient as well as establishing an assessment and plan that was discussed  with the patient.  > 50% of time was spent  in direct patient care.  Signed, Addison Naegeli. Audie Box, MD, [place]  [address] [place], [place] [address] [phone]  11/17/2021 10:37 AM

## 2021-11-17 ENCOUNTER — Ambulatory Visit: Payer: Medicare HMO | Admitting: Cardiovascular Disease

## 2021-11-17 ENCOUNTER — Other Ambulatory Visit: Payer: Self-pay

## 2021-11-17 ENCOUNTER — Encounter: Payer: Self-pay | Admitting: Cardiovascular Disease

## 2021-11-17 VITALS — BP 122/62 | HR 81 | Ht 65.0 in | Wt 152.6 lb

## 2021-11-17 DIAGNOSIS — I2510 Atherosclerotic heart disease of native coronary artery without angina pectoris: Secondary | ICD-10-CM

## 2021-11-17 DIAGNOSIS — Z720 Tobacco use: Secondary | ICD-10-CM

## 2021-11-17 DIAGNOSIS — I480 Paroxysmal atrial fibrillation: Secondary | ICD-10-CM

## 2021-11-17 DIAGNOSIS — D6869 Other thrombophilia: Secondary | ICD-10-CM

## 2021-11-17 DIAGNOSIS — I4510 Unspecified right bundle-branch block: Secondary | ICD-10-CM

## 2021-11-17 DIAGNOSIS — I444 Left anterior fascicular block: Secondary | ICD-10-CM

## 2021-11-17 DIAGNOSIS — E782 Mixed hyperlipidemia: Secondary | ICD-10-CM | POA: Diagnosis not present

## 2021-11-17 MED ORDER — ATORVASTATIN CALCIUM 40 MG PO TABS: 40.0000 mg | ORAL_TABLET | Freq: Every day | ORAL | 1 refills | Status: DC

## 2021-11-17 NOTE — Patient Instructions (Signed)
Medication Instructions:  Increase Lipitor to 40 mg daily   *If you need a refill on your cardiac medications before your next appointment, please call your pharmacy*  Follow-Up: At [facility], you and your health needs are our priority.  As part of our continuing mission to provide you with exceptional heart care, we have created designated Provider Care Teams.  These Care Teams include your primary Cardiologist (physician) and Advanced Practice Providers (APPs -  Physician Assistants and Nurse Practitioners) who all work together to provide you with the care you need, when you need it.  We recommend signing up for the patient portal called "MyChart".  Sign up information is provided on this After Visit Summary.  MyChart is used to connect with patients for Virtual Visits (Telemedicine).  Patients are able to view lab/test results, encounter notes, upcoming appointments, etc.  Non-urgent messages can be sent to your provider as well.   To learn more about what you can do with MyChart, go to [email or web address].    Your next appointment:   12 month(s)  The format for your next appointment:   In Person  Provider:   Eleonore Chiquito, MD or Sande Rives, PA-C, or Almyra Deforest, PA-C{

## 2021-11-29 ENCOUNTER — Ambulatory Visit: Admission: RE | Admit: 2021-11-29 | Payer: Medicare HMO | Source: Ambulatory Visit

## 2021-11-29 ENCOUNTER — Ambulatory Visit
Admission: RE | Admit: 2021-11-29 | Discharge: 2021-11-29 | Disposition: A | Discharge status: Home / Self Care | Payer: Medicare HMO | Source: Ambulatory Visit | Attending: Obstetrics and Gynecology | Admitting: Obstetrics and Gynecology

## 2021-11-29 ENCOUNTER — Other Ambulatory Visit: Payer: Self-pay

## 2021-11-29 DIAGNOSIS — R928 Other abnormal and inconclusive findings on diagnostic imaging of breast: Secondary | ICD-10-CM

## 2022-02-22 ENCOUNTER — Other Ambulatory Visit: Payer: Self-pay | Admitting: Physician Assistant

## 2022-02-22 ENCOUNTER — Telehealth: Payer: Self-pay | Admitting: Orthopaedic Surgery

## 2022-02-22 MED ORDER — CLINDAMYCIN HCL 300 MG PO CAPS: ORAL_CAPSULE | ORAL | 2 refills | Status: DC

## 2022-02-22 NOTE — Telephone Encounter (Signed)
Patient aware.

## 2022-02-22 NOTE — Telephone Encounter (Signed)
Sent in

## 2022-02-22 NOTE — Telephone Encounter (Signed)
Please send antiobiotics  to [facility] [address]   Teeth cleaning 5-25

## 2022-03-07 ENCOUNTER — Other Ambulatory Visit: Payer: Self-pay | Admitting: Cardiovascular Disease

## 2022-03-07 NOTE — Telephone Encounter (Signed)
Prescription refill request for Eliquis received. Indication:Afib Last office visit:2/23 Scr:1.2 Age: 76 Weight:69.2 kg  Prescription refilled

## 2022-05-19 ENCOUNTER — Other Ambulatory Visit: Payer: Self-pay | Admitting: Cardiovascular Disease

## 2022-05-23 ENCOUNTER — Telehealth: Payer: Self-pay | Admitting: Orthopaedic Surgery

## 2022-05-23 NOTE — Telephone Encounter (Signed)
Pt called and states she needs another handicap placker form filled out.   CB [phone]

## 2022-05-24 NOTE — Telephone Encounter (Signed)
yes

## 2022-05-24 NOTE — Telephone Encounter (Signed)
Spoke with patient. Leaving handicap placard up front.

## 2022-09-11 ENCOUNTER — Other Ambulatory Visit: Payer: Self-pay | Admitting: Cardiovascular Disease

## 2022-09-13 ENCOUNTER — Other Ambulatory Visit: Payer: Self-pay | Admitting: Family Medicine

## 2022-09-13 DIAGNOSIS — Z122 Encounter for screening for malignant neoplasm of respiratory organs: Secondary | ICD-10-CM

## 2022-10-25 ENCOUNTER — Ambulatory Visit
Admission: RE | Admit: 2022-10-25 | Discharge: 2022-10-25 | Disposition: A | Discharge status: Home / Self Care | Payer: Medicare HMO | Source: Ambulatory Visit | Attending: Family Medicine | Admitting: Family Medicine

## 2022-10-25 DIAGNOSIS — Z122 Encounter for screening for malignant neoplasm of respiratory organs: Secondary | ICD-10-CM

## 2022-11-04 ENCOUNTER — Other Ambulatory Visit: Payer: Self-pay | Admitting: Obstetrics and Gynecology

## 2022-11-04 DIAGNOSIS — N6489 Other specified disorders of breast: Secondary | ICD-10-CM

## 2022-11-17 ENCOUNTER — Ambulatory Visit
Admission: RE | Admit: 2022-11-17 | Discharge: 2022-11-17 | Disposition: A | Discharge status: Home / Self Care | Payer: Medicare HMO | Source: Ambulatory Visit | Attending: Obstetrics and Gynecology | Admitting: Obstetrics and Gynecology

## 2022-11-17 ENCOUNTER — Ambulatory Visit: Admission: RE | Admit: 2022-11-17 | Payer: Medicare HMO | Source: Ambulatory Visit

## 2022-11-17 DIAGNOSIS — N6489 Other specified disorders of breast: Secondary | ICD-10-CM

## 2022-11-23 ENCOUNTER — Telehealth: Payer: Self-pay | Admitting: Orthopaedic Surgery

## 2022-11-23 ENCOUNTER — Telehealth: Payer: Self-pay | Admitting: Physician Assistant

## 2022-11-23 NOTE — Telephone Encounter (Signed)
Ok for 3 month temp

## 2022-11-23 NOTE — Telephone Encounter (Signed)
Patient called back advising she is having trouble walking outside and walking on cement. Patient said she si fine when she is inside. The number to contact patient is [phone]

## 2022-11-23 NOTE — Telephone Encounter (Signed)
Patient need to renew her Handicap placard.Tonya [name]

## 2022-11-23 NOTE — Telephone Encounter (Signed)
Looks like we have not seen her in 1.5 years and she seemed to be doing fine from knee replacement at that time.  Why does she need this?

## 2022-12-15 ENCOUNTER — Ambulatory Visit: Payer: Medicare HMO | Admitting: Orthopaedic Surgery

## 2022-12-15 ENCOUNTER — Other Ambulatory Visit (INDEPENDENT_AMBULATORY_CARE_PROVIDER_SITE_OTHER): Payer: Medicare HMO

## 2022-12-15 ENCOUNTER — Encounter: Payer: Self-pay | Admitting: Orthopaedic Surgery

## 2022-12-15 DIAGNOSIS — Z96651 Presence of right artificial knee joint: Secondary | ICD-10-CM

## 2022-12-15 NOTE — Progress Notes (Signed)
Office Visit Note   Patient: Tonya [name]           Date of Birth: [birth year]           MRN: [id number] Visit Date: 12/15/2022              Requested by: Alroy Dust, L.Marlou Sa, [place] [address] [place],  [place] [address] PCP: Alroy Dust, L.Marlou Sa, MD   Assessment & Plan: Visit Diagnoses:  1. Status post total right knee replacement     Plan: 77 year old female with subjective sensation of knee buckling inward.  Impression is that the effusion is what is probably causing her symptoms.  Her collaterals feel really solid.  I recommended a compression knee sleeve for this.  Activity as tolerated.  Follow-up as needed.  Follow-Up Instructions: No follow-ups on file.   Orders:  Orders Placed This Encounter  Procedures   XR KNEE 3 VIEW RIGHT   No orders of the defined types were placed in this encounter.     Procedures: No procedures performed   Clinical Data: No additional findings.   Subjective: Chief Complaint  Patient presents with   Right Leg - Pain    HPI  Tonya [name] is a 77 year old female who underwent a right total knee replacement on 03/08/2021.  She has been lost to follow-up since her 10-monthpostop visit.  She states that recently she has noticed that when she gets up from most seated position her knee buckles inward.  Denies any pain she feels like it is just annoying.  Denies any injuries.  Review of Systems   Objective: Vital Signs: There were no vitals taken for this visit.  Physical Exam  Ortho Exam  Examination shows fully healed surgical scar.  She does have small joint effusion.  Her collaterals feel really solid.  Range of motion is normal.  Specialty Comments:  No specialty comments available.  Imaging: XR KNEE 3 VIEW RIGHT  Result Date: 12/15/2022 Three-view x-rays show stable right total knee replacement without any complications.  Implants are in good alignment.    PMFS History: Patient Active Problem List   Diagnosis Date  Noted   Primary osteoarthritis of right knee 03/08/2021   Status post total right knee replacement 03/08/2021   Lactic acidosis 01/13/2020   Elevated troponin 01/13/2020   Demand ischemia 01/13/2020   [place] 01/13/2020   Atrial fibrillation ([place] 01/12/2020   Physical exam 07/02/2015   Eustachian tube dysfunction 05/29/2015   Hyperlipidemia 06/04/2013   PVCs (premature ventricular contractions) 06/04/2013   Snoring 11/08/2011   Sleep disturbance 11/08/2011   Need for prophylactic vaccination and inoculation against influenza 11/08/2011   Essential hypertension, benign 07/13/2011   Tobacco use disorder 07/13/2011   Past Medical History:  Diagnosis Date   Allergy    Dysrhythmia    Hyperlipidemia    Hypertension    [name] disease    per her report from skin biopsy prior   Osteoarthritis    knee, hips   Plantar fasciitis    right foot   Tobacco use disorder     Family History  Problem Relation Age of Onset   Hypertension Mother    Stroke Mother        multiple   Heart disease Father        reportedly died of MI   Pneumonia Brother        died of pneumonia   Other Brother  died in surgery, reportedly due to lung disease   Liver disease Brother        died of NASH    Past Surgical History:  Procedure Laterality Date   ABDOMINAL HYSTERECTOMY     partial   CHOLECYSTECTOMY  2003   COLONOSCOPY  2010   TOTAL KNEE ARTHROPLASTY Right 03/08/2021   Procedure: RIGHT TOTAL KNEE ARTHROPLASTY;  Surgeon: Leandrew Koyanagi, MD;  Location: [facility];  Service: Orthopedics;  Laterality: Right;   WISDOM TOOTH EXTRACTION     Social History   Occupational History   Not on file  Tobacco Use   Smoking status: Every Day    Packs/day: 1.00    Years: 50.00    Additional pack years: 0.00    Total pack years: 50.00    Types: Cigarettes   Smokeless tobacco: Never  Vaping Use   Vaping Use: Never used  Substance and Sexual Activity   Alcohol use: Not  Currently    Alcohol/week: 0.0 standard drinks of alcohol    Comment: occ   Drug use: No   Sexual activity: Never

## 2023-03-06 ENCOUNTER — Telehealth: Payer: Self-pay | Admitting: *Deleted

## 2023-03-06 NOTE — Telephone Encounter (Signed)
   Pre-operative Risk Assessment    Patient Name: Tonya [name]  DOB: 11-25-1945 MRN: [id number]      Request for Surgical Clearance    Procedure:   COLONOSCOPY, UNINTENTIONAL WEIGHT LOSS, H/O POLYPS, CHRONIC DIARRHEA   Date of Surgery:  Clearance TBD JULY 2024                                 Surgeon:  DR. Liliane Shi Surgeon's Group or Practice Name:  [facility] Phone number:  [phone] Fax number:  [phone]   Type of Clearance Requested:   - Medical  - Pharmacy:  Hold Apixaban (Eliquis) x 2 DAYS PRIOR   Type of Anesthesia:   PROPOFOL   Additional requests/questions:    [name]   03/06/2023, 1:52 PM

## 2023-03-07 NOTE — Telephone Encounter (Signed)
Patient with diagnosis of afib on Eliquis for anticoagulation.    Procedure: colonoscopy Date of procedure: 04/2023  CHA2DS2-VASc Score = 5  This indicates a 7.2% annual risk of stroke. The patient's score is based upon: CHF History: 0 HTN History: 1 Diabetes History: 0 Stroke History: 0 Vascular Disease History: 1 Age Score: 2 Gender Score: 1  CrCl 41mL/min Platelet count 230K  As long as no new clinical concerns are noted at upcoming office visit, patient can hold Eliquis for 2 days prior to procedure as requested.    **This guidance is not considered finalized until pre-operative APP has relayed final recommendations.**

## 2023-03-07 NOTE — Telephone Encounter (Signed)
I left a message for the patient to call our office to schedule a in office visit for pre-op clearance.

## 2023-03-07 NOTE — Telephone Encounter (Signed)
   Name: Tonya [name]  DOB: 1946-02-23  MRN: [id number]  Primary Cardiologist: Reatha Harps, MD  Chart reviewed as part of pre-operative protocol coverage. Because of Murel Stallone Seevers's past medical history and time since last visit, she will require a follow-up in-office visit in order to better assess preoperative cardiovascular risk.  Pre-op covering staff: - Please schedule appointment and call patient to inform them. If patient already had an upcoming appointment within acceptable timeframe, please add "pre-op clearance" to the appointment notes so provider is aware. - Please contact requesting surgeon's office via preferred method (i.e, phone, fax) to inform them of need for appointment prior to surgery.  This message will also be routed to pharmacy pool for input on holding Eliquis as requested below so that this information is available to the clearing provider at time of patient's appointment.   Carlos Levering, NP  03/07/2023, 10:08 AM

## 2023-03-08 NOTE — Telephone Encounter (Signed)
Pt has been scheduled for an appt with Bernadene Person, NP 03/16/23 @ 1:55. Pt is grateful for the call today and they help. I will update all parties involved.

## 2023-03-16 ENCOUNTER — Encounter: Payer: Self-pay | Admitting: Nurse Practitioner

## 2023-03-16 ENCOUNTER — Ambulatory Visit: Payer: Medicare HMO | Attending: Nurse Practitioner | Admitting: Nurse Practitioner

## 2023-03-16 VITALS — BP 90/60 | HR 53 | Ht 65.0 in | Wt 144.2 lb

## 2023-03-16 DIAGNOSIS — I959 Hypotension, unspecified: Secondary | ICD-10-CM

## 2023-03-16 DIAGNOSIS — E785 Hyperlipidemia, unspecified: Secondary | ICD-10-CM | POA: Diagnosis not present

## 2023-03-16 DIAGNOSIS — F17200 Nicotine dependence, unspecified, uncomplicated: Secondary | ICD-10-CM

## 2023-03-16 DIAGNOSIS — I2510 Atherosclerotic heart disease of native coronary artery without angina pectoris: Secondary | ICD-10-CM | POA: Diagnosis not present

## 2023-03-16 DIAGNOSIS — I10 Essential (primary) hypertension: Secondary | ICD-10-CM | POA: Diagnosis not present

## 2023-03-16 DIAGNOSIS — Z01810 Encounter for preprocedural cardiovascular examination: Secondary | ICD-10-CM

## 2023-03-16 DIAGNOSIS — I480 Paroxysmal atrial fibrillation: Secondary | ICD-10-CM

## 2023-03-16 DIAGNOSIS — R001 Bradycardia, unspecified: Secondary | ICD-10-CM

## 2023-03-16 MED ORDER — VALSARTAN 160 MG PO TABS: 160.0000 mg | ORAL_TABLET | Freq: Every day | ORAL | 3 refills | Status: DC

## 2023-03-16 NOTE — Patient Instructions (Addendum)
Medication Instructions:  Decrease Metoprolol 12.5 mg daily Stop Valsartan-Hydrochlorothiazide as directed. Start Valsartan 160 mg daily  *If you need a refill on your cardiac medications before your next appointment, please call your pharmacy*   Lab Work: NONE ordered at this time of appointment     Testing/Procedures: NONE ordered at this time of appointment     Follow-Up: At [facility], you and your health needs are our priority.  As part of our continuing mission to provide you with exceptional heart care, we have created designated Provider Care Teams.  These Care Teams include your primary Cardiologist (physician) and Advanced Practice Providers (APPs -  Physician Assistants and Nurse Practitioners) who all work together to provide you with the care you need, when you need it.  We recommend signing up for the patient portal called "MyChart".  Sign up information is provided on this After Visit Summary.  MyChart is used to connect with patients for Virtual Visits (Telemedicine).  Patients are able to view lab/test results, encounter notes, upcoming appointments, etc.  Non-urgent messages can be sent to your provider as well.   To learn more about what you can do with MyChart, go to [email or web address].    Your next appointment:   2 week(s)  Provider:   Bernadene Person, NP        Other Instructions Monitor Blood Pressure. Report systolic BP (top number) consistently less than 100 or higher than 140.

## 2023-03-16 NOTE — Progress Notes (Signed)
Office Visit    Patient Name: Tonya [name] Date of Encounter: 03/16/2023  Primary Care Provider:  Clovis Riley, L.August Saucer, MD Primary Cardiologist:  Reatha Harps, MD  Chief Complaint    77 year old female with a history of nonobstructive CAD, paroxysmal atrial fibrillation, hypertension, hyperlipidemia, and tobacco use who presents for follow-up related to CAD and for preoperative cardiac evaluation.  Past Medical History    Past Medical History:  Diagnosis Date   Allergy    Dysrhythmia    Hyperlipidemia    Hypertension    [name] disease    per her report from skin biopsy prior   Osteoarthritis    knee, hips   Plantar fasciitis    right foot   Tobacco use disorder    Past Surgical History:  Procedure Laterality Date   ABDOMINAL HYSTERECTOMY     partial   CHOLECYSTECTOMY  2003   COLONOSCOPY  2010   TOTAL KNEE ARTHROPLASTY Right 03/08/2021   Procedure: RIGHT TOTAL KNEE ARTHROPLASTY;  Surgeon: Tarry Kos, MD;  Location: [facility] OR;  Service: Orthopedics;  Laterality: Right;   WISDOM TOOTH EXTRACTION      Allergies  Allergies  Allergen Reactions   Penicillins Rash    Reaction : unknown   Meloxicam     Other reaction(s): stomach upset     Labs/Other Studies Reviewed    The following studies were reviewed today:  Cardiac Studies & Procedures       ECHOCARDIOGRAM  ECHOCARDIOGRAM COMPLETE 01/13/2020  Narrative ECHOCARDIOGRAM REPORT    Patient Name:   Tonya [name] Date of Exam: 01/13/2020 Medical Rec #:  [id number]        Height:       65.0 in Accession #:    [id number]       Weight:       155.7 lb Date of Birth:  06-18-1946        BSA:          1.778 m Patient Age:    73 years         BP:           103/46 mmHg Patient Gender: F                HR:           51 bpm. Exam Location:  Inpatient  Procedure: 2D Echo, Cardiac Doppler and Color Doppler  Indications:    Atrial Fibrillation 427.31  History:        Patient has no prior history of  Echocardiogram examinations. Arrythmias:Atrial Fibrillation and PVC; Risk Factors:Hypertension, Dyslipidemia and Current Smoker.  Sonographer:    Renella Cunas RDCS Referring Phys: [id number] [facility]  IMPRESSIONS   1. Left ventricular ejection fraction, by estimation, is 50 to 55%. The left ventricle has low normal function. The left ventricle has no regional wall motion abnormalities. Left ventricular diastolic parameters are consistent with Grade I diastolic dysfunction (impaired relaxation). 2. Right ventricular systolic function is normal. The right ventricular size is normal. There is moderately elevated pulmonary artery systolic pressure. 3. Left atrial size was mildly dilated. 4. The mitral valve is normal in structure. Mild mitral valve regurgitation. No evidence of mitral stenosis. 5. Tricuspid valve regurgitation is mild to moderate. 6. The aortic valve is normal in structure. Aortic valve regurgitation is trivial. No aortic stenosis is present. 7. The inferior vena cava is dilated in size with <50% respiratory variability, suggesting right atrial pressure of 15 mmHg.  FINDINGS Left  Ventricle: Left ventricular ejection fraction, by estimation, is 50 to 55%. The left ventricle has low normal function. The left ventricle has no regional wall motion abnormalities. The left ventricular internal cavity size was normal in size. There is no left ventricular hypertrophy. Left ventricular diastolic parameters are consistent with Grade I diastolic dysfunction (impaired relaxation). Normal left ventricular filling pressure.  Right Ventricle: The right ventricular size is normal. No increase in right ventricular wall thickness. Right ventricular systolic function is normal. There is moderately elevated pulmonary artery systolic pressure. The tricuspid regurgitant velocity is 2.73 m/s, and with an assumed right atrial pressure of 15 mmHg, the estimated right ventricular systolic pressure  is 44.8 mmHg.  Left Atrium: Left atrial size was mildly dilated.  Right Atrium: Right atrial size was normal in size.  Pericardium: There is no evidence of pericardial effusion.  Mitral Valve: The mitral valve is normal in structure. Normal mobility of the mitral valve leaflets. Mild mitral valve regurgitation. No evidence of mitral valve stenosis.  Tricuspid Valve: The tricuspid valve is normal in structure. Tricuspid valve regurgitation is mild to moderate. No evidence of tricuspid stenosis.  Aortic Valve: The aortic valve is normal in structure. Aortic valve regurgitation is trivial. No aortic stenosis is present.  Pulmonic Valve: The pulmonic valve was normal in structure. Pulmonic valve regurgitation is trivial. No evidence of pulmonic stenosis.  Aorta: The aortic root is normal in size and structure.  Venous: The inferior vena cava is dilated in size with less than 50% respiratory variability, suggesting right atrial pressure of 15 mmHg.  IAS/Shunts: No atrial level shunt detected by color flow Doppler.   LEFT VENTRICLE PLAX 2D LVIDd:         4.30 cm      Diastology LVIDs:         3.20 cm      LV e' lateral:   7.07 cm/s LV PW:         1.00 cm      LV E/e' lateral: 7.0 LV IVS:        1.00 cm      LV e' medial:    5.87 cm/s LVOT diam:     1.90 cm      LV E/e' medial:  8.4 LV SV:         59 LV SV Index:   33 LVOT Area:     2.84 cm  LV Volumes (MOD) LV vol d, MOD A2C: 113.0 ml LV vol d, MOD A4C: 89.2 ml LV vol s, MOD A2C: 57.5 ml LV vol s, MOD A4C: 49.2 ml LV SV MOD A2C:     55.5 ml LV SV MOD A4C:     89.2 ml LV SV MOD BP:      48.2 ml  RIGHT VENTRICLE RV S prime:     9.14 cm/s TAPSE (M-mode): 2.0 cm  LEFT ATRIUM             Index       RIGHT ATRIUM           Index LA diam:        3.20 cm 1.80 cm/m  RA Area:     14.70 cm LA Vol (A2C):   56.1 ml 31.54 ml/m RA Volume:   38.20 ml  21.48 ml/m LA Vol (A4C):   43.8 ml 24.63 ml/m LA Biplane Vol: 50.5 ml 28.40  ml/m AORTIC VALVE LVOT Vmax:   89.40 cm/s LVOT Vmean:  57.300 cm/s LVOT VTI:  0.209 m  AORTA Ao Root diam: 3.50 cm Ao Asc diam:  3.50 cm  MITRAL VALVE                 TRICUSPID VALVE MV Area (PHT): 2.16 cm      TR Peak grad:   29.8 mmHg MV Decel Time: 352 msec      TR Vmax:        273.00 cm/s MR Peak grad:    108.6 mmHg MR Mean grad:    74.0 mmHg   SHUNTS MR Vmax:         521.00 cm/s Systemic VTI:  0.21 m MR Vmean:        402.0 cm/s  Systemic Diam: 1.90 cm MR PISA:         0.57 cm MR PISA Eff ROA: 4 mm MR PISA Radius:  0.30 cm MV E velocity: 49.50 cm/s MV A velocity: 74.80 cm/s MV E/A ratio:  0.66  Chilton Si MD Electronically signed by Chilton Si MD Signature Date/Time: 01/13/2020/6:13:23 PM    Final     CT SCANS  CT CORONARY MORPH W/CTA COR W/SCORE 07/09/2019  Addendum 07/09/2019  5:50 PM ADDENDUM REPORT: 07/09/2019 17:48  CLINICAL DATA:  Chest pain  EXAM: Cardiac/Coronary CTA  TECHNIQUE: The patient was scanned on a [employer]. A 100 kV prospective scan was triggered in the descending thoracic aorta at 111 HU's. Axial non-contrast 3 mm slices were carried out through the heart. The data set was analyzed on a dedicated work station and scored using the Agatson method. Gantry rotation speed was 250 msecs and collimation was .6 mm. No beta blockade and 0.8 mg of sl NTG was given. The 3D data set was reconstructed in 5% intervals of the 35-75 % of the R-R cycle. Diastolic phases were analyzed on a dedicated work station using MPR, MIP and VRT modes. The patient received 80 cc of contrast.  FINDINGS: Image quality: excellent.  Noise artifact is: Limited.  Coronary Arteries:  Normal coronary origin.  Right dominance.  Left main: The left main is a large caliber vessel with a normal take off from the left coronary cusp that bifurcates to form a left anterior descending artery and a left circumflex artery. There is minimal (<25%)  calcified plaque in the distal LM.  Left anterior descending artery: The proximal LAD contains calcified plaque that is mild in severity (25-49%). The mLAD contains calcified plaque that is mild in severity (<25%). There is a distal myocardial bridge. The distal LAD is patent without plaque or stenosis. The LAD gives off 2 patent diagonal branches.  Left circumflex artery: The LCX is non-dominant and heavily calcified with non-obstructive disease. The ostial LCX contains calcified plaque that is minimal in severity (<25%). The proximal LCX contains minimal calcified plaque (<25%). The mid LCX contains mild calcified plaque (25-49%). The distal LCX is patent without plaque or stenosis. The LCX gives off 1 large patent obtuse marginal system.  Right coronary artery: The RCA is dominant with normal take off from the right coronary cusp. The proximal RCA contains mild calcified plaque (25-49%). The mRCA contains at least mild calcified plaque (25-49%) but unable to exclude a moderate stenosis due to blooming artifact. The distal RCA contains minimal mixed density plaque (<25%). The RCA terminates as a PDA and right posterolateral branch without evidence of plaque or stenosis.  Right Atrium: Right atrial size is within normal limits.  Right Ventricle: The right ventricular cavity is within normal limits.  Left Atrium: Left atrial size is normal in size with no left atrial appendage filling defect.  Left Ventricle: The ventricular cavity size is within normal limits. There are no stigmata of prior infarction. There is no abnormal filling defect.  Pulmonary arteries: Normal in size without proximal filling defect.  Pulmonary veins: Normal pulmonary venous drainage.  Pericardium: Normal thickness with no significant effusion or calcium present.  Cardiac valves: The aortic valve is trileaflet without significant calcification. The mitral valve is normal structure  without significant calcification.  Aorta: Normal caliber. Minimal aortic root calcification. Moderate atherosclerosis noted in the descending aorta.  Extra-cardiac findings: See attached radiology report for non-cardiac structures.  IMPRESSION: 1. Coronary calcium score of 1022. This was 97th percentile for age and sex matched control.  2. Normal coronary origin with right dominance.  3. Mild calcified CAD in the LAD and LCX (25-49%) that is non-obstructive.  4. Likely mild calcified CAD in the mRCA that is non-obstructive but cannot exclude a moderate stenosis.  5. Moderate aortic atherosclerosis.  RECOMMENDATIONS: 1. Consider aggressive risk factor modification.  2. Will send CT-FFR to exclude significant stenosis in the mRCA.  Lennie Odor, MD   Electronically Signed By: Lennie Odor On: 07/09/2019 17:48  Narrative EXAM: OVER-READ INTERPRETATION  CT CHEST  The following report is an over-read performed by radiologist Dr. Genevive Bi of [facility] on 07/09/2019. This over-read does not include interpretation of cardiac or coronary anatomy or pathology. The coronary CTA interpretation by the cardiologist is attached.  COMPARISON:  None.  FINDINGS: Limited view of the lung parenchyma demonstrates no suspicious nodularity. Airways are normal.  Limited view of the mediastinum demonstrates no adenopathy. Esophagus normal.  Limited view of the upper abdomen unremarkable.  Limited view of the skeleton and chest wall is unremarkable.  IMPRESSION: No significant extracardiac findings.  Electronically Signed: By: Genevive Bi M.D. On: 07/09/2019 12:50         Recent Labs: No results found for requested labs within last 365 days.  Recent Lipid Panel    Component Value Date/Time   CHOL 115 01/13/2020 0426   TRIG 90 01/13/2020 0426   HDL 31 (L) 01/13/2020 0426   CHOLHDL 3.7 01/13/2020 0426   VLDL 18 01/13/2020 0426   LDLCALC 66  01/13/2020 0426    History of Present Illness    77 year old female with the above past medical history including nonobstructive CAD, paroxysmal atrial fibrillation, hypertension, hyperlipidemia, and tobacco use.  Coronary CTA in 07/2019 revealed coronary calcium score of 1022 (97 percentile), mild to moderate nonobstructive CAD with small vessel disease, negative FFR.  She was diagnosed with atrial fibrillation in 2021. Echocardiogram at the time showed EF 50 to 55%, low normal LV function, no RWMA, G1 DD, normal RV systolic function, mild mitral valve regurgitation.  He has been stable on metoprolol and Eliquis.  She was last seen in the office on 11/17/2021 and was doing well from a cardiac standpoint.  She denies any symptoms concerning for angina, denies any recurrent atrial fibrillation.  She presents today for follow-up and for preoperative cardiac evaluation for upcoming colonoscopy with Dr. Lorenso Quarry of [facility] with request to hold Eliquis for 2 days prior to procedure. Since her last visit he has been stable overall from a cardiac standpoint. BP and heart rate are low in office today. She does note occasional dizziness, denies palpitations, presyncope, syncope.  She also reports recent weight loss, decreased appetite.  Otherwise, she reports feeling well.  Home Medications    Current Outpatient Medications  Medication Sig Dispense Refill   Apoaequorin (PREVAGEN) 10 MG CAPS Take 10 mg by mouth daily.     atorvastatin (LIPITOR) 40 MG tablet Take 1 tablet (40 mg total) by mouth daily. 90 tablet 1   cetirizine (ZYRTEC) 10 MG tablet Take 1 tablet (10 mg total) by mouth daily. (Patient taking differently: Take 10 mg by mouth daily as needed for allergies.) 30 tablet 11   cholecalciferol (VITAMIN D) 25 MCG (1000 UNIT) tablet Take 1,000 Units by mouth daily.     clindamycin (CLEOCIN) 300 MG capsule Take two pills one hour prior to dental appointment 6 capsule 2   ELIQUIS 5 MG TABS tablet TAKE 1  TABLET BY MOUTH TWICE A DAY 180 tablet 3   fluticasone (FLONASE) 50 MCG/ACT nasal spray Place 2 sprays into both nostrils daily. (Patient taking differently: Place 2 sprays into both nostrils daily as needed for allergies or rhinitis.) 16 g 6   latanoprost (XALATAN) 0.005 % ophthalmic solution Place 1 drop into both eyes at bedtime.     metoprolol succinate (TOPROL-XL) 25 MG 24 hr tablet TAKE 1 TABLET BY MOUTH EVERY DAY 90 tablet 3   valsartan (DIOVAN) 160 MG tablet Take 1 tablet (160 mg total) by mouth daily. 90 tablet 3   azelastine (ASTELIN) 0.1 % nasal spray Place 1 spray into both nostrils 2 (two) times daily as needed. (Patient not taking: Reported on 03/16/2023)     No current facility-administered medications for this visit.     Review of Systems    She denies chest pain, palpitations, dyspnea, pnd, orthopnea, n, v, syncope, edema, weight gain, or early satiety. All other systems reviewed and are otherwise negative except as noted above.   Physical Exam    VS:  BP 90/60   Pulse (!) 53   Ht 5\' 5"  (1.651 m)   Wt 144 lb 3.2 oz (65.4 kg)   SpO2 96%   BMI 24.00 kg/m  GEN: Well nourished, well developed, in no acute distress. HEENT: normal. Neck: Supple, no JVD, carotid bruits, or masses. Cardiac: RRR, no murmurs, rubs, or gallops. No clubbing, cyanosis, edema.  Radials/DP/PT 2+ and equal bilaterally.  Respiratory:  Respirations regular and unlabored, clear to auscultation bilaterally. GI: Soft, nontender, nondistended, BS + x 4. MS: no deformity or atrophy. Skin: warm and dry, no rash. Neuro:  Strength and sensation are intact. Psych: Normal affect.  Accessory Clinical Findings    ECG personally reviewed by me today -sinus bradycardia, 53 bpm, RBBB, LAFB- no acute changes.   Lab Results  Component Value Date   WBC 12.0 (H) 03/09/2021   HGB 11.1 (L) 03/09/2021   HCT 34.3 (L) 03/09/2021   MCV 97.4 03/09/2021   PLT 230 03/09/2021   Lab Results  Component Value Date    CREATININE 0.92 03/09/2021   BUN 23 03/09/2021   NA 141 03/09/2021   K 4.2 03/09/2021   CL 108 03/09/2021   CO2 29 03/09/2021   Lab Results  Component Value Date   ALT 17 02/25/2021   AST 19 02/25/2021   ALKPHOS 40 02/25/2021   BILITOT 0.6 02/25/2021   Lab Results  Component Value Date   CHOL 115 01/13/2020   HDL 31 (L) 01/13/2020   LDLCALC 66 01/13/2020   TRIG 90 01/13/2020   CHOLHDL 3.7 01/13/2020    Lab Results  Component Value Date   HGBA1C 6.0 (H) 01/12/2020    Assessment & Plan  1. CAD: Coronary CTA in 07/2019 revealed coronary calcium score of 1022 (97 percentile), mild to moderate nonobstructive CAD with small vessel disease, negative FFR. Stable with no anginal symptoms. No indication for ischemic evaluation.  No ASA in the setting of chronic DOAC therapy.  Continue metoprolol as below, valsartan as below, Lipitor.  2. Paroxysmal atrial fibrillation/bradycardia: Maintaining sinus rhythm.  EKG today shows sinus bradycardia, HR 53 bpm.She denies any presyncope, syncope, denies any awareness of recurrent atrial fibrillation. Will decrease metoprolol to 12.5 mg daily in the setting of recent lightheadedness, bradycardia.  Continue Eliquis.  3. Hypertension/hypotension: BP is low in office today upon arrival and upon recheck.  She has noted recent dizziness.  In the setting, will stop valsartan-hydrochlorothiazide.  Will transition to valsartan 160 mg daily.  4. Hyperlipidemia: LDL was 96 in 09/2022.  She does not think she was fasting for these labs.  Will plan for repeat fasting lipid panel at next follow-up visit.  Continue Lipitor.  5. Tobacco use: She has cut back significantly and is trying to quit smoking.  Full cessation advised.  6. Preoperative cardiac exam: According to the Revised Cardiac Risk Index (RCRI), her Perioperative Risk of Major Cardiac Event is (%): 0.4. Her Functional Capacity in METs is: 6.27 according to the Duke Activity Status Index (DASI).  Therefore, based on ACC/AHA guidelines, patient would be at acceptable risk for the planned procedure without further cardiovascular testing.  However, in the setting of hypotension, bradycardia, and recent dizziness, will adjust medications as above and reevaluate in 2 weeks.  If stable, she will be cleared for her procedure.  If she is cleared, per office protocol, she may hold Eliquis for 2 days prior to procedure. Please resume Eliqius as soon as possible postprocedure, at the discretion of the surgeon.   7. Disposition: Follow-up in 2 weeks.     Joylene Grapes, NP 03/16/2023, 2:11 PM

## 2023-03-28 ENCOUNTER — Telehealth: Payer: Self-pay

## 2023-03-28 NOTE — Telephone Encounter (Signed)
Spoke with patient who is agreeable to do a tele visit on 7/2 at 2:40 pm. Med rec and consent done.

## 2023-03-28 NOTE — Telephone Encounter (Signed)
  Patient Consent for Virtual Visit        [name] has provided verbal consent on 03/28/2023 for a virtual visit (video or telephone).   CONSENT FOR VIRTUAL VISIT FOR:  Tonya [name]  By participating in this virtual visit I agree to the following:  I hereby voluntarily request, consent and authorize [facility] and its employed or contracted physicians, physician assistants, nurse practitioners or other licensed health care professionals (the Practitioner), to provide me with telemedicine health care services (the "Services") as deemed necessary by the treating Practitioner. I acknowledge and consent to receive the Services by the Practitioner via telemedicine. I understand that the telemedicine visit will involve communicating with the Practitioner through live audiovisual communication technology and the disclosure of certain medical information by electronic transmission. I acknowledge that I have been given the opportunity to request an in-person assessment or other available alternative prior to the telemedicine visit and am voluntarily participating in the telemedicine visit.  I understand that I have the right to withhold or withdraw my consent to the use of telemedicine in the course of my care at any time, without affecting my right to future care or treatment, and that the Practitioner or I may terminate the telemedicine visit at any time. I understand that I have the right to inspect all information obtained and/or recorded in the course of the telemedicine visit and may receive copies of available information for a reasonable fee.  I understand that some of the potential risks of receiving the Services via telemedicine include:  Delay or interruption in medical evaluation due to technological equipment failure or disruption; Information transmitted may not be sufficient (e.g. poor resolution of images) to allow for appropriate medical decision making by the  Practitioner; and/or  In rare instances, security protocols could fail, causing a breach of personal health information.  Furthermore, I acknowledge that it is my responsibility to provide information about my medical history, conditions and care that is complete and accurate to the best of my ability. I acknowledge that Practitioner's advice, recommendations, and/or decision may be based on factors not within their control, such as incomplete or inaccurate data provided by me or distortions of diagnostic images or specimens that may result from electronic transmissions. I understand that the practice of medicine is not an exact science and that Practitioner makes no warranties or guarantees regarding treatment outcomes. I acknowledge that a copy of this consent can be made available to me via my patient portal [facility] MyChart), or I can request a printed copy by calling the office of [facility].    I understand that my insurance will be billed for this visit.   I have read or had this consent read to me. I understand the contents of this consent, which adequately explains the benefits and risks of the Services being provided via telemedicine.  I have been provided ample opportunity to ask questions regarding this consent and the Services and have had my questions answered to my satisfaction. I give my informed consent for the services to be provided through the use of telemedicine in my medical care

## 2023-03-28 NOTE — Telephone Encounter (Signed)
Caller noted patient's clearance appointment was rescheduled and wants to know if patient can still hold her Eliquis.  Caller noted patient is now scheduled for surgery on July 10.

## 2023-03-28 NOTE — Telephone Encounter (Signed)
   Name: Tonya [name]  DOB: 1946/01/14  MRN: [id number]  Primary Cardiologist: Reatha Harps, MD   Preoperative team, please contact this patient and set up a phone call appointment for further preoperative risk assessment. Please obtain consent and complete medication review. Thank you for your help.  I confirm that guidance regarding antiplatelet and oral anticoagulation therapy has been completed and, if necessary, noted below.  As long as no new clinical concerns are noted at upcoming office visit, patient can hold Eliquis for 2 days prior to procedure as requested.    Ronney Asters, NP 03/28/2023, 2:40 PM [facility]

## 2023-03-31 ENCOUNTER — Ambulatory Visit: Payer: Medicare HMO | Admitting: Nurse Practitioner

## 2023-04-04 ENCOUNTER — Ambulatory Visit: Payer: Medicare HMO | Attending: Interventional Cardiology

## 2023-04-04 DIAGNOSIS — Z01810 Encounter for preprocedural cardiovascular examination: Secondary | ICD-10-CM | POA: Diagnosis not present

## 2023-04-04 NOTE — Progress Notes (Signed)
Virtual Visit via Telephone Note   Because of Tonya [name]'s co-morbid illnesses, she is at least at moderate risk for complications without adequate follow up.  This format is felt to be most appropriate for this patient at this time.  The patient did not have access to video technology/had technical difficulties with video requiring transitioning to audio format only (telephone).  All issues noted in this document were discussed and addressed.  No physical exam could be performed with this format.  Please refer to the patient's chart for her consent to telehealth for [facility].  Evaluation Performed:  Preoperative cardiovascular risk assessment _____________   Date:  04/04/2023   Patient ID:  Tonya [name], DOB 1945/11/16, MRN [id number] Patient Location:  Home Provider location:   Office  Primary Care Provider:  Clovis Riley, L.August Saucer, MD Primary Cardiologist:  Reatha Harps, MD  Chief Complaint / Patient Profile   77 y.o. y/o female with a h/o nonobstructive CAD, paroxysmal atrial fibrillation, hypertension, hyperlipidemia, and tobacco abuse  who is pending colonoscopy and presents today for telephonic preoperative cardiovascular risk assessment.  History of Present Illness    Tonya [name] is a 77 y.o. female who presents via audio/video conferencing for a telehealth visit today.  Pt was last seen in cardiology clinic on 03/16/2023 by Bernadene Person, NP.  At that time [name] was doing well but blood pressure and heart rate were low and adjustments were made to medications. The patient is now pending procedure as outlined above. Since her last visit, she has been doing much better since blood pressure medications were adjusted.  She does note blood pressure in the 140s over 90s and patient was encouraged to continue to check and reach back out to her cardiologist if blood pressures are above goal of 130/80.    Past Medical History    Past Medical History:   Diagnosis Date   Allergy    Dysrhythmia    Hyperlipidemia    Hypertension    [name] disease    per her report from skin biopsy prior   Osteoarthritis    knee, hips   Plantar fasciitis    right foot   Tobacco use disorder    Past Surgical History:  Procedure Laterality Date   ABDOMINAL HYSTERECTOMY     partial   CHOLECYSTECTOMY  2003   COLONOSCOPY  2010   TOTAL KNEE ARTHROPLASTY Right 03/08/2021   Procedure: RIGHT TOTAL KNEE ARTHROPLASTY;  Surgeon: Tarry Kos, MD;  Location: [facility] OR;  Service: Orthopedics;  Laterality: Right;   WISDOM TOOTH EXTRACTION      Allergies  Allergies  Allergen Reactions   Penicillins Rash    Reaction : unknown   Meloxicam     Other reaction(s): stomach upset    Home Medications    Prior to Admission medications   Medication Sig Start Date End Date Taking? Authorizing Provider  Apoaequorin (PREVAGEN) 10 MG CAPS Take 10 mg by mouth daily.    [provider]  atorvastatin (LIPITOR) 40 MG tablet Take 1 tablet (40 mg total) by mouth daily. 05/20/22   O'Neal, Ronnald Ramp, MD  azelastine (ASTELIN) 0.1 % nasal spray Place 1 spray into both nostrils 2 (two) times daily as needed. 01/28/21   [provider]  cetirizine (ZYRTEC) 10 MG tablet Take 1 tablet (10 mg total) by mouth daily. Patient taking differently: Take 10 mg by mouth daily as needed for allergies. 05/29/15   Sheliah Hatch, MD  cholecalciferol (VITAMIN D) 25 MCG (1000 UNIT) tablet Take 1,000 Units by mouth daily.    [provider]  clindamycin (CLEOCIN) 300 MG capsule Take two pills one hour prior to dental appointment 02/22/22   Cristie Hem, PA-C  ELIQUIS 5 MG TABS tablet TAKE 1 TABLET BY MOUTH TWICE A DAY 03/07/22   O'Neal, Ronnald Ramp, MD  fluticasone (FLONASE) 50 MCG/ACT nasal spray Place 2 sprays into both nostrils daily. Patient taking differently: Place 2 sprays into both nostrils daily as needed for allergies or rhinitis. 05/29/15    Sheliah Hatch, MD  latanoprost (XALATAN) 0.005 % ophthalmic solution Place 1 drop into both eyes at bedtime.    [provider]  metoprolol succinate (TOPROL-XL) 25 MG 24 hr tablet TAKE 1 TABLET BY MOUTH EVERY DAY Patient not taking: Reported on 03/28/2023 09/14/22   Sande Rives, MD  valsartan (DIOVAN) 160 MG tablet Take 1 tablet (160 mg total) by mouth daily. 03/16/23   Joylene Grapes, NP    Physical Exam    Vital Signs:  [name] does not have vital signs available for review today. 145/90  Given telephonic nature of communication, physical exam is limited. AAOx3. NAD. Normal affect.  Speech and respirations are unlabored.  Accessory Clinical Findings    None  Assessment & Plan    1.  Preoperative Cardiovascular Risk Assessment:  Patient's RCRI score 0.4%  The patient affirms she has been doing well without any new cardiac symptoms. They are able to achieve 5 METS without cardiac limitations. Therefore, based on ACC/AHA guidelines, the patient would be at acceptable risk for the planned procedure without further cardiovascular testing. The patient was advised that if she develops new symptoms prior to surgery to contact our office to arrange for a follow-up visit, and she verbalized understanding.  The patient was advised that if she develops new symptoms prior to surgery to contact our office to arrange for a follow-up visit, and she verbalized understanding.  Patient can hold Eliquis for 2 days prior to procedure and should restart postprocedure when hemostasis is achieved and directed by performing provider.  A copy of this note will be routed to requesting surgeon.  Time:   Today, I have spent 10 minutes with the patient with telehealth technology discussing medical history, symptoms, and management plan.     Napoleon Form, Leodis Rains, NP  04/04/2023, 7:25 AM

## 2023-04-07 ENCOUNTER — Other Ambulatory Visit: Payer: Self-pay | Admitting: Adult Health

## 2023-04-07 DIAGNOSIS — R911 Solitary pulmonary nodule: Secondary | ICD-10-CM

## 2023-04-14 ENCOUNTER — Ambulatory Visit: Payer: Medicare HMO | Attending: Nurse Practitioner | Admitting: Nurse Practitioner

## 2023-04-14 ENCOUNTER — Encounter: Payer: Self-pay | Admitting: Nurse Practitioner

## 2023-04-14 VITALS — BP 110/66 | HR 66 | Ht 65.0 in | Wt 143.8 lb

## 2023-04-14 DIAGNOSIS — R001 Bradycardia, unspecified: Secondary | ICD-10-CM | POA: Diagnosis not present

## 2023-04-14 DIAGNOSIS — I2510 Atherosclerotic heart disease of native coronary artery without angina pectoris: Secondary | ICD-10-CM | POA: Diagnosis not present

## 2023-04-14 DIAGNOSIS — I480 Paroxysmal atrial fibrillation: Secondary | ICD-10-CM | POA: Diagnosis not present

## 2023-04-14 DIAGNOSIS — I10 Essential (primary) hypertension: Secondary | ICD-10-CM | POA: Diagnosis not present

## 2023-04-14 DIAGNOSIS — I959 Hypotension, unspecified: Secondary | ICD-10-CM

## 2023-04-14 DIAGNOSIS — F17200 Nicotine dependence, unspecified, uncomplicated: Secondary | ICD-10-CM

## 2023-04-14 DIAGNOSIS — E785 Hyperlipidemia, unspecified: Secondary | ICD-10-CM

## 2023-04-14 MED ORDER — VALSARTAN-HYDROCHLOROTHIAZIDE 320-25 MG PO TABS: 1.0000 | ORAL_TABLET | Freq: Every day | ORAL | 3 refills | Status: DC

## 2023-04-14 NOTE — Patient Instructions (Addendum)
Medication Instructions:  Stop Metoprolol Stop Valsartan Restart Valsartan/hydrochlorothiazide 320-12.5 mg daily  *If you need a refill on your cardiac medications before your next appointment, please call your pharmacy*   Lab Work: Your physician recommends that you return for lab work at your convenience. Fasting Lipid panel, CMET    Testing/Procedures: NONE ordered at this time of appointment     Follow-Up: At [facility], you and your health needs are our priority.  As part of our continuing mission to provide you with exceptional heart care, we have created designated Provider Care Teams.  These Care Teams include your primary Cardiologist (physician) and Advanced Practice Providers (APPs -  Physician Assistants and Nurse Practitioners) who all work together to provide you with the care you need, when you need it.  We recommend signing up for the patient portal called "MyChart".  Sign up information is provided on this After Visit Summary.  MyChart is used to connect with patients for Virtual Visits (Telemedicine).  Patients are able to view lab/test results, encounter notes, upcoming appointments, etc.  Non-urgent messages can be sent to your provider as well.   To learn more about what you can do with MyChart, go to [email or web address].    Your next appointment:   4-6 month(s)  Provider:   Bernadene Person, NP        Other Instructions Referral sent to Pharm-D

## 2023-04-14 NOTE — Progress Notes (Signed)
Office Visit    Patient Name: Tonya [name] Date of Encounter: 04/14/2023  Primary Care Provider:  Clovis Riley, L.August Saucer, MD Primary Cardiologist:  Reatha Harps, MD  Chief Complaint    77 year old female with a history of nonobstructive CAD, paroxysmal atrial fibrillation, hypertension, hyperlipidemia, and tobacco use who presents for follow-up related to CAD, hypertension and bradycardia.  Past Medical History    Past Medical History:  Diagnosis Date   Allergy    Dysrhythmia    Hyperlipidemia    Hypertension    [name] disease    per her report from skin biopsy prior   Osteoarthritis    knee, hips   Plantar fasciitis    right foot   Tobacco use disorder    Past Surgical History:  Procedure Laterality Date   ABDOMINAL HYSTERECTOMY     partial   CHOLECYSTECTOMY  2003   COLONOSCOPY  2010   TOTAL KNEE ARTHROPLASTY Right 03/08/2021   Procedure: RIGHT TOTAL KNEE ARTHROPLASTY;  Surgeon: Tarry Kos, MD;  Location: [facility] OR;  Service: Orthopedics;  Laterality: Right;   WISDOM TOOTH EXTRACTION      Allergies  Allergies  Allergen Reactions   Penicillins Rash    Reaction : unknown   Meloxicam     Other reaction(s): stomach upset     Labs/Other Studies Reviewed    The following studies were reviewed today:  Cardiac Studies & Procedures       ECHOCARDIOGRAM  ECHOCARDIOGRAM COMPLETE 01/13/2020  Narrative ECHOCARDIOGRAM REPORT    Patient Name:   Tonya [name] Date of Exam: 01/13/2020 Medical Rec #:  [id number]        Height:       65.0 in Accession #:    [id number]       Weight:       155.7 lb Date of Birth:  01-Jul-1946        BSA:          1.778 m Patient Age:    73 years         BP:           103/46 mmHg Patient Gender: F                HR:           51 bpm. Exam Location:  Inpatient  Procedure: 2D Echo, Cardiac Doppler and Color Doppler  Indications:    Atrial Fibrillation 427.31  History:        Patient has no prior history of  Echocardiogram examinations. Arrythmias:Atrial Fibrillation and PVC; Risk Factors:Hypertension, Dyslipidemia and Current Smoker.  Sonographer:    Renella Cunas RDCS Referring Phys: [id number] [facility]  IMPRESSIONS   1. Left ventricular ejection fraction, by estimation, is 50 to 55%. The left ventricle has low normal function. The left ventricle has no regional wall motion abnormalities. Left ventricular diastolic parameters are consistent with Grade I diastolic dysfunction (impaired relaxation). 2. Right ventricular systolic function is normal. The right ventricular size is normal. There is moderately elevated pulmonary artery systolic pressure. 3. Left atrial size was mildly dilated. 4. The mitral valve is normal in structure. Mild mitral valve regurgitation. No evidence of mitral stenosis. 5. Tricuspid valve regurgitation is mild to moderate. 6. The aortic valve is normal in structure. Aortic valve regurgitation is trivial. No aortic stenosis is present. 7. The inferior vena cava is dilated in size with <50% respiratory variability, suggesting right atrial pressure of 15 mmHg.  FINDINGS Left Ventricle: Left  ventricular ejection fraction, by estimation, is 50 to 55%. The left ventricle has low normal function. The left ventricle has no regional wall motion abnormalities. The left ventricular internal cavity size was normal in size. There is no left ventricular hypertrophy. Left ventricular diastolic parameters are consistent with Grade I diastolic dysfunction (impaired relaxation). Normal left ventricular filling pressure.  Right Ventricle: The right ventricular size is normal. No increase in right ventricular wall thickness. Right ventricular systolic function is normal. There is moderately elevated pulmonary artery systolic pressure. The tricuspid regurgitant velocity is 2.73 m/s, and with an assumed right atrial pressure of 15 mmHg, the estimated right ventricular systolic pressure  is 44.8 mmHg.  Left Atrium: Left atrial size was mildly dilated.  Right Atrium: Right atrial size was normal in size.  Pericardium: There is no evidence of pericardial effusion.  Mitral Valve: The mitral valve is normal in structure. Normal mobility of the mitral valve leaflets. Mild mitral valve regurgitation. No evidence of mitral valve stenosis.  Tricuspid Valve: The tricuspid valve is normal in structure. Tricuspid valve regurgitation is mild to moderate. No evidence of tricuspid stenosis.  Aortic Valve: The aortic valve is normal in structure. Aortic valve regurgitation is trivial. No aortic stenosis is present.  Pulmonic Valve: The pulmonic valve was normal in structure. Pulmonic valve regurgitation is trivial. No evidence of pulmonic stenosis.  Aorta: The aortic root is normal in size and structure.  Venous: The inferior vena cava is dilated in size with less than 50% respiratory variability, suggesting right atrial pressure of 15 mmHg.  IAS/Shunts: No atrial level shunt detected by color flow Doppler.   LEFT VENTRICLE PLAX 2D LVIDd:         4.30 cm      Diastology LVIDs:         3.20 cm      LV e' lateral:   7.07 cm/s LV PW:         1.00 cm      LV E/e' lateral: 7.0 LV IVS:        1.00 cm      LV e' medial:    5.87 cm/s LVOT diam:     1.90 cm      LV E/e' medial:  8.4 LV SV:         59 LV SV Index:   33 LVOT Area:     2.84 cm  LV Volumes (MOD) LV vol d, MOD A2C: 113.0 ml LV vol d, MOD A4C: 89.2 ml LV vol s, MOD A2C: 57.5 ml LV vol s, MOD A4C: 49.2 ml LV SV MOD A2C:     55.5 ml LV SV MOD A4C:     89.2 ml LV SV MOD BP:      48.2 ml  RIGHT VENTRICLE RV S prime:     9.14 cm/s TAPSE (M-mode): 2.0 cm  LEFT ATRIUM             Index       RIGHT ATRIUM           Index LA diam:        3.20 cm 1.80 cm/m  RA Area:     14.70 cm LA Vol (A2C):   56.1 ml 31.54 ml/m RA Volume:   38.20 ml  21.48 ml/m LA Vol (A4C):   43.8 ml 24.63 ml/m LA Biplane Vol: 50.5 ml 28.40  ml/m AORTIC VALVE LVOT Vmax:   89.40 cm/s LVOT Vmean:  57.300 cm/s LVOT VTI:  0.209 m  AORTA Ao Root diam: 3.50 cm Ao Asc diam:  3.50 cm  MITRAL VALVE                 TRICUSPID VALVE MV Area (PHT): 2.16 cm      TR Peak grad:   29.8 mmHg MV Decel Time: 352 msec      TR Vmax:        273.00 cm/s MR Peak grad:    108.6 mmHg MR Mean grad:    74.0 mmHg   SHUNTS MR Vmax:         521.00 cm/s Systemic VTI:  0.21 m MR Vmean:        402.0 cm/s  Systemic Diam: 1.90 cm MR PISA:         0.57 cm MR PISA Eff ROA: 4 mm MR PISA Radius:  0.30 cm MV E velocity: 49.50 cm/s MV A velocity: 74.80 cm/s MV E/A ratio:  0.66  Chilton Si MD Electronically signed by Chilton Si MD Signature Date/Time: 01/13/2020/6:13:23 PM    Final     CT SCANS  CT CORONARY MORPH W/CTA COR W/SCORE 07/09/2019  Addendum 07/09/2019  5:50 PM ADDENDUM REPORT: 07/09/2019 17:48  CLINICAL DATA:  Chest pain  EXAM: Cardiac/Coronary CTA  TECHNIQUE: The patient was scanned on a [employer]. A 100 kV prospective scan was triggered in the descending thoracic aorta at 111 HU's. Axial non-contrast 3 mm slices were carried out through the heart. The data set was analyzed on a dedicated work station and scored using the Agatson method. Gantry rotation speed was 250 msecs and collimation was .6 mm. No beta blockade and 0.8 mg of sl NTG was given. The 3D data set was reconstructed in 5% intervals of the 35-75 % of the R-R cycle. Diastolic phases were analyzed on a dedicated work station using MPR, MIP and VRT modes. The patient received 80 cc of contrast.  FINDINGS: Image quality: excellent.  Noise artifact is: Limited.  Coronary Arteries:  Normal coronary origin.  Right dominance.  Left main: The left main is a large caliber vessel with a normal take off from the left coronary cusp that bifurcates to form a left anterior descending artery and a left circumflex artery. There is minimal (<25%)  calcified plaque in the distal LM.  Left anterior descending artery: The proximal LAD contains calcified plaque that is mild in severity (25-49%). The mLAD contains calcified plaque that is mild in severity (<25%). There is a distal myocardial bridge. The distal LAD is patent without plaque or stenosis. The LAD gives off 2 patent diagonal branches.  Left circumflex artery: The LCX is non-dominant and heavily calcified with non-obstructive disease. The ostial LCX contains calcified plaque that is minimal in severity (<25%). The proximal LCX contains minimal calcified plaque (<25%). The mid LCX contains mild calcified plaque (25-49%). The distal LCX is patent without plaque or stenosis. The LCX gives off 1 large patent obtuse marginal system.  Right coronary artery: The RCA is dominant with normal take off from the right coronary cusp. The proximal RCA contains mild calcified plaque (25-49%). The mRCA contains at least mild calcified plaque (25-49%) but unable to exclude a moderate stenosis due to blooming artifact. The distal RCA contains minimal mixed density plaque (<25%). The RCA terminates as a PDA and right posterolateral branch without evidence of plaque or stenosis.  Right Atrium: Right atrial size is within normal limits.  Right Ventricle: The right ventricular cavity is within normal limits.  Left Atrium: Left atrial size is normal in size with no left atrial appendage filling defect.  Left Ventricle: The ventricular cavity size is within normal limits. There are no stigmata of prior infarction. There is no abnormal filling defect.  Pulmonary arteries: Normal in size without proximal filling defect.  Pulmonary veins: Normal pulmonary venous drainage.  Pericardium: Normal thickness with no significant effusion or calcium present.  Cardiac valves: The aortic valve is trileaflet without significant calcification. The mitral valve is normal structure  without significant calcification.  Aorta: Normal caliber. Minimal aortic root calcification. Moderate atherosclerosis noted in the descending aorta.  Extra-cardiac findings: See attached radiology report for non-cardiac structures.  IMPRESSION: 1. Coronary calcium score of 1022. This was 97th percentile for age and sex matched control.  2. Normal coronary origin with right dominance.  3. Mild calcified CAD in the LAD and LCX (25-49%) that is non-obstructive.  4. Likely mild calcified CAD in the mRCA that is non-obstructive but cannot exclude a moderate stenosis.  5. Moderate aortic atherosclerosis.  RECOMMENDATIONS: 1. Consider aggressive risk factor modification.  2. Will send CT-FFR to exclude significant stenosis in the mRCA.  Lennie Odor, MD   Electronically Signed By: Lennie Odor On: 07/09/2019 17:48  Narrative EXAM: OVER-READ INTERPRETATION  CT CHEST  The following report is an over-read performed by radiologist Dr. Genevive Bi of [facility] on 07/09/2019. This over-read does not include interpretation of cardiac or coronary anatomy or pathology. The coronary CTA interpretation by the cardiologist is attached.  COMPARISON:  None.  FINDINGS: Limited view of the lung parenchyma demonstrates no suspicious nodularity. Airways are normal.  Limited view of the mediastinum demonstrates no adenopathy. Esophagus normal.  Limited view of the upper abdomen unremarkable.  Limited view of the skeleton and chest wall is unremarkable.  IMPRESSION: No significant extracardiac findings.  Electronically Signed: By: Genevive Bi M.D. On: 07/09/2019 12:50         Recent Labs: No results found for requested labs within last 365 days.  Recent Lipid Panel    Component Value Date/Time   CHOL 115 01/13/2020 0426   TRIG 90 01/13/2020 0426   HDL 31 (L) 01/13/2020 0426   CHOLHDL 3.7 01/13/2020 0426   VLDL 18 01/13/2020 0426   LDLCALC 66  01/13/2020 0426    History of Present Illness    77 year old female with the above past medical history including nonobstructive CAD, paroxysmal atrial fibrillation, hypertension, hyperlipidemia, and tobacco use.   Coronary CTA in 07/2019 revealed coronary calcium score of 1022 (97 percentile), mild to moderate nonobstructive CAD with small vessel disease, negative FFR.  She was diagnosed with atrial fibrillation in 2021. Echocardiogram at the time showed EF 50 to 55%, low normal LV function, no RWMA, G1 DD, normal RV systolic function, mild mitral valve regurgitation.  He has been stable on metoprolol and Eliquis.  She was last seen in the office on 03/16/2023 and was doing well from a cardiac standpoint.  Her blood pressure and heart rate were low.  She noted occasional dizziness, denied palpitations, presyncope or syncope.  She did note recent weight loss, decreased appetite.  Metoprolol was decreased to 12.5 mg daily.  Valsartan-hydrochlorothiazide was discontinued.  She was transitioned to valsartan 160 mg daily.    She presents today for follow-up.   Since her last visit she has been stable from a cardiac standpoint.  She had a preop telephone visit during which her BP was notably elevated.  She self transitioned from  valsartan back to valsartan hydrochlorothiazide.  BP has been well-controlled since this time.  She notes that her PCP stopped her metoprolol in the setting of ongoing bradycardia. She had her colonoscopy and is back on Eliquis.  She denies any chest pain, palpitations, dizziness, presyncope, syncope, edema, PND, orthopnea, weight gain.  She continues to smoke but has cut back significantly.  Overall, she reports feeling well.  Home Medications    Current Outpatient Medications  Medication Sig Dispense Refill   Apoaequorin (PREVAGEN) 10 MG CAPS Take 10 mg by mouth daily.     azelastine (ASTELIN) 0.1 % nasal spray Place 1 spray into both nostrils 2 (two) times daily as needed.      cetirizine (ZYRTEC) 10 MG tablet Take 1 tablet (10 mg total) by mouth daily. (Patient taking differently: Take 10 mg by mouth daily as needed for allergies.) 30 tablet 11   cholecalciferol (VITAMIN D) 25 MCG (1000 UNIT) tablet Take 1,000 Units by mouth daily.     clindamycin (CLEOCIN) 300 MG capsule Take two pills one hour prior to dental appointment 6 capsule 2   ELIQUIS 5 MG TABS tablet TAKE 1 TABLET BY MOUTH TWICE A DAY 180 tablet 3   fluticasone (FLONASE) 50 MCG/ACT nasal spray Place 2 sprays into both nostrils daily. (Patient taking differently: Place 2 sprays into both nostrils daily as needed for allergies or rhinitis.) 16 g 6   latanoprost (XALATAN) 0.005 % ophthalmic solution Place 1 drop into both eyes at bedtime.     valsartan (DIOVAN) 160 MG tablet Take 1 tablet (160 mg total) by mouth daily. 90 tablet 3   atorvastatin (LIPITOR) 40 MG tablet Take 1 tablet (40 mg total) by mouth daily. (Patient not taking: Reported on 04/14/2023) 90 tablet 1   metoprolol succinate (TOPROL-XL) 25 MG 24 hr tablet TAKE 1 TABLET BY MOUTH EVERY DAY (Patient not taking: Reported on 04/14/2023) 90 tablet 3   No current facility-administered medications for this visit.     Review of Systems    She denies chest pain, palpitations, dyspnea, pnd, orthopnea, n, v, dizziness, syncope, edema, weight gain, or early satiety. All other systems reviewed and are otherwise negative except as noted above.   Physical Exam    VS:  BP 110/66   Pulse 66   Ht 5\' 5"  (1.651 m)   Wt 143 lb 12.8 oz (65.2 kg)   SpO2 98%   BMI 23.93 kg/m   GEN: Well nourished, well developed, in no acute distress. HEENT: normal. Neck: Supple, no JVD, carotid bruits, or masses. Cardiac: RRR, no murmurs, rubs, or gallops. No clubbing, cyanosis, edema.  Radials/DP/PT 2+ and equal bilaterally.  Respiratory:  Respirations regular and unlabored, clear to auscultation bilaterally. GI: Soft, nontender, nondistended, BS + x 4. MS: no deformity or  atrophy. Skin: warm and dry, no rash. Neuro:  Strength and sensation are intact. Psych: Normal affect.  Accessory Clinical Findings    ECG personally reviewed by me today - EKG Interpretation Date/Time:  Friday April 14 2023 14:37:13 EDT Ventricular Rate:  66 PR Interval:  172 QRS Duration:  132 QT Interval:  438 QTC Calculation: 459 R Axis:   -67  Text Interpretation: Normal sinus rhythm Right bundle branch block Left anterior fascicular block * Bifascicular block *When compared with ECG of 25-Feb-2021 10:57, Nonspecific T wave abnormality has replaced inverted T waves in Inferior leads Confirmed by Bernadene Person ([id number]) on 04/14/2023 2:54:11 PM  - no acute changes.   Lab Results  Component Value Date   WBC 12.0 (H) 03/09/2021   HGB 11.1 (L) 03/09/2021   HCT 34.3 (L) 03/09/2021   MCV 97.4 03/09/2021   PLT 230 03/09/2021   Lab Results  Component Value Date   CREATININE 0.92 03/09/2021   BUN 23 03/09/2021   NA 141 03/09/2021   K 4.2 03/09/2021   CL 108 03/09/2021   CO2 29 03/09/2021   Lab Results  Component Value Date   ALT 17 02/25/2021   AST 19 02/25/2021   ALKPHOS 40 02/25/2021   BILITOT 0.6 02/25/2021   Lab Results  Component Value Date   CHOL 115 01/13/2020   HDL 31 (L) 01/13/2020   LDLCALC 66 01/13/2020   TRIG 90 01/13/2020   CHOLHDL 3.7 01/13/2020    Lab Results  Component Value Date   HGBA1C 6.0 (H) 01/12/2020    Assessment & Plan    1. CAD: Coronary CTA in 07/2019 revealed coronary calcium score of 1022 (97 percentile), mild to moderate nonobstructive CAD with small vessel disease, negative FFR. Stable with no anginal symptoms. No indication for ischemic evaluation.  No ASA in the setting of chronic DOAC therapy.  Continue valsartan-HCTZ as below, will refer to lipid clinic Pharm.D. as below.   2. Paroxysmal atrial fibrillation/bradycardia: Maintaining sinus rhythm.  Metoprolol was recently discontinued in the setting of significant bradycardia.  HR  stable in office today.  She denies any awareness of recurrent atrial fibrillation, denies any dizziness, presyncope, syncope.  Continue to monitor symptoms, HR.  Continue Eliquis.   3. Hypertension/hypotension: Recent hypotension. Metoprolol was discontinued, she was transitioned from valsartan-HCTZ to valsartan 160 mg daily.  She noted elevated BP and self resumed valsartan hydrochlorothiazide 320-12.5 mg daily.  Will stop valsartan 160 mg daily and continue valsartan hydrochlorothiazide 320-12.5 mg daily.  Continue to monitor BP.    4. Hyperlipidemia: LDL was 96 in 09/2022.  She does not think she was fasting for these labs.  Will repeat fasting lipid panel, CMET.  She stopped taking her Lipitor as she states it was making her feel bad.  Will refer to lipid clinic Pharm.D. for consideration of alternative lipid-lowering therapy as she appears to be statin intolerant.     5. Tobacco use: She has cut back significantly and is trying to quit smoking.  Full cessation advised.  6. Disposition: Follow-up in 4-6 months.      Joylene Grapes, NP 04/14/2023, 2:59 PM

## 2023-04-20 ENCOUNTER — Telehealth: Payer: Self-pay | Admitting: Cardiovascular Disease

## 2023-04-20 DIAGNOSIS — Z1389 Encounter for screening for other disorder: Secondary | ICD-10-CM

## 2023-04-20 NOTE — Telephone Encounter (Signed)
Pt c/o medication issue:  1. Name of Medication: valsartan-hydrochlorothiazide (DIOVAN-HCT) 320-25 MG tablet   2. How are you currently taking this medication (dosage and times per day)?   3. Are you having a reaction (difficulty breathing--STAT)? no  4. What is your medication issue? Calling to say that Dr hammer at [facility] can no longer prescribe her medication. Patient would like to start receiving it from our office.  Please advise

## 2023-04-20 NOTE — Telephone Encounter (Signed)
Spoke to the patient, she is currently taken valsartan-hydrochlorothiazide 320-12.5 mg prescribed by PCP. Pt was advised by PCP he will no longer refill the  medication until her next office visit. Pt stated she will like for Bernadene Person, NP to manager her hypertension. Pt stated she does not need a refill on the medication. Will forward to APP.

## 2023-05-02 MED ORDER — ATORVASTATIN CALCIUM 40 MG PO TABS: 40.0000 mg | ORAL_TABLET | Freq: Every day | ORAL | 0 refills | Status: DC

## 2023-05-02 NOTE — Addendum Note (Signed)
Addended by: Shon Hale on: 05/02/2023 11:10 AM   Modules accepted: Orders

## 2023-05-08 ENCOUNTER — Encounter: Payer: Self-pay | Admitting: Adult Health

## 2023-05-10 ENCOUNTER — Ambulatory Visit: Payer: Medicare HMO | Admitting: Nurse Practitioner

## 2023-05-12 ENCOUNTER — Ambulatory Visit
Admission: RE | Admit: 2023-05-12 | Discharge: 2023-05-12 | Disposition: A | Discharge status: Home / Self Care | Payer: Medicare HMO | Source: Ambulatory Visit | Attending: Adult Health | Admitting: Adult Health

## 2023-05-12 DIAGNOSIS — R911 Solitary pulmonary nodule: Secondary | ICD-10-CM

## 2023-05-15 ENCOUNTER — Ambulatory Visit: Payer: Medicare HMO | Attending: Cardiology | Admitting: Pharmacist Clinician (PhC)/ Clinical Pharmacy Specialist

## 2023-05-15 DIAGNOSIS — E7849 Other hyperlipidemia: Secondary | ICD-10-CM

## 2023-05-15 NOTE — Patient Instructions (Signed)
Your Results:             Your most recent labs Goal  Total Cholesterol 199 < 200  Triglycerides 124 < 150  HDL (happy/good cholesterol) 46 > 40  LDL (lousy/bad cholesterol 131 < 55   Medication changes:  We will start the process to get Nexlizet covered by your insurance.  Once the prior authorization is complete, I will call/send a MyChart message to let you know and confirm pharmacy information.   You will take one tablet daily.  After about 2 weeks, please go to the lab and get blood work done to make sure you are not at risk for gout.    Lab orders:  We want to repeat labs after 2-3 months.  We will send you a lab order to remind you once we get closer to that time.    Mail order pharmacy - [facility] offers a mail order pharmacy   Thank you for choosing [facility]

## 2023-05-15 NOTE — Progress Notes (Unsigned)
Office Visit    Patient Name: Tonya [name] Date of Encounter: 05/17/2023  Primary Care Provider:  Clovis Riley, L.August Saucer, MD Primary Cardiologist:  Reatha Harps, MD  Chief Complaint    Hyperlipidemia   Significant Past Medical History   CAD 10/22 CAC = 1022 (97th percentile)  AF CHADS2-VASc = 5, on Eliquis  HTN Controlled on valsartan hctz  preDM 4/21 A1c 6.0        Allergies  Allergen Reactions   Penicillins Rash    Reaction : unknown   Meloxicam     Other reaction(s): stomach upset    History of Present Illness    Tonya [name] is a 77 y.o. female patient of Dr Flora Lipps, in the office today to discuss options for cholesterol management.    Insurance Carrier:  [employer]   LDL Cholesterol goal:  LDL < 55  Current Medications:  none  Previously tried:  atorvastatin, rosuvastatin, simvastatin - all caused myalgias;   Family Hx: father died heart disease; mother had multiple strokes, died at 79; multiple siblings deceased, but not related to heart disease; 2 children - healthy  Social Hx: Tobacco: trying to quit, down to a few puffs per day Alcohol:  seldom   Diet:  some eating out, fast food, more vegetables at home, variety of meats, some fried chicken; doesn't snack much, occasional cheesecake     Exercise: hasn't since COVID, Had health problems with   Adherence Assessment  Do you ever forget to take your medication? [x] Yes - at times [] No  Do you ever skip doses due to side effects? [] Yes [x] No  Do you have trouble affording your medicines? [] Yes [x] No  Are you ever unable to pick up your medication due to transportation difficulties? [] Yes [x] No  Do you ever stop taking your medications because you don't believe they are helping? [] Yes [] No  Do you check your weight daily? [] Yes [] No     Accessory Clinical Findings   Lab Results  Component Value Date   CHOL 199 04/28/2023   HDL 46 04/28/2023   LDLCALC 131 (H) 04/28/2023   TRIG 124  04/28/2023   CHOLHDL 4.3 04/28/2023    No results found for: "LIPOA"  Lab Results  Component Value Date   ALT 14 04/28/2023   AST 17 04/28/2023   ALKPHOS 60 04/28/2023   BILITOT 0.3 04/28/2023   Lab Results  Component Value Date   CREATININE 0.99 04/28/2023   BUN 24 04/28/2023   NA 144 04/28/2023   K 4.8 04/28/2023   CL 107 (H) 04/28/2023   CO2 23 04/28/2023   Lab Results  Component Value Date   HGBA1C 6.0 (H) 01/12/2020    Home Medications    Current Outpatient Medications  Medication Sig Dispense Refill   Apoaequorin (PREVAGEN) 10 MG CAPS Take 10 mg by mouth daily.     azelastine (ASTELIN) 0.1 % nasal spray Place 1 spray into both nostrils 2 (two) times daily as needed.     cetirizine (ZYRTEC) 10 MG tablet Take 1 tablet (10 mg total) by mouth daily. (Patient taking differently: Take 10 mg by mouth daily as needed for allergies.) 30 tablet 11   cholecalciferol (VITAMIN D) 25 MCG (1000 UNIT) tablet Take 1,000 Units by mouth daily.     clindamycin (CLEOCIN) 300 MG capsule Take two pills one hour prior to dental appointment 6 capsule 2   ELIQUIS 5 MG TABS tablet TAKE 1 TABLET BY MOUTH TWICE A DAY 180 tablet 3  fluticasone (FLONASE) 50 MCG/ACT nasal spray Place 2 sprays into both nostrils daily. (Patient taking differently: Place 2 sprays into both nostrils daily as needed for allergies or rhinitis.) 16 g 6   latanoprost (XALATAN) 0.005 % ophthalmic solution Place 1 drop into both eyes at bedtime.     valsartan-hydrochlorothiazide (DIOVAN-HCT) 320-25 MG tablet Take 1 tablet by mouth daily. 90 tablet 3   No current facility-administered medications for this visit.     Assessment & Plan    Hyperlipidemia Assessment: Patient with ASCVD not at LDL goal of < 55 Most recent LDL 131 on 04/28/23 Not able to tolerate statins secondary to myalgias - atorvastatin, rosuvastatin, simvastatin Reviewed options for lowering LDL cholesterol, including ezetimibe, PCSK-9 inhibitors,  bempedoic acid and inclisiran.  Discussed mechanisms of action, dosing, side effects, potential decreases in LDL cholesterol and costs.  Also reviewed potential options for patient assistance.  Plan: Patient agreeable to starting Nexlizet Repeat labs: Uric acid - after 2 weeks Lipid - after 3 months Liver function - after 3 months    Phillips Hay, PharmD CPP [facility] [address]  [place], [place] [address] [phone]  05/17/2023, 1:04 PM

## 2023-05-17 ENCOUNTER — Encounter: Payer: Self-pay | Admitting: Pharmacist Clinician (PhC)/ Clinical Pharmacy Specialist

## 2023-05-17 ENCOUNTER — Other Ambulatory Visit (HOSPITAL_COMMUNITY): Payer: Self-pay

## 2023-05-17 NOTE — Assessment & Plan Note (Signed)
Assessment: Patient with ASCVD not at LDL goal of < 55 Most recent LDL 131 on 04/28/23 Not able to tolerate statins secondary to myalgias - atorvastatin, rosuvastatin, simvastatin Reviewed options for lowering LDL cholesterol, including ezetimibe, PCSK-9 inhibitors, bempedoic acid and inclisiran.  Discussed mechanisms of action, dosing, side effects, potential decreases in LDL cholesterol and costs.  Also reviewed potential options for patient assistance.  Plan: Patient agreeable to starting Nexlizet Repeat labs: Uric acid - after 2 weeks Lipid - after 3 months Liver function - after 3 months

## 2023-05-17 NOTE — Telephone Encounter (Signed)
Pharmacy Patient Advocate Encounter   Received notification from Physician's Office that prior authorization for NEXLIZET is required/requested.  Per test claim: The current 30 day co-pay is, $30.  No PA needed at this time. This test claim was processed through [facility]- copay amounts may vary at other pharmacies due to pharmacy/plan contracts, or as the patient moves through the different stages of their insurance plan.

## 2023-05-17 NOTE — Telephone Encounter (Signed)
Please do PA for Nexlizet

## 2023-05-18 MED ORDER — NEXLIZET 180-10 MG PO TABS: 1.0000 | ORAL_TABLET | Freq: Every day | ORAL | 0 refills | Status: DC

## 2023-05-18 NOTE — Telephone Encounter (Signed)
Nexlizet ordered, uric acid labs in 2 weeks.

## 2023-06-09 ENCOUNTER — Telehealth: Payer: Self-pay | Admitting: Pharmacist Clinician (PhC)/ Clinical Pharmacy Specialist

## 2023-06-09 NOTE — Telephone Encounter (Signed)
Patient is returning phone call. Patient states she went on vacation recently and only started the medication two days ago.

## 2023-06-09 NOTE — Telephone Encounter (Signed)
Patient called again

## 2023-06-09 NOTE — Telephone Encounter (Signed)
LMOM - patient started Nexlizet in August, needs to have uric acid labs done.

## 2023-06-09 NOTE — Telephone Encounter (Signed)
Spoke to patient, will be going for uric acid lab on Jul 05, 2023

## 2023-07-13 ENCOUNTER — Telehealth: Payer: Self-pay | Admitting: *Deleted

## 2023-07-13 LAB — URIC ACID: Uric Acid: 7.8 mg/dL (ref 3.1–7.9)

## 2023-07-13 NOTE — Telephone Encounter (Signed)
Attempted to return patient's call. Voicemail did not pick up. Will try to call back.

## 2023-07-13 NOTE — Telephone Encounter (Signed)
Patient returned RN's call. 

## 2023-07-13 NOTE — Telephone Encounter (Signed)
No DPR ---Called left message that result are in mychart for patient to review any question once reviewed please contact office

## 2023-07-13 NOTE — Telephone Encounter (Signed)
-----   Message from [place] sent at 07/13/2023  2:07 PM EDT ----- Uric acid level is normal.  MyChart message.  Gerri Spore T. Flora Lipps, MD, [facility]  [facility]  [address] [place], [place] [address] [phone]  2:06 PM

## 2023-07-17 NOTE — Telephone Encounter (Signed)
Spoke to patient and message relayed. No questions at this time.

## 2023-07-24 ENCOUNTER — Other Ambulatory Visit: Payer: Self-pay | Admitting: Pharmacist Clinician (PhC)/ Clinical Pharmacy Specialist

## 2023-07-24 MED ORDER — NEXLIZET 180-10 MG PO TABS: 1.0000 | ORAL_TABLET | Freq: Every day | ORAL | 3 refills | Status: DC

## 2023-07-29 ENCOUNTER — Other Ambulatory Visit: Payer: Self-pay | Admitting: Cardiovascular Disease

## 2023-07-31 NOTE — Telephone Encounter (Signed)
Prescription refill request for Eliquis received. Indication:afib Last office visit:8/24 Scr:0.99  7/24 Age: 77 Weight:65.2  kg  Prescription refilled

## 2023-08-15 ENCOUNTER — Ambulatory Visit: Payer: Medicare HMO | Attending: Nurse Practitioner | Admitting: Nurse Practitioner

## 2023-08-15 ENCOUNTER — Other Ambulatory Visit: Payer: Self-pay

## 2023-08-15 ENCOUNTER — Encounter: Payer: Self-pay | Admitting: Nurse Practitioner

## 2023-08-15 VITALS — BP 124/70 | HR 70 | Ht 65.0 in | Wt 147.6 lb

## 2023-08-15 DIAGNOSIS — I2510 Atherosclerotic heart disease of native coronary artery without angina pectoris: Secondary | ICD-10-CM

## 2023-08-15 DIAGNOSIS — R001 Bradycardia, unspecified: Secondary | ICD-10-CM

## 2023-08-15 DIAGNOSIS — F17200 Nicotine dependence, unspecified, uncomplicated: Secondary | ICD-10-CM

## 2023-08-15 DIAGNOSIS — I480 Paroxysmal atrial fibrillation: Secondary | ICD-10-CM

## 2023-08-15 DIAGNOSIS — E785 Hyperlipidemia, unspecified: Secondary | ICD-10-CM

## 2023-08-15 DIAGNOSIS — I10 Essential (primary) hypertension: Secondary | ICD-10-CM

## 2023-08-15 MED ORDER — VALSARTAN 160 MG PO TABS: 160.0000 mg | ORAL_TABLET | Freq: Every day | ORAL | 3 refills | Status: DC

## 2023-08-15 NOTE — Patient Instructions (Signed)
Medication Instructions:  Stop Valsartan/Hydrochlorothiazide 320-25 mg as directed Start Valsartan 160 mg daily  *If you need a refill on your cardiac medications before your next appointment, please call your pharmacy*   Lab Work: Your physician recommends that you return for lab work mid December 2024. Fasting Lipid panel & LFTs  Testing/Procedures: NONE ordered at this time of appointment     Follow-Up: At [facility], you and your health needs are our priority.  As part of our continuing mission to provide you with exceptional heart care, we have created designated Provider Care Teams.  These Care Teams include your primary Cardiologist (physician) and Advanced Practice Providers (APPs -  Physician Assistants and Nurse Practitioners) who all work together to provide you with the care you need, when you need it.  We recommend signing up for the patient portal called "MyChart".  Sign up information is provided on this After Visit Summary.  MyChart is used to connect with patients for Virtual Visits (Telemedicine).  Patients are able to view lab/test results, encounter notes, upcoming appointments, etc.  Non-urgent messages can be sent to your provider as well.   To learn more about what you can do with MyChart, go to [email or web address].    Your next appointment:   6 month(s)  Provider:   Reatha Harps, MD

## 2023-08-15 NOTE — Progress Notes (Signed)
Office Visit    Patient Name: Tonya [name] Date of Encounter: 08/15/2023  Primary Care Provider:  Clovis Riley, L.August Saucer, MD (Inactive) Primary Cardiologist:  Reatha Harps, MD  Chief Complaint    77 year old female with a history of nonobstructive CAD, paroxysmal atrial fibrillation, hypertension, hyperlipidemia, and tobacco use who presents for follow-up related to CAD.   Past Medical History    Past Medical History:  Diagnosis Date   Allergy    Dysrhythmia    Hyperlipidemia    Hypertension    [name] disease    per her report from skin biopsy prior   Osteoarthritis    knee, hips   Plantar fasciitis    right foot   Tobacco use disorder    Past Surgical History:  Procedure Laterality Date   ABDOMINAL HYSTERECTOMY     partial   CHOLECYSTECTOMY  2003   COLONOSCOPY  2010   TOTAL KNEE ARTHROPLASTY Right 03/08/2021   Procedure: RIGHT TOTAL KNEE ARTHROPLASTY;  Surgeon: Tarry Kos, MD;  Location: [facility] OR;  Service: Orthopedics;  Laterality: Right;   WISDOM TOOTH EXTRACTION      Allergies  Allergies  Allergen Reactions   Penicillins Rash    Reaction : unknown   Meloxicam     Other reaction(s): stomach upset     Labs/Other Studies Reviewed    The following studies were reviewed today:  Cardiac Studies & Procedures       ECHOCARDIOGRAM  ECHOCARDIOGRAM COMPLETE 01/13/2020  Narrative ECHOCARDIOGRAM REPORT    Patient Name:   Tonya [name] Date of Exam: 01/13/2020 Medical Rec #:  [id number]        Height:       65.0 in Accession #:    [id number]       Weight:       155.7 lb Date of Birth:  [birth year]        BSA:          1.778 m Patient Age:    73 years         BP:           103/46 mmHg Patient Gender: F                HR:           51 bpm. Exam Location:  Inpatient  Procedure: 2D Echo, Cardiac Doppler and Color Doppler  Indications:    Atrial Fibrillation 427.31  History:        Patient has no prior history of Echocardiogram  examinations. Arrythmias:Atrial Fibrillation and PVC; Risk Factors:Hypertension, Dyslipidemia and Current Smoker.  Sonographer:    Renella Cunas RDCS Referring Phys: [id number] [facility]  IMPRESSIONS   1. Left ventricular ejection fraction, by estimation, is 50 to 55%. The left ventricle has low normal function. The left ventricle has no regional wall motion abnormalities. Left ventricular diastolic parameters are consistent with Grade I diastolic dysfunction (impaired relaxation). 2. Right ventricular systolic function is normal. The right ventricular size is normal. There is moderately elevated pulmonary artery systolic pressure. 3. Left atrial size was mildly dilated. 4. The mitral valve is normal in structure. Mild mitral valve regurgitation. No evidence of mitral stenosis. 5. Tricuspid valve regurgitation is mild to moderate. 6. The aortic valve is normal in structure. Aortic valve regurgitation is trivial. No aortic stenosis is present. 7. The inferior vena cava is dilated in size with <50% respiratory variability, suggesting right atrial pressure of 15 mmHg.  FINDINGS Left Ventricle: Left ventricular  ejection fraction, by estimation, is 50 to 55%. The left ventricle has low normal function. The left ventricle has no regional wall motion abnormalities. The left ventricular internal cavity size was normal in size. There is no left ventricular hypertrophy. Left ventricular diastolic parameters are consistent with Grade I diastolic dysfunction (impaired relaxation). Normal left ventricular filling pressure.  Right Ventricle: The right ventricular size is normal. No increase in right ventricular wall thickness. Right ventricular systolic function is normal. There is moderately elevated pulmonary artery systolic pressure. The tricuspid regurgitant velocity is 2.73 m/s, and with an assumed right atrial pressure of 15 mmHg, the estimated right ventricular systolic pressure is 44.8  mmHg.  Left Atrium: Left atrial size was mildly dilated.  Right Atrium: Right atrial size was normal in size.  Pericardium: There is no evidence of pericardial effusion.  Mitral Valve: The mitral valve is normal in structure. Normal mobility of the mitral valve leaflets. Mild mitral valve regurgitation. No evidence of mitral valve stenosis.  Tricuspid Valve: The tricuspid valve is normal in structure. Tricuspid valve regurgitation is mild to moderate. No evidence of tricuspid stenosis.  Aortic Valve: The aortic valve is normal in structure. Aortic valve regurgitation is trivial. No aortic stenosis is present.  Pulmonic Valve: The pulmonic valve was normal in structure. Pulmonic valve regurgitation is trivial. No evidence of pulmonic stenosis.  Aorta: The aortic root is normal in size and structure.  Venous: The inferior vena cava is dilated in size with less than 50% respiratory variability, suggesting right atrial pressure of 15 mmHg.  IAS/Shunts: No atrial level shunt detected by color flow Doppler.   LEFT VENTRICLE PLAX 2D LVIDd:         4.30 cm      Diastology LVIDs:         3.20 cm      LV e' lateral:   7.07 cm/s LV PW:         1.00 cm      LV E/e' lateral: 7.0 LV IVS:        1.00 cm      LV e' medial:    5.87 cm/s LVOT diam:     1.90 cm      LV E/e' medial:  8.4 LV SV:         59 LV SV Index:   33 LVOT Area:     2.84 cm  LV Volumes (MOD) LV vol d, MOD A2C: 113.0 ml LV vol d, MOD A4C: 89.2 ml LV vol s, MOD A2C: 57.5 ml LV vol s, MOD A4C: 49.2 ml LV SV MOD A2C:     55.5 ml LV SV MOD A4C:     89.2 ml LV SV MOD BP:      48.2 ml  RIGHT VENTRICLE RV S prime:     9.14 cm/s TAPSE (M-mode): 2.0 cm  LEFT ATRIUM             Index       RIGHT ATRIUM           Index LA diam:        3.20 cm 1.80 cm/m  RA Area:     14.70 cm LA Vol (A2C):   56.1 ml 31.54 ml/m RA Volume:   38.20 ml  21.48 ml/m LA Vol (A4C):   43.8 ml 24.63 ml/m LA Biplane Vol: 50.5 ml 28.40 ml/m AORTIC  VALVE LVOT Vmax:   89.40 cm/s LVOT Vmean:  57.300 cm/s LVOT VTI:  0.209 m  AORTA Ao Root diam: 3.50 cm Ao Asc diam:  3.50 cm  MITRAL VALVE                 TRICUSPID VALVE MV Area (PHT): 2.16 cm      TR Peak grad:   29.8 mmHg MV Decel Time: 352 msec      TR Vmax:        273.00 cm/s MR Peak grad:    108.6 mmHg MR Mean grad:    74.0 mmHg   SHUNTS MR Vmax:         521.00 cm/s Systemic VTI:  0.21 m MR Vmean:        402.0 cm/s  Systemic Diam: 1.90 cm MR PISA:         0.57 cm MR PISA Eff ROA: 4 mm MR PISA Radius:  0.30 cm MV E velocity: 49.50 cm/s MV A velocity: 74.80 cm/s MV E/A ratio:  0.66  Chilton Si MD Electronically signed by Chilton Si MD Signature Date/Time: 01/13/2020/6:13:23 PM    Final     CT SCANS  CT CORONARY MORPH W/CTA COR W/SCORE 07/09/2019  Addendum 07/09/2019  5:50 PM ADDENDUM REPORT: 07/09/2019 17:48  CLINICAL DATA:  Chest pain  EXAM: Cardiac/Coronary CTA  TECHNIQUE: The patient was scanned on a [employer]. A 100 kV prospective scan was triggered in the descending thoracic aorta at 111 HU's. Axial non-contrast 3 mm slices were carried out through the heart. The data set was analyzed on a dedicated work station and scored using the Agatson method. Gantry rotation speed was 250 msecs and collimation was .6 mm. No beta blockade and 0.8 mg of sl NTG was given. The 3D data set was reconstructed in 5% intervals of the 35-75 % of the R-R cycle. Diastolic phases were analyzed on a dedicated work station using MPR, MIP and VRT modes. The patient received 80 cc of contrast.  FINDINGS: Image quality: excellent.  Noise artifact is: Limited.  Coronary Arteries:  Normal coronary origin.  Right dominance.  Left main: The left main is a large caliber vessel with a normal take off from the left coronary cusp that bifurcates to form a left anterior descending artery and a left circumflex artery. There is minimal (<25%) calcified  plaque in the distal LM.  Left anterior descending artery: The proximal LAD contains calcified plaque that is mild in severity (25-49%). The mLAD contains calcified plaque that is mild in severity (<25%). There is a distal myocardial bridge. The distal LAD is patent without plaque or stenosis. The LAD gives off 2 patent diagonal branches.  Left circumflex artery: The LCX is non-dominant and heavily calcified with non-obstructive disease. The ostial LCX contains calcified plaque that is minimal in severity (<25%). The proximal LCX contains minimal calcified plaque (<25%). The mid LCX contains mild calcified plaque (25-49%). The distal LCX is patent without plaque or stenosis. The LCX gives off 1 large patent obtuse marginal system.  Right coronary artery: The RCA is dominant with normal take off from the right coronary cusp. The proximal RCA contains mild calcified plaque (25-49%). The mRCA contains at least mild calcified plaque (25-49%) but unable to exclude a moderate stenosis due to blooming artifact. The distal RCA contains minimal mixed density plaque (<25%). The RCA terminates as a PDA and right posterolateral branch without evidence of plaque or stenosis.  Right Atrium: Right atrial size is within normal limits.  Right Ventricle: The right ventricular cavity is within normal limits.  Left Atrium: Left atrial size is normal in size with no left atrial appendage filling defect.  Left Ventricle: The ventricular cavity size is within normal limits. There are no stigmata of prior infarction. There is no abnormal filling defect.  Pulmonary arteries: Normal in size without proximal filling defect.  Pulmonary veins: Normal pulmonary venous drainage.  Pericardium: Normal thickness with no significant effusion or calcium present.  Cardiac valves: The aortic valve is trileaflet without significant calcification. The mitral valve is normal structure without significant  calcification.  Aorta: Normal caliber. Minimal aortic root calcification. Moderate atherosclerosis noted in the descending aorta.  Extra-cardiac findings: See attached radiology report for non-cardiac structures.  IMPRESSION: 1. Coronary calcium score of 1022. This was 97th percentile for age and sex matched control.  2. Normal coronary origin with right dominance.  3. Mild calcified CAD in the LAD and LCX (25-49%) that is non-obstructive.  4. Likely mild calcified CAD in the mRCA that is non-obstructive but cannot exclude a moderate stenosis.  5. Moderate aortic atherosclerosis.  RECOMMENDATIONS: 1. Consider aggressive risk factor modification.  2. Will send CT-FFR to exclude significant stenosis in the mRCA.  Lennie Odor, MD   Electronically Signed By: Lennie Odor On: 07/09/2019 17:48  Narrative EXAM: OVER-READ INTERPRETATION  CT CHEST  The following report is an over-read performed by radiologist Dr. Genevive Bi of [facility] on 07/09/2019. This over-read does not include interpretation of cardiac or coronary anatomy or pathology. The coronary CTA interpretation by the cardiologist is attached.  COMPARISON:  None.  FINDINGS: Limited view of the lung parenchyma demonstrates no suspicious nodularity. Airways are normal.  Limited view of the mediastinum demonstrates no adenopathy. Esophagus normal.  Limited view of the upper abdomen unremarkable.  Limited view of the skeleton and chest wall is unremarkable.  IMPRESSION: No significant extracardiac findings.  Electronically Signed: By: Genevive Bi M.D. On: 07/09/2019 12:50         Recent Labs: 04/28/2023: ALT 14; BUN 24; Creatinine, Ser 0.99; Potassium 4.8; Sodium 144  Recent Lipid Panel    Component Value Date/Time   CHOL 199 04/28/2023 1435   TRIG 124 04/28/2023 1435   HDL 46 04/28/2023 1435   CHOLHDL 4.3 04/28/2023 1435   CHOLHDL 3.7 01/13/2020 0426   VLDL 18  01/13/2020 0426   LDLCALC 131 (H) 04/28/2023 1435    History of Present Illness    77 year old female with the above past medical history including nonobstructive CAD, paroxysmal atrial fibrillation, hypertension, hyperlipidemia, and tobacco use.   Coronary CTA in 07/2019 revealed coronary calcium score of 1022 (97 percentile), mild to moderate nonobstructive CAD with small vessel disease, negative FFR.  She was diagnosed with atrial fibrillation in 2021. Echocardiogram at the time showed EF 50 to 55%, low normal LV function, no RWMA, G1 DD, normal RV systolic function, mild mitral valve regurgitation.  He has been stable on metoprolol and Eliquis.  At her follow-up visit in 03/2023 she noted blood pressure and heart rate were low.  She noted occasional dizziness, denied palpitations, presyncope or syncope.  She did note recent weight loss, decreased appetite.  Metoprolol was decreased to 12.5 mg daily.  Valsartan-hydrochlorothiazide was discontinued.  She was transitioned to valsartan 160 mg daily.  Metoprolol was subsequently discontinued in the setting of ongoing bradycardia. She was last seen in the office on 04/14/2023 and was stable from a cardiac standpoint.  She saw lipid clinic PharmD on 05/15/2023 and was started on Nexlizet.   She presents  today for follow-up.   Since her last visit she has done well from a cardiac standpoint.  She denies any symptoms concerning for angina, denies palpitations, dizziness, dyspnea, edema with PND, orthopnea, weight gain.  She continues to smoke but has cut back significantly.  She is in the process of looking for a new PCP.  Overall, she reports feeling well.  She has been taking valsartan 160 mg daily.  BP is well-controlled.    Home Medications    Current Outpatient Medications  Medication Sig Dispense Refill   Apoaequorin (PREVAGEN) 10 MG CAPS Take 10 mg by mouth daily.     azelastine (ASTELIN) 0.1 % nasal spray Place 1 spray into both nostrils 2 (two)  times daily as needed.     Bempedoic Acid-Ezetimibe (NEXLIZET) 180-10 MG TABS Take 1 tablet by mouth daily. 90 tablet 3   cetirizine (ZYRTEC) 10 MG tablet Take 1 tablet (10 mg total) by mouth daily. 30 tablet 11   cholecalciferol (VITAMIN D) 25 MCG (1000 UNIT) tablet Take 1,000 Units by mouth daily.     clindamycin (CLEOCIN) 300 MG capsule Take two pills one hour prior to dental appointment 6 capsule 2   ELIQUIS 5 MG TABS tablet TAKE 1 TABLET BY MOUTH TWICE A DAY 180 tablet 3   fluticasone (FLONASE) 50 MCG/ACT nasal spray Place 2 sprays into both nostrils daily. 16 g 6   latanoprost (XALATAN) 0.005 % ophthalmic solution Place 1 drop into both eyes at bedtime.     valsartan (DIOVAN) 160 MG tablet Take 1 tablet (160 mg total) by mouth daily. 90 tablet 3   No current facility-administered medications for this visit.     Review of Systems    She denies chest pain, palpitations, dyspnea, pnd, orthopnea, n, v, dizziness, syncope, edema, weight gain, or early satiety. All other systems reviewed and are otherwise negative except as noted above.   Physical Exam    VS:  BP 124/70   Pulse 70   Ht 5\' 5"  (1.651 m)   Wt 147 lb 9.6 oz (67 kg)   SpO2 97%   BMI 24.56 kg/m  GEN: Well nourished, well developed, in no acute distress. HEENT: normal. Neck: Supple, no JVD, carotid bruits, or masses. Cardiac: RRR, no murmurs, rubs, or gallops. No clubbing, cyanosis, edema.  Radials/DP/PT 2+ and equal bilaterally.  Respiratory:  Respirations regular and unlabored, clear to auscultation bilaterally. GI: Soft, nontender, nondistended, BS + x 4. MS: no deformity or atrophy. Skin: warm and dry, no rash. Neuro:  Strength and sensation are intact. Psych: Normal affect.  Accessory Clinical Findings    ECG personally reviewed by me today - EKG Interpretation Date/Time:  Tuesday August 15 2023 15:00:20 EST Ventricular Rate:  70 PR Interval:  186 QRS Duration:  124 QT Interval:  420 QTC  Calculation: 453 R Axis:   -78  Text Interpretation: Normal sinus rhythm Right bundle branch block Left anterior fascicular block Bifascicular block Septal infarct , age undetermined When compared with ECG of 14-Apr-2023 14:37, No significant change was found Confirmed by Bernadene Person ([id number]) on 08/15/2023 3:33:02 PM  - no acute changes.   Lab Results  Component Value Date   WBC 12.0 (H) 03/09/2021   HGB 11.1 (L) 03/09/2021   HCT 34.3 (L) 03/09/2021   MCV 97.4 03/09/2021   PLT 230 03/09/2021   Lab Results  Component Value Date   CREATININE 0.99 04/28/2023   BUN 24 04/28/2023   NA 144 04/28/2023   K  4.8 04/28/2023   CL 107 (H) 04/28/2023   CO2 23 04/28/2023   Lab Results  Component Value Date   ALT 14 04/28/2023   AST 17 04/28/2023   ALKPHOS 60 04/28/2023   BILITOT 0.3 04/28/2023   Lab Results  Component Value Date   CHOL 199 04/28/2023   HDL 46 04/28/2023   LDLCALC 131 (H) 04/28/2023   TRIG 124 04/28/2023   CHOLHDL 4.3 04/28/2023    Lab Results  Component Value Date   HGBA1C 6.0 (H) 01/12/2020    Assessment & Plan    1. CAD: Coronary CTA in 07/2019 revealed coronary calcium score of 1022 (97 percentile), mild to moderate nonobstructive CAD with small vessel disease, negative FFR. Stable with no anginal symptoms. No indication for ischemic evaluation.  No ASA in the setting of chronic DOAC therapy.  Continue valsartan as below, Nexlizet.    2. Paroxysmal atrial fibrillation/bradycardia: Maintaining sinus rhythm.  Metoprolol was previously discontinued in the setting of significant bradycardia.  Denies any recent palpitations. Continue Eliquis.   3. Hypertension/hypotension: Previously transitioned from valsartan-HCTZ to valsartan 160 mg daily. She subsequently stopped valsartan and resumed valsartan-HCTZ.  She notes that recently she has been taking valsartan 160 mg daily.  BP has been well-controlled.  For now, continue valsartan.   4. Hyperlipidemia: LDL was 131 in  04/2023.  Statin intolerant.  She was recently started on Nexlizet per lipid clinic PharmD.  Uric acid level was stable.  She is due for repeat fasting lipids, LFTs in December 2024, will place order.  Continue Nexlizet.   5. Tobacco use: She has cut back significantly and is trying to quit smoking.  Full cessation advised.   6. Disposition: Follow-up in 6 months with Dr. Flora Lipps.      Joylene Grapes, NP 08/15/2023, 5:28 PM

## 2024-04-15 ENCOUNTER — Telehealth: Payer: Self-pay | Admitting: Physician Assistant

## 2024-04-15 NOTE — Telephone Encounter (Signed)
 Pt called after hours line with questions about her medications.   Questions answered - needing refill on Nexlizet  - states pharmacy is already working on it.

## 2024-07-31 ENCOUNTER — Telehealth: Payer: Self-pay | Admitting: Diagnostic Neuroimaging

## 2024-07-31 NOTE — Telephone Encounter (Signed)
 Appointment details confirmed With [facility]

## 2024-08-03 ENCOUNTER — Other Ambulatory Visit: Payer: Self-pay | Admitting: Cardiovascular Disease

## 2024-08-03 DIAGNOSIS — I480 Paroxysmal atrial fibrillation: Secondary | ICD-10-CM

## 2024-08-05 NOTE — Telephone Encounter (Addendum)
 Pt last saw Damien Braver, NP on 08/15/23, pt has an overdue recall.  Due to see Dr Barbaraann in 02/2024 6 month follow-up.  Last labs 04/28/23 Creat 0.99, pt is overdue for labwork.  Msg sent to schedulers, pt needs follow-up visit and labwork. Will await appt to refill rx to get pt to that follow-up appt.

## 2024-08-08 NOTE — Telephone Encounter (Signed)
 Pt scheduled to see Dr. Burton on 09/27/2024. Placed orders for lab work and put on appointment note pt will needs labs for Eliquis  follow up. Sent in refill for a 3 month supply no refills so that pt does not run out.

## 2024-09-06 ENCOUNTER — Other Ambulatory Visit: Payer: Self-pay | Admitting: Cardiovascular Disease

## 2024-09-07 ENCOUNTER — Other Ambulatory Visit: Payer: Self-pay | Admitting: Nurse Practitioner

## 2024-09-12 ENCOUNTER — Telehealth: Payer: Self-pay | Admitting: Diagnostic Neuroimaging

## 2024-09-12 NOTE — Telephone Encounter (Signed)
 Patient called to get details reason for appointment

## 2024-09-27 ENCOUNTER — Encounter: Payer: Self-pay | Admitting: Cardiovascular Disease

## 2024-09-27 ENCOUNTER — Ambulatory Visit: Attending: Cardiovascular Disease | Admitting: Cardiovascular Disease

## 2024-09-27 VITALS — BP 110/70 | HR 64 | Ht 65.0 in | Wt 142.0 lb

## 2024-09-27 DIAGNOSIS — E785 Hyperlipidemia, unspecified: Secondary | ICD-10-CM | POA: Diagnosis not present

## 2024-09-27 DIAGNOSIS — I480 Paroxysmal atrial fibrillation: Secondary | ICD-10-CM | POA: Diagnosis not present

## 2024-09-27 DIAGNOSIS — I10 Essential (primary) hypertension: Secondary | ICD-10-CM | POA: Diagnosis not present

## 2024-09-27 DIAGNOSIS — I2510 Atherosclerotic heart disease of native coronary artery without angina pectoris: Secondary | ICD-10-CM

## 2024-09-27 NOTE — Progress Notes (Signed)
 " Cardiology Office Note:  .   Date:  09/27/2024  ID:  Tonya [name], DOB [birth year], MRN [id number] PCP: Merilee CROME.Addie, MD (Inactive)  [facility] Providers Cardiologist:  Darryle ONEIDA Decent, MD   History of Present Illness: .    Chief Complaint  Patient presents with   Follow-up         Tonya [name] is a 78 y.o. female with below history who presents for follow-up.   History of Present Illness   Tonya [name] is a 78 year old female with paroxysmal atrial fibrillation, nonobstructive coronary artery disease, hypertension, and hyperlipidemia who presents for follow-up.  She has been feeling 'a little sluggish' recently. No chest pain, dyspnea, palpitations, or irregular heartbeats. Occasionally, she experiences a transient sensation in her chest.  Her history of paroxysmal atrial fibrillation is noted. She is currently taking Eliquis  5 mg twice daily and has not experienced any bruising or bleeding.  Her hypertension is managed with valsartan  160 mg daily. She recalls a past incident of elevated blood pressure, but no recent adjustments to her medication have been made.  For hyperlipidemia, she is on Nexlizet  (bempedoic acid 180 mg) and Zetia (ezetimibe 10 mg) daily. She has had issues with statins in the past and is unsure if her cholesterol has been checked this year.  She smokes approximately half a cigarette a day, especially when upset.           Problem List 1. Atrial fibrillation, paroxysmal  -diagnosed 01/13/2020 -mild troponin elevation due to small vessel dz -CHADSVASC=4 -dx with dehydration/lactic acidosis 2. Non-obstructive CAD -25-49% LAD/LCX -50-69% mRCA -negative FFR  -CAC score 1022 3. HTN 4. Tobacco abuse  5. HLD  -T chol 156, HDL 40, LDL 94, triglycerides 118 -statin intolerant 6. RBBB/LAFB    ROS: All other ROS reviewed and negative. Pertinent positives noted in the HPI.     Studies Reviewed: SABRA   EKG  Interpretation Date/Time:  Friday September 27 2024 15:29:02 EST Ventricular Rate:  64 PR Interval:  186 QRS Duration:  130 QT Interval:  452 QTC Calculation: 466 R Axis:   -65  Text Interpretation: Normal sinus rhythm Right bundle branch block Left anterior fascicular block Bifascicular block Confirmed by Decent Darryle [phone]) on 09/27/2024 3:34:30 PM   TTE 01/13/2020  1. Left ventricular ejection fraction, by estimation, is 50 to 55%. The  left ventricle has low normal function. The left ventricle has no regional  wall motion abnormalities. Left ventricular diastolic parameters are  consistent with Grade I diastolic  dysfunction (impaired relaxation).   2. Right ventricular systolic function is normal. The right ventricular  size is normal. There is moderately elevated pulmonary artery systolic  pressure.   3. Left atrial size was mildly dilated.   4. The mitral valve is normal in structure. Mild mitral valve  regurgitation. No evidence of mitral stenosis.   5. Tricuspid valve regurgitation is mild to moderate.   6. The aortic valve is normal in structure. Aortic valve regurgitation is  trivial. No aortic stenosis is present.   7. The inferior vena cava is dilated in size with <50% respiratory  variability, suggesting right atrial pressure of 15 mmHg.   Physical Exam:   VS:  BP 110/70 (BP Location: Left Arm, Patient Position: Sitting, Cuff Size: Normal)   Pulse 64   Ht 5' 5 (1.651 m)   Wt 142 lb (64.4 kg)   SpO2 98%   BMI 23.63 kg/m  Wt Readings from Last 3 Encounters:  09/27/24 142 lb (64.4 kg)  08/15/23 147 lb 9.6 oz (67 kg)  04/14/23 143 lb 12.8 oz (65.2 kg)    GEN: Well nourished, well developed in no acute distress NECK: No JVD; No carotid bruits CARDIAC: RRR, no murmurs, rubs, gallops RESPIRATORY:  Clear to auscultation without rales, wheezing or rhonchi  ABDOMEN: Soft, non-tender, non-distended EXTREMITIES:  No edema; No deformity  ASSESSMENT AND PLAN: .    Assessment and Plan    Paroxysmal atrial fibrillation No recurrence. Stable on Eliquis . - Continue Eliquis  5 mg BID. - Ordered echocardiogram.  Coronary artery disease, nonobstructive Nonobstructive with elevated coronary calcium  score. - Ordered echocardiogram.  Essential hypertension Well-controlled on current regimen. - Continue valsartan  160 mg daily.  Hyperlipidemia, statin intolerant Statin intolerant. On Nexlizet , benpedoic acid, and Zetia. - Rechecked lipid panel. - Ordered full panel including liver function tests and TSH.  Tobacco use Smokes half a cigarette per day when upset. - Encouraged smoking cessation.                Follow-up: Return in about 1 year (around 09/27/2025).  Signed, Darryle DASEN. Barbaraann, MD, [facility]  [facility]  [address] [place], [place] [address] [phone]  3:50 PM   "

## 2024-09-27 NOTE — Patient Instructions (Signed)
 Medication Instructions:  Your physician recommends that you continue on your current medications as directed. Please refer to the Current Medication list given to you today.  *If you need a refill on your cardiac medications before your next appointment, please call your pharmacy*  Lab Work: At [facility]: CBC, CMP, FLP, TSH  If you have labs (blood work) drawn today and your tests are completely normal, you will receive your results only by: MyChart Message (if you have MyChart) OR A paper copy in the mail If you have any lab test that is abnormal or we need to change your treatment, we will call you to review the results.  Testing/Procedures: Your physician has requested that you have an echocardiogram. Echocardiography is a painless test that uses sound waves to create images of your heart. It provides your doctor with information about the size and shape of your heart and how well your hearts chambers and valves are working. This procedure takes approximately one hour. There are no restrictions for this procedure. Please do NOT wear cologne, perfume, aftershave, or lotions (deodorant is allowed). Please arrive 15 minutes prior to your appointment time.  Please note: We ask at that you not bring children with you during ultrasound (echo/ vascular) testing. Due to room size and safety concerns, children are not allowed in the ultrasound rooms during exams. Our front office staff cannot provide observation of children in our lobby area while testing is being conducted. An adult accompanying a patient to their appointment will only be allowed in the ultrasound room at the discretion of the ultrasound technician under special circumstances. We apologize for any inconvenience.   Follow-Up: At [facility], you and your health needs are our priority.  As part of our continuing mission to provide you with exceptional heart care, our providers are all part of one team.  This team includes  your primary Cardiologist (physician) and Advanced Practice Providers or APPs (Physician Assistants and Nurse Practitioners) who all work together to provide you with the care you need, when you need it.  Your next appointment:   1 year(s)  Provider:   One of our Advanced Practice Providers (APPs): Morse Clause, PA-C  Lamarr Satterfield, NP Miriam Shams, NP  Olivia Pavy, PA-C Josefa Beauvais, NP  Leontine Salen, PA-C Orren Fabry, PA-C  Tatum, PA-C Ernest Dick, NP  Damien Braver, NP Jon Hails, PA-C  Waddell Donath, PA-C    Dayna Dunn, PA-C  Scott Weaver, PA-C Lum Louis, NP Katlyn West, NP Callie Goodrich, PA-C  Xika Zhao, NP Sheng Haley, PA-C    Kathleen Johnson, PA-C    Other Instructions

## 2024-10-06 ENCOUNTER — Other Ambulatory Visit: Payer: Self-pay | Admitting: Nurse Practitioner

## 2024-10-29 ENCOUNTER — Other Ambulatory Visit (HOSPITAL_BASED_OUTPATIENT_CLINIC_OR_DEPARTMENT_OTHER)

## 2024-11-07 ENCOUNTER — Other Ambulatory Visit: Payer: Self-pay | Admitting: Cardiovascular Disease

## 2024-11-07 DIAGNOSIS — I4891 Unspecified atrial fibrillation: Secondary | ICD-10-CM

## 2024-11-07 NOTE — Telephone Encounter (Signed)
 Prescription refill request for Eliquis  received. Indication: afib  Last office visit: 09/27/2024  Scr:  Age: 79 yo  Weight: 64.4 kg   Pt is overdue for blood work.

## 2024-12-02 ENCOUNTER — Ambulatory Visit (HOSPITAL_COMMUNITY)

## 2024-12-25 ENCOUNTER — Ambulatory Visit: Admitting: Diagnostic Neuroimaging
